# Patient Record
Sex: Female | Born: 1940
Health system: Southern US, Community
[De-identification: ages and names within clinical notes are randomized; demographics above are authoritative.]

## PROBLEM LIST (undated history)

## (undated) DIAGNOSIS — C801 Malignant (primary) neoplasm, unspecified: Secondary | ICD-10-CM

## (undated) DIAGNOSIS — B019 Varicella without complication: Secondary | ICD-10-CM

## (undated) DIAGNOSIS — K219 Gastro-esophageal reflux disease without esophagitis: Secondary | ICD-10-CM

## (undated) DIAGNOSIS — J300 Vasomotor rhinitis: Secondary | ICD-10-CM

## (undated) DIAGNOSIS — IMO0002 Reserved for concepts with insufficient information to code with codable children: Secondary | ICD-10-CM

## (undated) DIAGNOSIS — D649 Anemia, unspecified: Secondary | ICD-10-CM

## (undated) DIAGNOSIS — J40 Bronchitis, not specified as acute or chronic: Secondary | ICD-10-CM

## (undated) DIAGNOSIS — M47816 Spondylosis without myelopathy or radiculopathy, lumbar region: Secondary | ICD-10-CM

## (undated) DIAGNOSIS — E079 Disorder of thyroid, unspecified: Secondary | ICD-10-CM

## (undated) DIAGNOSIS — T7840XA Allergy, unspecified, initial encounter: Secondary | ICD-10-CM

## (undated) DIAGNOSIS — E538 Deficiency of other specified B group vitamins: Secondary | ICD-10-CM

## (undated) HISTORY — DX: Disorder of thyroid, unspecified: E07.9

## (undated) HISTORY — DX: Spondylosis without myelopathy or radiculopathy, lumbar region: M47.816

## (undated) HISTORY — DX: Vasomotor rhinitis: J30.0

## (undated) HISTORY — DX: Reserved for concepts with insufficient information to code with codable children: IMO0002

## (undated) HISTORY — DX: Deficiency of other specified B group vitamins: E53.8

## (undated) HISTORY — DX: Allergy, unspecified, initial encounter: T78.40XA

## (undated) HISTORY — DX: Gastro-esophageal reflux disease without esophagitis: K21.9

## (undated) HISTORY — PX: APPENDECTOMY: SHX54

## (undated) HISTORY — DX: Bronchitis, not specified as acute or chronic: J40

## (undated) HISTORY — PX: ABDOMINAL HYSTERECTOMY: SHX81

## (undated) HISTORY — DX: Varicella without complication: B01.9

## (undated) HISTORY — DX: Malignant (primary) neoplasm, unspecified: C80.1

---

## 2004-02-22 LAB — HM COLONOSCOPY

## 2004-11-29 ENCOUNTER — Ambulatory Visit: Payer: Self-pay | Admitting: Unknown Physician Specialty

## 2005-07-05 ENCOUNTER — Ambulatory Visit: Payer: Self-pay | Admitting: Unknown Physician Specialty

## 2006-02-06 ENCOUNTER — Ambulatory Visit: Payer: Self-pay | Admitting: Unknown Physician Specialty

## 2007-02-10 ENCOUNTER — Ambulatory Visit: Payer: Self-pay | Admitting: Unknown Physician Specialty

## 2007-08-05 ENCOUNTER — Ambulatory Visit: Payer: Self-pay | Admitting: Unknown Physician Specialty

## 2007-08-07 ENCOUNTER — Ambulatory Visit: Payer: Self-pay | Admitting: Unknown Physician Specialty

## 2007-08-25 ENCOUNTER — Ambulatory Visit: Payer: Self-pay | Admitting: Unknown Physician Specialty

## 2008-02-12 ENCOUNTER — Ambulatory Visit: Payer: Self-pay | Admitting: Unknown Physician Specialty

## 2008-09-30 DIAGNOSIS — C801 Malignant (primary) neoplasm, unspecified: Secondary | ICD-10-CM

## 2008-09-30 HISTORY — PX: MODIFIED RADICAL MASTECTOMY W/ AXILLARY LYMPH NODE DISSECTION W/ PECTORALIS MINOR PRESERVATION: SHX2044

## 2008-09-30 HISTORY — DX: Malignant (primary) neoplasm, unspecified: C80.1

## 2009-02-16 ENCOUNTER — Ambulatory Visit: Payer: Self-pay | Admitting: Unknown Physician Specialty

## 2009-02-21 ENCOUNTER — Ambulatory Visit: Payer: Self-pay | Admitting: Unknown Physician Specialty

## 2010-03-01 ENCOUNTER — Ambulatory Visit: Payer: Self-pay

## 2011-01-31 LAB — LIPID PANEL
Cholesterol: 198 mg/dL (ref 0–200)
HDL: 58 mg/dL (ref 35–70)

## 2011-01-31 LAB — CBC AND DIFFERENTIAL
HCT: 40 % (ref 36–46)
Hemoglobin: 13.8 g/dL (ref 12.0–16.0)
Neutrophils Absolute: 67 /uL
Platelets: 278 10*3/uL (ref 150–399)
WBC: 5.5 10^3/mL

## 2011-01-31 LAB — TSH: TSH: 1.64 u[IU]/mL (ref 0.41–5.90)

## 2011-10-23 ENCOUNTER — Ambulatory Visit (INDEPENDENT_AMBULATORY_CARE_PROVIDER_SITE_OTHER): Payer: Medicare Other | Admitting: Internal Medicine

## 2011-10-23 ENCOUNTER — Encounter: Payer: Self-pay | Admitting: Internal Medicine

## 2011-10-23 DIAGNOSIS — Z13820 Encounter for screening for osteoporosis: Secondary | ICD-10-CM | POA: Insufficient documentation

## 2011-10-23 DIAGNOSIS — R05 Cough: Secondary | ICD-10-CM

## 2011-10-23 DIAGNOSIS — Z1211 Encounter for screening for malignant neoplasm of colon: Secondary | ICD-10-CM

## 2011-10-23 DIAGNOSIS — J309 Allergic rhinitis, unspecified: Secondary | ICD-10-CM

## 2011-10-23 DIAGNOSIS — E039 Hypothyroidism, unspecified: Secondary | ICD-10-CM

## 2011-10-23 DIAGNOSIS — M545 Low back pain, unspecified: Secondary | ICD-10-CM | POA: Insufficient documentation

## 2011-10-23 DIAGNOSIS — M5126 Other intervertebral disc displacement, lumbar region: Secondary | ICD-10-CM

## 2011-10-23 DIAGNOSIS — E538 Deficiency of other specified B group vitamins: Secondary | ICD-10-CM | POA: Insufficient documentation

## 2011-10-23 DIAGNOSIS — R059 Cough, unspecified: Secondary | ICD-10-CM

## 2011-10-23 NOTE — Progress Notes (Signed)
Subjective:    Patient ID: Tammy Tate, female    DOB: 04/06/1941, 71 y.o.   MRN: [id number]  HPI Tammy Tate is a 71 yr old heathy female who is here to transfer her primary care from Dr. Francia Greaves of [facility]. She has been a patient of Dr. Lin Givens for over 25 years. She has a history of insomnia and hypothyroidism. Her chief complaint is for persistent cough which is aggravated by nocturnal position. The cough has been present for several weeks. He is accompanied by fevers night sweats and weight loss. She was evaluated by Leanne Chang for sinusitis and treated with omeprazole for presumed reflux. She has been taking the omeprazole  for a little less than one week.  The plan is to refer her for an endoscopy if her cough is not resolved after one month of PPI therapy .   the aggressive nature of this followup is due to a family history of esophageal cancer. She does have a she does have a history of chronic congestion and postnasal drip she has had allergy testing and was pan allergic.   she has had prior trials of Allegra Claritin and Zyrtec without significant relief but has not tried Singulair. Her congestion is worse after eating.   Past Medical History  Diagnosis Date  . Chicken pox   . Thyroid disease   . B12 deficiency   . Cancer 2010    DCIS , right   No current outpatient prescriptions on file prior to visit.     Review of Systems  Constitutional: Negative for fever, chills and unexpected weight change.  HENT: Negative for hearing loss, ear pain, nosebleeds, congestion, sore throat, facial swelling, rhinorrhea, sneezing, mouth sores, trouble swallowing, neck pain, neck stiffness, voice change, postnasal drip, sinus pressure, tinnitus and ear discharge.   Eyes: Negative for pain, discharge, redness and visual disturbance.  Respiratory: Positive for cough. Negative for chest tightness, shortness of breath, wheezing and stridor.   Cardiovascular: Negative for chest pain,  palpitations and leg swelling.  Musculoskeletal: Negative for myalgias and arthralgias.  Skin: Negative for color change and rash.  Neurological: Negative for dizziness, weakness, light-headedness and headaches.  Hematological: Negative for adenopathy.   BP 110/69  Pulse 76  Temp(Src) 97.6 F (36.4 C) (Oral)  Ht 5\' 4"  (1.626 m)  Wt 161 lb 8 oz (73.256 kg)  BMI 27.72 kg/m2  SpO2 99%      Objective:   Physical Exam  Constitutional: She is oriented to person, place, and time. She appears well-developed and well-nourished.       Looks younger than her stated age  HENT:  Mouth/Throat: Oropharynx is clear and moist.  Eyes: EOM are normal. Pupils are equal, round, and reactive to light. No scleral icterus.  Neck: Normal range of motion. Neck supple. No JVD present. No thyromegaly present.  Cardiovascular: Normal rate, regular rhythm, normal heart sounds and intact distal pulses.   Pulmonary/Chest: Effort normal and breath sounds normal.  Abdominal: Soft. Bowel sounds are normal. She exhibits no mass. There is no tenderness.  Musculoskeletal: Normal range of motion. She exhibits no edema.  Lymphadenopathy:    She has no cervical adenopathy.  Neurological: She is alert and oriented to person, place, and time.  Skin: Skin is warm and dry.  Psychiatric: She has a normal mood and affect.       Assessment & Plan:   Screening for osteoporosis Last one done in [address],  Results unknown   Lumbago due to  displacement of intervertebral disc Diagnosed several years ago with confirmation by MRI. She has no history of radiculopathy except when she was receiving manipulative treatment by a chiropractor. She has not had any episode of radiating back pain in over a year and has made behavioral modifications to avoid reinjury.   Cough Prior ENT evaluation suggested reflux as the cause of her cough. She will continue a trial of omeprazole for 4 weeks. Reviewed healthy eating habits including  avoiding eating prior to 2 hours before bedtime and elevating the head of the bed if if possible. If her symptoms continue to with daily omeprazole she is agreeable to endoscopy endoscopic evaluation for biopsy to rule out Barrett's esophagus given her family history of esophageal cancer.  Allergic rhinitis With multiple allergies given prior allergy testing and no previous trial of Singulair we discussed a trial of Singulair. He also discussed waiting until he had some samples given the extensive nature of this medication. We'll also give consideration to diagnosis of asthma given her history of allergies and chronic cough. Singulair may be helpful for both conditions.  Hypothyroidism Managed with brand-name only Synthroid on stable doses for many years. Will continue to repeat TSH on an annual basis sooner if she develops symptoms of uncontrolled thyroid disease.    Updated Medication List Outpatient Encounter Prescriptions as of 71/23/2013  Medication Sig Dispense Refill  . Calcium Carbonate-Vitamin D (CALCIUM 600 + D PO) Take by mouth 2 (two) times daily.      Marland Kitchen levothyroxine (SYNTHROID) 88 MCG tablet Take 88 mcg by mouth daily.      . Multiple Vitamin (MULTIVITAMIN) tablet Take 1 tablet by mouth daily.      . Omega-3 Fatty Acids (OMEGA 3 PO) Take by mouth daily.      Marland Kitchen omeprazole (PRILOSEC) 40 MG capsule Take 40 mg by mouth daily.      Marland Kitchen zolpidem (AMBIEN) 10 MG tablet Take 10 mg by mouth at bedtime as needed.

## 2011-10-23 NOTE — Assessment & Plan Note (Addendum)
Diagnosed several years ago with confirmation by MRI. She has no history of radiculopathy except when she was receiving manipulative treatment by a chiropractor. She has not had any episode of radiating back pain in over a year and has made behavioral modifications to avoid reinjury.

## 2011-10-23 NOTE — Assessment & Plan Note (Signed)
Last one done in [address],  Results unknown

## 2011-10-24 ENCOUNTER — Encounter: Payer: Self-pay | Admitting: Internal Medicine

## 2011-10-24 DIAGNOSIS — R05 Cough: Secondary | ICD-10-CM | POA: Insufficient documentation

## 2011-10-24 DIAGNOSIS — R059 Cough, unspecified: Secondary | ICD-10-CM | POA: Insufficient documentation

## 2011-10-24 DIAGNOSIS — J309 Allergic rhinitis, unspecified: Secondary | ICD-10-CM | POA: Insufficient documentation

## 2011-10-24 NOTE — Assessment & Plan Note (Signed)
Managed with brand-name only Synthroid on stable doses for many years. Will continue to repeat TSH on an annual basis sooner if she develops symptoms of uncontrolled thyroid disease.

## 2011-10-24 NOTE — Assessment & Plan Note (Signed)
Prior ENT evaluation suggested reflux as the cause of her cough. She will continue a trial of omeprazole for 4 weeks. Reviewed healthy eating habits including avoiding eating prior to 2 hours before bedtime and elevating the head of the bed if if possible. If her symptoms continue to with daily omeprazole she is agreeable to endoscopy endoscopic evaluation for biopsy to rule out Barrett's esophagus given her family history of esophageal cancer.

## 2011-10-24 NOTE — Patient Instructions (Signed)
When you return we will give Singulair a trial for your cough and allergic rhinitis.

## 2011-10-24 NOTE — Assessment & Plan Note (Signed)
With multiple allergies given prior allergy testing and no previous trial of Singulair we discussed a trial of Singulair. He also discussed waiting until he had some samples given the extensive nature of this medication. We'll also give consideration to diagnosis of asthma given her history of allergies and chronic cough. Singulair may be helpful for both conditions.

## 2011-11-27 ENCOUNTER — Encounter: Payer: Self-pay | Admitting: Internal Medicine

## 2011-11-28 ENCOUNTER — Encounter: Payer: Self-pay | Admitting: Internal Medicine

## 2011-12-05 ENCOUNTER — Telehealth: Payer: Self-pay | Admitting: *Deleted

## 2011-12-05 NOTE — Telephone Encounter (Signed)
Triage Record Num: [id number] Operator: Revonda Humphrey Patient Name: Tammy Tate Call Date & Time: 12/05/2011 12:19:43PM Patient Phone: [phone] PCP: Duncan Dull Patient Gender: Female PCP Fax : [phone] Patient DOB: [birth year] Practice Name: [facility] Reason for Call: Caller: [place]/Patient; PCP: Duncan Dull; CB#: [phone]; ; ; Call regarding Cough/Congestion; Cough, congestion started on Fri 11/30/2011. Started Muciinex D, Vicks to chest. Frequent cough, not able to work, not sleeping well. Dry hacking cough, body aches. Chest discomfort when coughing, now sinus congestion. Sleeping on 2 pillows now. Just feeling like chest tight. Feels warm but unsure of fever since no thermometer. Gave care information for coughs. Office/Giannis Corpuz says no appt today or tomorrow,, suggested sending to [facility]. Protocol(s) Used: Cough - Adult Recommended Outcome per Protocol: See Provider within 24 hours Reason for Outcome: Recurrent episodes of uncontrolled coughing interfering with ability to carry out usual activities or with normal sleep patterns Care Advice: Call EMS 911 if sudden onset or sudden worsening of breathing problems, struggling to breathe, high pitched noise when breathing in (stridor), unable to speak, grasping at throat, or panic/anxiety because of breathing problems. ~ Increase fluids to 8-12 eight oz (1.6 to 2.4 liters) glasses per day, half of them to be water. Soups, popsicles, fruit juices, non-caffeinated sodas (unless restricting sodium intake), jello, broths, decaf teas, etc. are all okay. Warm fluids can be soothing. ~ 12/05/2011 12:41:52PM Page 1 of 1 [email or web address]

## 2011-12-10 ENCOUNTER — Ambulatory Visit: Payer: Medicare Other | Admitting: Internal Medicine

## 2011-12-16 ENCOUNTER — Other Ambulatory Visit: Payer: Self-pay | Admitting: Internal Medicine

## 2011-12-16 MED ORDER — ZOLPIDEM TARTRATE 10 MG PO TABS: 10.0000 mg | ORAL_TABLET | Freq: Every evening | ORAL | Status: DC | PRN

## 2012-02-19 ENCOUNTER — Ambulatory Visit (INDEPENDENT_AMBULATORY_CARE_PROVIDER_SITE_OTHER)
Admission: RE | Admit: 2012-02-19 | Discharge: 2012-02-19 | Disposition: A | Discharge status: Home / Self Care | Payer: Medicare Other | Source: Ambulatory Visit | Attending: Internal Medicine | Admitting: Internal Medicine

## 2012-02-19 ENCOUNTER — Encounter: Payer: Self-pay | Admitting: Internal Medicine

## 2012-02-19 ENCOUNTER — Ambulatory Visit (INDEPENDENT_AMBULATORY_CARE_PROVIDER_SITE_OTHER): Payer: Medicare Other | Admitting: Internal Medicine

## 2012-02-19 VITALS — BP 126/70 | HR 77 | Temp 98.3°F | Resp 14 | Ht 64.0 in | Wt 164.0 lb

## 2012-02-19 DIAGNOSIS — J309 Allergic rhinitis, unspecified: Secondary | ICD-10-CM

## 2012-02-19 DIAGNOSIS — D0511 Intraductal carcinoma in situ of right breast: Secondary | ICD-10-CM

## 2012-02-19 DIAGNOSIS — Z13220 Encounter for screening for lipoid disorders: Secondary | ICD-10-CM

## 2012-02-19 DIAGNOSIS — Z0000 Encounter for general adult medical examination without abnormal findings: Secondary | ICD-10-CM

## 2012-02-19 DIAGNOSIS — K2100 Gastro-esophageal reflux disease with esophagitis, without bleeding: Secondary | ICD-10-CM

## 2012-02-19 DIAGNOSIS — Z853 Personal history of malignant neoplasm of breast: Secondary | ICD-10-CM

## 2012-02-19 DIAGNOSIS — Z1211 Encounter for screening for malignant neoplasm of colon: Secondary | ICD-10-CM

## 2012-02-19 DIAGNOSIS — R61 Generalized hyperhidrosis: Secondary | ICD-10-CM

## 2012-02-19 DIAGNOSIS — R5383 Other fatigue: Secondary | ICD-10-CM

## 2012-02-19 MED ORDER — ZOLPIDEM TARTRATE 10 MG PO TABS: 10.0000 mg | ORAL_TABLET | Freq: Every evening | ORAL | Status: DC | PRN

## 2012-02-19 NOTE — Progress Notes (Signed)
Patient ID: Tammy Tate, female   DOB: 1940/11/29, 71 y.o.   MRN: [id number] The patient is here for annual Medicare wellness examination and management of other chronic and acute problems. Allergic rhinitis currently a problem, not resolving with allegra. Has not trie d zyrtec an dprior treial of nasal srapsy not effetive.      The risk factors are reflected in the social history.  The roster of all physicians providing medical care to patient - is listed in the Snapshot section of the chart.  Activities of daily living:  The patient is 100% independent in all ADLs: dressing, toileting, feeding as well as independent mobility  Home safety : The patient has smoke detectors in the home. They wear seatbelts.  There are no firearms at home. There is no violence in the home.   There is no risks for hepatitis, STDs or HIV. There is no   history of blood transfusion. They have no travel history to infectious disease endemic areas of the world.  The patient has seen their dentist in the last six month. They have seen their eye doctor in the last year. They admit to slight hearing difficulty with regard to whispered voices and some television programs.  They have deferred audiologic testing in the last year.  They do not  have excessive sun exposure. Discussed the need for sun protection: hats, long sleeves and use of sunscreen if there is significant sun exposure.   Diet: the importance of a healthy diet is discussed. They do have a healthy diet.  The benefits of regular aerobic exercise were discussed. She walks 4 times per week ,  20 minutes.   Depression screen: there are no signs or vegative symptoms of depression- irritability, change in appetite, anhedonia, sadness/tearfullness.  Cognitive assessment: the patient manages all their financial and personal affairs and is actively engaged. They could relate day,date,year and events; recalled 2/3 objects at 3 minutes; performed clock-face test  normally.  The following portions of the patient's history were reviewed and updated as appropriate: allergies, current medications, past family history, past medical history,  past surgical history, past social history  and problem list.  Visual acuity was not assessed per patient preference since she has regular follow up with her ophthalmologist. Hearing and body mass index were assessed and reviewed.   During the course of the visit the patient was educated and counseled about appropriate screening and preventive services including : fall prevention , diabetes screening, nutrition counseling, colorectal cancer screening, and recommended immunizations.     BP 126/70  Pulse 77  Temp(Src) 98.3 F (36.8 C) (Oral)  Resp 14  Ht 5\' 4"  (1.626 m)  Wt 164 lb (74.39 kg)  BMI 28.15 kg/m2  SpO2 96%  BP 126/70  Pulse 77  Temp(Src) 98.3 F (36.8 C) (Oral)  Resp 14  Ht 5\' 4"  (1.626 m)  Wt 164 lb (74.39 kg)  BMI 28.15 kg/m2  SpO2 96%  General Appearance:    Alert, cooperative, no distress, appears stated age  Head:    Normocephalic, without obvious abnormality, atraumatic  Eyes:    PERRL, conjunctiva/corneas clear, EOM's intact, fundi    benign, both eyes  Ears:    Normal TM's and external ear canals, both ears  Nose:   Nares normal, septum midline, mucosa normal, no drainage    or sinus tenderness  Throat:   Lips, mucosa, and tongue normal; teeth and gums normal  Neck:   Supple, symmetrical, trachea midline, no adenopathy;  thyroid:  no enlargement/tenderness/nodules; no carotid   bruit or JVD  Back:     Symmetric, no curvature, ROM normal, no CVA tenderness  Lungs:     Clear to auscultation bilaterally, respirations unlabored  Chest Wall:    Bilateral mastectomy scars with reconstruction.   Heart:    Regular rate and rhythm, S1 and S2 normal, no murmur, rub   or gallop  Breast Exam:     No tenderness, masses,. resconstruction noted.  Abdomen:     Soft, non-tender, bowel sounds  active all four quadrants,    no masses, no organomegaly  Genitalia:    Normal external for age. cervix and uterus surgically absent.     Extremities:   Extremities normal, atraumatic, no cyanosis or edema  Pulses:   2+ and symmetric all extremities  Skin:   Skin color, texture, turgor normal, no rashes or lesions  Lymph nodes:   Cervical, supraclavicular, and axillary nodes normal  Neurologic:   CNII-XII intact, normal strength, sensation and reflexes    Throughout   Assessment and Plan  Routine general medical examination at a health care facility   She is s/p bilateral mastectomy with breast reconstruction.  Her mammogramsare done by Dr. Tyrell Antonio due to history of BRCA.  She is s/p total hysterectomy and has no cervix,.  Pelvic exam was done last year by Dr. Lin Givens, so none was done today.   Night sweats New onset.  No LAD on exan. Will screen with CXR.  exam was normal.   Allergic rhinitis Advised to try zyrtec qhs and simply saline sinus lavage.,  If no imprvement,  Trial of singulair vs pataday eye drops for ocular symptoms   Screening for colon cancer Last colonoscopy was normal 2005 except for diverticulosis,  Repeat due 2015, [place].    Updated Medication List Outpatient Encounter Prescriptions as of 02/19/2012  Medication Sig Dispense Refill  . Calcium Carbonate-Vitamin D (CALCIUM 600 + D PO) Take by mouth 2 (two) times daily.      Marland Kitchen levothyroxine (SYNTHROID) 88 MCG tablet Take 88 mcg by mouth daily.      . Multiple Vitamin (MULTIVITAMIN) tablet Take 1 tablet by mouth daily.      . Omega-3 Fatty Acids (OMEGA 3 PO) Take by mouth daily.      Marland Kitchen zolpidem (AMBIEN) 10 MG tablet Take 1 tablet (10 mg total) by mouth at bedtime as needed.  30 tablet  3  . DISCONTD: zolpidem (AMBIEN) 10 MG tablet Take 1 tablet (10 mg total) by mouth at bedtime as needed.  30 tablet  3  . DISCONTD: omeprazole (PRILOSEC) 40 MG capsule Take 40 mg by mouth daily.

## 2012-02-19 NOTE — Assessment & Plan Note (Addendum)
She is s/p bilateral mastectomy with breast reconstruction.  Her mammogramsare done by Dr. Tyrell Antonio due to history of BRCA.  She is s/p total hysterectomy and has no cervix,.  Pelvic exam was done last year by Dr. Lin Givens, so none was done today.

## 2012-02-19 NOTE — Assessment & Plan Note (Signed)
Advised to try zyrtec qhs and simply saline sinus lavage.,  If no imprvement,  Trial of singulair vs pataday eye drops for ocular symptoms

## 2012-02-19 NOTE — Assessment & Plan Note (Signed)
New onset.  will screen with CXR.  erxam was normal.

## 2012-02-19 NOTE — Patient Instructions (Signed)
Return for fasting labs at your leisure.   I have ordered the chest  X ray to be done at [facility]   Sign up for MyChart to get your labs and other results via e mail.

## 2012-02-22 ENCOUNTER — Encounter: Payer: Self-pay | Admitting: Internal Medicine

## 2012-02-22 DIAGNOSIS — D0511 Intraductal carcinoma in situ of right breast: Secondary | ICD-10-CM | POA: Insufficient documentation

## 2012-02-22 DIAGNOSIS — K2100 Gastro-esophageal reflux disease with esophagitis, without bleeding: Secondary | ICD-10-CM | POA: Insufficient documentation

## 2012-02-22 NOTE — Assessment & Plan Note (Signed)
Last colonoscopy was normal 2005 except for diverticulosis,  Repeat due 2015, [place].

## 2012-02-26 ENCOUNTER — Other Ambulatory Visit (INDEPENDENT_AMBULATORY_CARE_PROVIDER_SITE_OTHER): Payer: Medicare Other | Admitting: *Deleted

## 2012-02-26 DIAGNOSIS — R5381 Other malaise: Secondary | ICD-10-CM

## 2012-02-26 DIAGNOSIS — R5383 Other fatigue: Secondary | ICD-10-CM

## 2012-02-26 DIAGNOSIS — Z79899 Other long term (current) drug therapy: Secondary | ICD-10-CM

## 2012-02-26 DIAGNOSIS — Z13220 Encounter for screening for lipoid disorders: Secondary | ICD-10-CM

## 2012-02-26 LAB — CBC WITH DIFFERENTIAL/PLATELET
Basophils Absolute: 0 10*3/uL (ref 0.0–0.1)
Basophils Relative: 0.3 % (ref 0.0–3.0)
Eosinophils Absolute: 0.2 10*3/uL (ref 0.0–0.7)
Hemoglobin: 14.5 g/dL (ref 12.0–15.0)
Lymphocytes Relative: 19.7 % (ref 12.0–46.0)
MCHC: 33.9 g/dL (ref 30.0–36.0)
Monocytes Relative: 9 % (ref 3.0–12.0)
Neutrophils Relative %: 66.4 % (ref 43.0–77.0)
RBC: 4.24 Mil/uL (ref 3.87–5.11)
RDW: 12.9 % (ref 11.5–14.6)

## 2012-02-26 LAB — LIPID PANEL
Cholesterol: 224 mg/dL — ABNORMAL HIGH (ref 0–200)
Total CHOL/HDL Ratio: 3
Triglycerides: 118 mg/dL (ref 0.0–149.0)

## 2012-02-26 LAB — COMPLETE METABOLIC PANEL WITH GFR
ALT: 17 U/L (ref 0–35)
CO2: 26 mEq/L (ref 19–32)
Calcium: 9.1 mg/dL (ref 8.4–10.5)
Chloride: 105 mEq/L (ref 96–112)
Creat: 0.88 mg/dL (ref 0.50–1.10)
GFR, Est African American: 77 mL/min
GFR, Est Non African American: 67 mL/min
Glucose, Bld: 87 mg/dL (ref 70–99)
Total Bilirubin: 0.5 mg/dL (ref 0.3–1.2)

## 2012-02-26 LAB — TSH: TSH: 1.41 u[IU]/mL (ref 0.35–5.50)

## 2012-02-26 LAB — LDL CHOLESTEROL, DIRECT: Direct LDL: 147.5 mg/dL

## 2012-03-13 ENCOUNTER — Other Ambulatory Visit: Payer: Self-pay | Admitting: Internal Medicine

## 2012-03-13 MED ORDER — LEVOTHYROXINE SODIUM 88 MCG PO TABS: 88.0000 ug | ORAL_TABLET | Freq: Every day | ORAL | Status: DC

## 2012-07-21 ENCOUNTER — Telehealth: Payer: Self-pay | Admitting: Internal Medicine

## 2012-07-21 NOTE — Telephone Encounter (Signed)
Refill request for synthroid 88 mcg tab #30 Sig: take 1 tablet every day

## 2012-07-22 ENCOUNTER — Other Ambulatory Visit: Payer: Self-pay | Admitting: *Deleted

## 2012-07-22 MED ORDER — LEVOTHYROXINE SODIUM 88 MCG PO TABS: 88.0000 ug | ORAL_TABLET | Freq: Every day | ORAL | Status: DC

## 2012-07-22 NOTE — Telephone Encounter (Signed)
rx replied to via fax from pharmacy

## 2012-11-06 ENCOUNTER — Encounter: Payer: Self-pay | Admitting: Internal Medicine

## 2012-11-14 ENCOUNTER — Other Ambulatory Visit: Payer: Self-pay

## 2012-12-24 ENCOUNTER — Other Ambulatory Visit: Payer: Self-pay | Admitting: *Deleted

## 2012-12-25 MED ORDER — ZOLPIDEM TARTRATE 10 MG PO TABS: 10.0000 mg | ORAL_TABLET | Freq: Every evening | ORAL | Status: DC | PRN

## 2012-12-25 NOTE — Telephone Encounter (Signed)
Phoned in.

## 2012-12-25 NOTE — Telephone Encounter (Signed)
30 day with 2 refill only,  Needs  1 yr 30 minute follow up appt scheduled by early May

## 2012-12-25 NOTE — Telephone Encounter (Signed)
Patient has an appointment RX sent 12/25/12

## 2013-02-19 ENCOUNTER — Encounter: Payer: Medicare Other | Admitting: Internal Medicine

## 2013-02-25 ENCOUNTER — Ambulatory Visit (INDEPENDENT_AMBULATORY_CARE_PROVIDER_SITE_OTHER): Payer: Medicare Other | Admitting: Internal Medicine

## 2013-02-25 ENCOUNTER — Encounter: Payer: Self-pay | Admitting: Internal Medicine

## 2013-02-25 VITALS — BP 110/72 | HR 71 | Temp 97.7°F | Resp 16 | Ht 63.0 in | Wt 168.2 lb

## 2013-02-25 DIAGNOSIS — E559 Vitamin D deficiency, unspecified: Secondary | ICD-10-CM

## 2013-02-25 DIAGNOSIS — Z79899 Other long term (current) drug therapy: Secondary | ICD-10-CM

## 2013-02-25 DIAGNOSIS — Z1211 Encounter for screening for malignant neoplasm of colon: Secondary | ICD-10-CM

## 2013-02-25 DIAGNOSIS — E538 Deficiency of other specified B group vitamins: Secondary | ICD-10-CM

## 2013-02-25 DIAGNOSIS — E785 Hyperlipidemia, unspecified: Secondary | ICD-10-CM

## 2013-02-25 DIAGNOSIS — Z0000 Encounter for general adult medical examination without abnormal findings: Secondary | ICD-10-CM

## 2013-02-25 DIAGNOSIS — R590 Localized enlarged lymph nodes: Secondary | ICD-10-CM | POA: Insufficient documentation

## 2013-02-25 NOTE — Assessment & Plan Note (Signed)
Taking a b complex vitamin

## 2013-02-25 NOTE — Progress Notes (Signed)
Patient ID: Tammy Tate, female   DOB: 1940/10/02, 72 y.o.   MRN: [id number]  The patient is here for annual Medicare wellness examination and management of other chronic and acute problems.  Was trreated for severe allergies which caused facial edema .  Treated with pred taper and steroid nasal spray , and antihistamine by Erline Hau     The risk factors are reflected in the social history.  The roster of all physicians providing medical care to patient - is listed in the Snapshot section of the chart.  Activities of daily living:  The patient is 100% independent in all ADLs: dressing, toileting, feeding as well as independent mobility  Home safety : The patient has smoke detectors in the home. They wear seatbelts.  There are no firearms at home. There is no violence in the home.   There is no risks for hepatitis, STDs or HIV. There is no   history of blood transfusion. They have no travel history to infectious disease endemic areas of the world.  The patient has seen their dentist in the last six month. They have seen their eye doctor in the last year. They admit to slight hearing difficulty with regard to whispered voices and some television programs.  They have deferred audiologic testing in the last year.  They do not  have excessive sun exposure. Discussed the need for sun protection: hats, long sleeves and use of sunscreen if there is significant sun exposure.   Diet: the importance of a healthy diet is discussed. They do have a healthy diet.  The benefits of regular aerobic exercise were discussed. She walks 4 times per week ,  20 minutes.   Depression screen: there are no signs or vegative symptoms of depression- irritability, change in appetite, anhedonia, sadness/tearfullness.  Cognitive assessment: the patient manages all their financial and personal affairs and is actively engaged. They could relate day,date,year and events; recalled 2/3 objects at 3 minutes; performed  clock-face test normally.  The following portions of the patient's history were reviewed and updated as appropriate: allergies, current medications, past family history, past medical history,  past surgical history, past social history  and problem list.  Visual acuity was not assessed per patient preference since she has regular follow up with her ophthalmologist. Hearing and body mass index were assessed and reviewed.   During the course of the visit the patient was educated and counseled about appropriate screening and preventive services including : fall prevention , diabetes screening, nutrition counseling, colorectal cancer screening, and recommended immunizations.    Objective  BP 110/72  Pulse 71  Temp(Src) 97.7 F (36.5 C) (Oral)  Resp 16  Ht 5\' 3"  (1.6 m)  Wt 168 lb 4 oz (76.318 kg)  BMI 29.81 kg/m2  SpO2 98%  General Appearance:    Alert, cooperative, no distress, appears stated age  Head:    Normocephalic, without obvious abnormality, atraumatic  Eyes:    PERRL, conjunctiva/corneas clear, EOM's intact, fundi    benign, both eyes  Ears:    Normal TM's and external ear canals, both ears  Nose:   Nares normal, septum midline, mucosa normal, no drainage    or sinus tenderness  Throat:   Lips, mucosa, and tongue normal; teeth and gums normal  Neck:   Supple, symmetrical, trachea midline, no adenopathy;    thyroid:  no enlargement/tenderness/nodules; no carotid   bruit or JVD  Back:     Symmetric, no curvature, ROM normal, no CVA tenderness  Lungs:     Clear to auscultation bilaterally, respirations unlabored  Chest Wall:    No tenderness or deformity   Heart:    Regular rate and rhythm, S1 and S2 normal, no murmur, rub   or gallop  Breast Exam:    No tenderness, masses, or nipple abnormality  Abdomen:     Soft, non-tender, bowel sounds active all four quadrants,    no masses, no organomegaly     Extremities:   Extremities normal, atraumatic, no cyanosis or edema   Pulses:   2+ and symmetric all extremities  Skin:   Skin color, texture, turgor normal, no rashes or lesions  Lymph nodes:   Cervical, supraclavicular, and axillary nodes normal  Neurologic:   CNII-XII intact, normal strength, sensation and reflexes    throughout   Assessment and Plan  B12 deficiency Taking a b complex vitamin   Routine general medical examination at a health care facility Non gyn exam was done today  Several shotty tender LNs were noted in the axillary region side.,  we will recheck in 4 weeks   Axillary lymphadenopathy Will recheck in 4 weeks given recent URI  Screening for colon cancer Colonoscopy due next year by [facility].   Updated Medication List Outpatient Encounter Prescriptions as of 02/25/2013  Medication Sig Dispense Refill  . Calcium Carbonate-Vitamin D (CALCIUM 600 + D PO) Take by mouth 2 (two) times daily.      . fluticasone (FLONASE) 50 MCG/ACT nasal spray Place 2 sprays into the nose daily.      Marland Kitchen levothyroxine (SYNTHROID) 88 MCG tablet Take 1 tablet (88 mcg total) by mouth daily.  30 tablet  7  . Multiple Vitamin (MULTIVITAMIN) tablet Take 1 tablet by mouth daily.      Marland Kitchen olopatadine (PATANOL) 0.1 % ophthalmic solution 1 drop 2 (two) times daily.      . Omega-3 Fatty Acids (OMEGA 3 PO) Take by mouth daily.      Marland Kitchen zolpidem (AMBIEN) 10 MG tablet Take 1 tablet (10 mg total) by mouth at bedtime as needed.  30 tablet  2   No facility-administered encounter medications on file as of 02/25/2013.

## 2013-02-25 NOTE — Assessment & Plan Note (Signed)
Will recheck in 4 weeks given recent URI

## 2013-02-25 NOTE — Assessment & Plan Note (Addendum)
Non gyn exam was done today  Several shotty tender LNs were noted in the axillary region side.,  we will recheck in 4 weeks

## 2013-02-25 NOTE — Assessment & Plan Note (Signed)
Colonoscopy due next year by [facility].

## 2013-02-25 NOTE — Patient Instructions (Addendum)
I would like you to return in 1 month for fasting labs and recheck on your lymph nodes

## 2013-03-30 ENCOUNTER — Ambulatory Visit: Payer: Medicare Other | Admitting: Internal Medicine

## 2013-04-06 ENCOUNTER — Ambulatory Visit (INDEPENDENT_AMBULATORY_CARE_PROVIDER_SITE_OTHER): Payer: Medicare Other | Admitting: Internal Medicine

## 2013-04-06 ENCOUNTER — Encounter: Payer: Self-pay | Admitting: Internal Medicine

## 2013-04-06 VITALS — BP 130/72 | HR 72 | Temp 98.2°F | Resp 14 | Wt 171.0 lb

## 2013-04-06 DIAGNOSIS — D0511 Intraductal carcinoma in situ of right breast: Secondary | ICD-10-CM

## 2013-04-06 DIAGNOSIS — E785 Hyperlipidemia, unspecified: Secondary | ICD-10-CM

## 2013-04-06 DIAGNOSIS — R599 Enlarged lymph nodes, unspecified: Secondary | ICD-10-CM

## 2013-04-06 DIAGNOSIS — Z79899 Other long term (current) drug therapy: Secondary | ICD-10-CM

## 2013-04-06 DIAGNOSIS — D0590 Unspecified type of carcinoma in situ of unspecified breast: Secondary | ICD-10-CM

## 2013-04-06 DIAGNOSIS — E559 Vitamin D deficiency, unspecified: Secondary | ICD-10-CM

## 2013-04-06 DIAGNOSIS — E538 Deficiency of other specified B group vitamins: Secondary | ICD-10-CM

## 2013-04-06 DIAGNOSIS — R590 Localized enlarged lymph nodes: Secondary | ICD-10-CM

## 2013-04-06 DIAGNOSIS — Z9013 Acquired absence of bilateral breasts and nipples: Secondary | ICD-10-CM

## 2013-04-06 DIAGNOSIS — Z9010 Acquired absence of unspecified breast and nipple: Secondary | ICD-10-CM

## 2013-04-06 LAB — CBC WITH DIFFERENTIAL/PLATELET
Basophils Relative: 1.1 % (ref 0.0–3.0)
Eosinophils Relative: 4.1 % (ref 0.0–5.0)
Hemoglobin: 15 g/dL (ref 12.0–15.0)
MCHC: 34.3 g/dL (ref 30.0–36.0)
MCV: 101.4 fl — ABNORMAL HIGH (ref 78.0–100.0)
Monocytes Absolute: 0.7 10*3/uL (ref 0.1–1.0)
Neutro Abs: 4.4 10*3/uL (ref 1.4–7.7)
Neutrophils Relative %: 65.5 % (ref 43.0–77.0)
RBC: 4.33 Mil/uL (ref 3.87–5.11)
WBC: 6.8 10*3/uL (ref 4.5–10.5)

## 2013-04-06 LAB — TSH: TSH: 2.36 u[IU]/mL (ref 0.35–5.50)

## 2013-04-06 LAB — COMPREHENSIVE METABOLIC PANEL
Albumin: 4.3 g/dL (ref 3.5–5.2)
Alkaline Phosphatase: 76 U/L (ref 39–117)
BUN: 20 mg/dL (ref 6–23)
Calcium: 9.5 mg/dL (ref 8.4–10.5)
Chloride: 103 mEq/L (ref 96–112)
Creatinine, Ser: 0.9 mg/dL (ref 0.4–1.2)
Glucose, Bld: 96 mg/dL (ref 70–99)
Potassium: 4.7 mEq/L (ref 3.5–5.1)

## 2013-04-06 LAB — LIPID PANEL
Cholesterol: 281 mg/dL — ABNORMAL HIGH (ref 0–200)
Triglycerides: 168 mg/dL — ABNORMAL HIGH (ref 0.0–149.0)

## 2013-04-06 NOTE — Progress Notes (Signed)
Patient ID: Tammy Tate, female   DOB: December 18, 1940, 72 y.o.   MRN: [id number]  Patient Active Problem List   Diagnosis Date Noted  . Axillary lymphadenopathy 02/25/2013  . Esophagitis, reflux 02/22/2012  . DCIS (ductal carcinoma in situ) of breast, right   . Routine general medical examination at a health care facility 02/19/2012  . Night sweats 02/19/2012  . Cough 10/24/2011  . Allergic rhinitis 10/24/2011  . Screening for osteoporosis 10/23/2011  . Screening for colon cancer 10/23/2011  . Hypothyroidism 10/23/2011  . Lumbago due to displacement of intervertebral disc 10/23/2011  . B12 deficiency     Subjective:  CC:   Chief Complaint  Patient presents with  . Follow-up    HPI:   Tammy Tate  a 72 y.o. female who presents for follow up on left axillary and chest wall Lymphadenopathy appreciated last month in the setting of history of breast cancer and recent episode of severe allergies complicated by URI.       Past Medical History  Diagnosis Date  . Chicken pox   . Thyroid disease   . B12 deficiency   . Allergy   . GERD (gastroesophageal reflux disease)   . Vasomotor rhinitis   . Bronchitis     hx of recurrent and bronchospasm  . Degenerative disk disease     KAM 08-04-2007  . Degenerative arthritis of lumbar spine   . Migraine with aura   . Cancer 2010    DCIS , right    Past Surgical History  Procedure Laterality Date  . Appendectomy    . Modified radical mastectomy w/ axillary lymph node dissection w/ pectoralis minor preservation  2010    bilateral, for DCIS,  Doylene Canning , [facility]  . Abdominal hysterectomy      with prior HRT     The following portions of the patient's history were reviewed and updated as appropriate: Allergies, current medications, and problem list.    Review of Systems:   Patient denies headache, fevers, malaise, unintentional weight loss, skin rash, eye pain, sinus congestion and sinus pain, sore throat, dysphagia,  hemoptysis  , cough, dyspnea, wheezing, chest pain, palpitations, orthopnea, edema, abdominal pain, nausea, melena, diarrhea, constipation, flank pain, dysuria, hematuria, urinary  Frequency, nocturia, numbness, tingling, seizures,  Focal weakness, Loss of consciousness,  Tremor, insomnia, depression, anxiety, and suicidal ideation.         History   Social History  . Marital Status: Married    Spouse Name: N/A    Number of Children: N/A  . Years of Education: N/A   Occupational History  . Not on file.   Social History Main Topics  . Smoking status: Former Smoker    Quit date: 10/23/1971  . Smokeless tobacco: Never Used  . Alcohol Use: 4.2 oz/week    7 Glasses of wine per week     Comment: occasional  . Drug Use: No  . Sexually Active: Not on file   Other Topics Concern  . Not on file   Social History Narrative  . No narrative on file    Objective:  BP 130/72  Pulse 72  Temp(Src) 98.2 F (36.8 C) (Oral)  Resp 14  Wt 171 lb (77.565 kg)  BMI 30.3 kg/m2  SpO2 98%  General appearance: alert, cooperative and appears stated age Ears: normal TM's and external ear canals both ears Throat: lips, mucosa, and tongue normal; teeth and gums normal Neck: no adenopathy, no carotid bruit, supple, symmetrical, trachea midline and  thyroid not enlarged, symmetric, no tenderness/mass/nodules Back: symmetric, no curvature. ROM normal. No CVA tenderness. Lungs: clear to auscultation bilaterally Breasts: bilateral total mastectomies with econstruction , linear dimple on right  Heart: regular rate and rhythm, S1, S2 normal, no murmur, click, rub or gallop Abdomen: soft, non-tender; bowel sounds normal; no masses,  no organomegaly Pulses: 2+ and symmetric Skin: Skin color, texture, turgor normal. No rashes or lesions Lymph nodes: Cervical, supraclavicular, and axillary nodes normal.  Assessment and Plan:  Axillary lymphadenopathy Repeat exam of chest wall and axilla on the left di dnot have  lymphadenopathy.  She is s/p bilateral mastectomy with breast reconstruction    Updated Medication List Outpatient Encounter Prescriptions as of 04/06/2013  Medication Sig Dispense Refill  . Calcium Carbonate-Vitamin D (CALCIUM 600 + D PO) Take by mouth 2 (two) times daily.      . fluticasone (FLONASE) 50 MCG/ACT nasal spray Place 2 sprays into the nose daily.      Marland Kitchen levothyroxine (SYNTHROID) 88 MCG tablet Take 1 tablet (88 mcg total) by mouth daily.  30 tablet  7  . Multiple Vitamin (MULTIVITAMIN) tablet Take 1 tablet by mouth daily.      Marland Kitchen olopatadine (PATANOL) 0.1 % ophthalmic solution 1 drop 2 (two) times daily.      . Omega-3 Fatty Acids (OMEGA 3 PO) Take by mouth daily.      Marland Kitchen zolpidem (AMBIEN) 10 MG tablet Take 1 tablet (10 mg total) by mouth at bedtime as needed.  30 tablet  2   No facility-administered encounter medications on file as of 04/06/2013.

## 2013-04-07 ENCOUNTER — Encounter: Payer: Self-pay | Admitting: Internal Medicine

## 2013-04-07 DIAGNOSIS — Z9013 Acquired absence of bilateral breasts and nipples: Secondary | ICD-10-CM | POA: Insufficient documentation

## 2013-04-07 NOTE — Assessment & Plan Note (Signed)
Repeat exam of chest wall and axilla on the left di dnot have lymphadenopathy.  She is s/p bilateral mastectomy with breast reconstruction

## 2013-05-03 ENCOUNTER — Other Ambulatory Visit: Payer: Self-pay | Admitting: *Deleted

## 2013-05-03 MED ORDER — LEVOTHYROXINE SODIUM 88 MCG PO TABS: 88.0000 ug | ORAL_TABLET | Freq: Every day | ORAL | Status: DC

## 2013-05-05 ENCOUNTER — Other Ambulatory Visit: Payer: Self-pay

## 2013-06-09 ENCOUNTER — Other Ambulatory Visit: Payer: Self-pay | Admitting: *Deleted

## 2013-06-11 ENCOUNTER — Telehealth: Payer: Self-pay | Admitting: Internal Medicine

## 2013-06-11 MED ORDER — ZOLPIDEM TARTRATE 10 MG PO TABS: 10.0000 mg | ORAL_TABLET | Freq: Every evening | ORAL | Status: DC | PRN

## 2013-06-11 NOTE — Telephone Encounter (Signed)
Pt states her pharmacy has not gotten a response from [place] regarding pt's Ambien.  Pt only has 1 pill left and it is the weekend.  [facility].

## 2013-06-11 NOTE — Telephone Encounter (Signed)
Script called to pharmacy as requested. 

## 2013-07-02 ENCOUNTER — Encounter: Payer: Self-pay | Admitting: Internal Medicine

## 2013-07-02 ENCOUNTER — Ambulatory Visit: Payer: Medicare Other | Admitting: Internal Medicine

## 2013-07-05 ENCOUNTER — Ambulatory Visit (INDEPENDENT_AMBULATORY_CARE_PROVIDER_SITE_OTHER): Payer: Medicare Other | Admitting: Internal Medicine

## 2013-07-05 ENCOUNTER — Encounter: Payer: Self-pay | Admitting: Internal Medicine

## 2013-07-05 VITALS — BP 128/70 | HR 60 | Temp 97.6°F | Resp 12 | Ht 64.0 in | Wt 173.8 lb

## 2013-07-05 DIAGNOSIS — E538 Deficiency of other specified B group vitamins: Secondary | ICD-10-CM

## 2013-07-05 DIAGNOSIS — E785 Hyperlipidemia, unspecified: Secondary | ICD-10-CM

## 2013-07-05 DIAGNOSIS — E039 Hypothyroidism, unspecified: Secondary | ICD-10-CM

## 2013-07-05 DIAGNOSIS — R590 Localized enlarged lymph nodes: Secondary | ICD-10-CM

## 2013-07-05 DIAGNOSIS — R61 Generalized hyperhidrosis: Secondary | ICD-10-CM

## 2013-07-05 DIAGNOSIS — M25519 Pain in unspecified shoulder: Secondary | ICD-10-CM

## 2013-07-05 DIAGNOSIS — R599 Enlarged lymph nodes, unspecified: Secondary | ICD-10-CM

## 2013-07-05 DIAGNOSIS — Z79899 Other long term (current) drug therapy: Secondary | ICD-10-CM

## 2013-07-05 LAB — VITAMIN B12: Vitamin B-12: 531 pg/mL (ref 211–911)

## 2013-07-05 LAB — CBC WITH DIFFERENTIAL/PLATELET
Basophils Relative: 0.5 % (ref 0.0–3.0)
Eosinophils Relative: 5.3 % — ABNORMAL HIGH (ref 0.0–5.0)
HCT: 41 % (ref 36.0–46.0)
Lymphs Abs: 1.2 10*3/uL (ref 0.7–4.0)
Monocytes Relative: 10.4 % (ref 3.0–12.0)
Neutrophils Relative %: 63.9 % (ref 43.0–77.0)
Platelets: 260 10*3/uL (ref 150.0–400.0)
RBC: 4.12 Mil/uL (ref 3.87–5.11)
WBC: 5.9 10*3/uL (ref 4.5–10.5)

## 2013-07-05 LAB — LDL CHOLESTEROL, DIRECT: Direct LDL: 127.3 mg/dL

## 2013-07-05 LAB — COMPREHENSIVE METABOLIC PANEL
Albumin: 4 g/dL (ref 3.5–5.2)
CO2: 26 mEq/L (ref 19–32)
Calcium: 9.4 mg/dL (ref 8.4–10.5)
GFR: 70.88 mL/min (ref >60.00)
Glucose, Bld: 93 mg/dL (ref 70–99)
Potassium: 5.1 mEq/L (ref 3.5–5.1)
Sodium: 138 mEq/L (ref 135–145)
Total Protein: 6.5 g/dL (ref 6.0–8.3)

## 2013-07-05 NOTE — Patient Instructions (Addendum)
You can use aleve and tylenol for joint pain . (max tylenol is 2000 mg daily )   Labs today to check for rheumatoid

## 2013-07-05 NOTE — Progress Notes (Signed)
Patient ID: Tammy Tate, female   DOB: 02/07/1941, 72 y.o.   MRN: [id number]   Patient Active Problem List   Diagnosis Date Noted  . Pain in joint, shoulder region 07/06/2013  . S/P bilateral mastectomy 04/07/2013  . Axillary lymphadenopathy 02/25/2013  . Esophagitis, reflux 02/22/2012  . DCIS (ductal carcinoma in situ) of breast, right   . Routine general medical examination at a health care facility 02/19/2012  . Night sweats 02/19/2012  . Allergic rhinitis 10/24/2011  . Screening for osteoporosis 10/23/2011  . Screening for colon cancer 10/23/2011  . Hypothyroidism 10/23/2011  . Lumbago due to displacement of intervertebral disc 10/23/2011  . B12 deficiency     Subjective:  CC:   Chief Complaint  Patient presents with  . Acute Visit    Shoulders , wrist bilateral but right wrist is the worse and both shoulders.    HPI:   Tammy Tate a 72 y.o. female who presents for Follow up on chronic and acute issues. She has had increased insomnia and anxiety since her Grand daughter 37 yrs old was diagnosed with ALL last week.  Chemo started last Wednesday after LP, bone marrow and port  placed.  Coming home today   She has been having Recurrent bilateral plantar foot pain , which is worse in the morning when she sets her foot on the floor. She is notable shoulders and her right wrist also been more painful than usual. She has no history,. She does do a considerable amount of yard work which includes burning rosebushes implanting annuals.    Past Medical History  Diagnosis Date  . Chicken pox   . Thyroid disease   . B12 deficiency   . Allergy   . GERD (gastroesophageal reflux disease)   . Vasomotor rhinitis   . Bronchitis     hx of recurrent and bronchospasm  . Degenerative disk disease     K[facility] 08-04-2007  . Degenerative arthritis of lumbar spine   . Migraine with aura   . Cancer 2010    DCIS , right    Past Surgical History  Procedure Laterality Date  .  Appendectomy    . Modified radical mastectomy w/ axillary lymph node dissection w/ pectoralis minor preservation  2010    bilateral, for DCIS,  Tammy Tate , [facility]  . Abdominal hysterectomy      with prior HRT       The following portions of the patient's history were reviewed and updated as appropriate: Allergies, current medications, and problem list.    Review of Systems:   12 Pt  review of systems was negative except those addressed in the HPI,     History   Social History  . Marital Status: Married    Spouse Name: N/A    Number of Children: N/A  . Years of Education: N/A   Occupational History  . Not on file.   Social History Main Topics  . Smoking status: Former Smoker    Quit date: 10/23/1971  . Smokeless tobacco: Never Used  . Alcohol Use: 4.2 oz/week    7 Glasses of wine per week     Comment: occasional  . Drug Use: No  . Sexual Activity: Not on file   Other Topics Concern  . Not on file   Social History Narrative  . No narrative on file    Objective:  Filed Vitals:   07/05/13 1111  BP: 128/70  Pulse: 60  Temp: 97.6 F (36.4 C)  Resp: 12     General appearance: alert, cooperative and appears stated age Ears: normal TM's and external ear canals both ears Throat: lips, mucosa, and tongue normal; teeth and gums normal Neck: no adenopathy, no carotid bruit, supple, symmetrical, trachea midline and thyroid not enlarged, symmetric, no tenderness/mass/nodules Back: symmetric, no curvature. ROM normal. No CVA tenderness. Lungs: clear to auscultation bilaterally Heart: regular rate and rhythm, S1, S2 normal, no murmur, click, rub or gallop Abdomen: soft, non-tender; bowel sounds normal; no masses,  no organomegaly Pulses: 2+ and symmetric Skin: Skin color, texture, turgor normal. No rashes or lesions Lymph nodes: Cervical, supraclavicular, and axillary nodes normal.  Assessment and Plan:  Pain in joint, shoulder region Her range of motion is  mildly limited by pain which occurs in the deltoid region and the scapular region. Given her multiple joint issues suggested screening for rheumatoid arthritis. Continue use of nonsteroidal anti-inflammatories as needed and prophylaxis for gastric irritation with PPI.  Night sweats She was screened for lymphoma with CBC and chest x-ray. Both were normal.  Axillary lymphadenopathy Resolved by repeat exam of chest wall and axilla on the left  Hypothyroidism Thyroid function is WNL on current dose.  No current changes needed.    Updated Medication List Outpatient Encounter Prescriptions as of 07/05/2013  Medication Sig Dispense Refill  . Calcium Carbonate-Vitamin D (CALCIUM 600 + D PO) Take by mouth 2 (two) times daily.      . fluticasone (FLONASE) 50 MCG/ACT nasal spray Place 2 sprays into the nose daily.      Marland Kitchen levothyroxine (SYNTHROID) 88 MCG tablet Take 1 tablet (88 mcg total) by mouth daily.  30 tablet  11  . Multiple Vitamin (MULTIVITAMIN) tablet Take 1 tablet by mouth daily.      . Omega-3 Fatty Acids (OMEGA 3 PO) Take by mouth daily.      Marland Kitchen zolpidem (AMBIEN) 10 MG tablet Take 1 tablet (10 mg total) by mouth at bedtime as needed.  30 tablet  2  . olopatadine (PATANOL) 0.1 % ophthalmic solution 1 drop 2 (two) times daily.       No facility-administered encounter medications on file as of 07/05/2013.     Orders Placed This Encounter  Procedures  . TSH  . Comprehensive metabolic panel  . CBC with Differential  . LDL cholesterol, direct  . Sedimentation rate  . ANA w/Reflex if Positive  . Vitamin B12    No Follow-up on file.

## 2013-07-06 DIAGNOSIS — M7550 Bursitis of unspecified shoulder: Secondary | ICD-10-CM | POA: Insufficient documentation

## 2013-07-06 LAB — TSH: TSH: 1.3 u[IU]/mL (ref 0.35–5.50)

## 2013-07-06 NOTE — Assessment & Plan Note (Signed)
Thyroid function is WNL on current dose.  No current changes needed.  

## 2013-07-06 NOTE — Assessment & Plan Note (Signed)
She was screened for lymphoma with CBC and chest x-ray. Both were normal.

## 2013-07-06 NOTE — Assessment & Plan Note (Signed)
Her range of motion is mildly limited by pain which occurs in the deltoid region and the scapular region. Given her multiple joint issues suggested screening for rheumatoid arthritis. Continue use of nonsteroidal anti-inflammatories as needed and prophylaxis for gastric irritation with PPI.

## 2013-07-06 NOTE — Assessment & Plan Note (Signed)
Resolved by repeat exam of chest wall and axilla on the left

## 2013-07-07 LAB — ANA W/REFLEX IF POSITIVE
Anti Nuclear Antibody(ANA): POSITIVE — AB
Centromere Ab Screen: <0.2 AI (ref 0.0–0.9)
ENA RNP Ab: 0.5 AI (ref 0.0–0.9)
ENA SM Ab Ser-aCnc: <0.2 AI (ref 0.0–0.9)
ENA SSA (RO) Ab: >8 AI — ABNORMAL HIGH (ref 0.0–0.9)
ENA SSB (LA) Ab: <0.2 AI (ref 0.0–0.9)
dsDNA Ab: 7 IU/mL (ref 0–9)

## 2013-07-08 ENCOUNTER — Encounter: Payer: Self-pay | Admitting: Internal Medicine

## 2013-07-08 NOTE — Addendum Note (Signed)
Addended by: Sherlene Shams on: 07/08/2013 06:01 AM   Modules accepted: Orders

## 2013-09-21 ENCOUNTER — Ambulatory Visit (INDEPENDENT_AMBULATORY_CARE_PROVIDER_SITE_OTHER): Payer: Medicare Other | Admitting: Adult Health

## 2013-09-21 ENCOUNTER — Encounter: Payer: Self-pay | Admitting: Adult Health

## 2013-09-21 VITALS — BP 132/78 | HR 112 | Temp 97.8°F | Resp 16 | Wt 172.0 lb

## 2013-09-21 DIAGNOSIS — J209 Acute bronchitis, unspecified: Secondary | ICD-10-CM

## 2013-09-21 MED ORDER — AZITHROMYCIN 250 MG PO TABS: ORAL_TABLET | ORAL | Status: DC

## 2013-09-21 NOTE — Progress Notes (Signed)
Pre visit review using our clinic review tool, if applicable. No additional management support is needed unless otherwise documented below in the visit note. 

## 2013-09-21 NOTE — Assessment & Plan Note (Signed)
Start azithromycin. Also instructed patient to begin using her Flonase. May take over-the-counter cough medication with guaifenesin. RTC if symptoms are not improved by Friday or sooner if necessary.

## 2013-09-21 NOTE — Progress Notes (Signed)
   Subjective:    Patient ID: Tammy Tate, female    DOB: [birth year], 72 y.o.   MRN: [id number]  HPI Patient is a pleasant 72 year old female who presents to clinic with chest congestion, sinus drainage, body discomfort, cough that began on Sunday. She denies fever, shortness of breath. She reports that her granddaughter has been diagnosed with leukemia and will be undergoing treatment in 2 weeks. She reports that she needs to be available to help her daughter during this time. She has been taking over-the-counter medications for her symptoms. She reports taking Cold-Eeze.   / Current Outpatient Prescriptions on File Prior to Visit  Medication Sig Dispense Refill  . Calcium Carbonate-Vitamin D (CALCIUM 600 + D PO) Take by mouth 2 (two) times daily.      . fluticasone (FLONASE) 50 MCG/ACT nasal spray Place 2 sprays into the nose daily.      Marland Kitchen levothyroxine (SYNTHROID) 88 MCG tablet Take 1 tablet (88 mcg total) by mouth daily.  30 tablet  11  . Multiple Vitamin (MULTIVITAMIN) tablet Take 1 tablet by mouth daily.      Marland Kitchen olopatadine (PATANOL) 0.1 % ophthalmic solution 1 drop 2 (two) times daily.      . Omega-3 Fatty Acids (OMEGA 3 PO) Take by mouth daily.      Marland Kitchen zolpidem (AMBIEN) 10 MG tablet Take 1 tablet (10 mg total) by mouth at bedtime as needed.  30 tablet  2   No current facility-administered medications on file prior to visit.    Review of Systems  Constitutional: Positive for fatigue. Negative for fever and chills.  HENT: Positive for congestion, postnasal drip and sinus pressure. Negative for sore throat.   Respiratory: Positive for cough and chest tightness. Negative for shortness of breath and wheezing.   Cardiovascular: Negative for chest pain.  Gastrointestinal: Positive for nausea.       Nausea 2/2 post nasal drip  Neurological: Negative.        Objective:   Physical Exam  Constitutional: She is oriented to person, place, and time. She appears well-developed and  well-nourished. No distress.  HENT:  Right Ear: External ear normal.  Left Ear: External ear normal.  Erythematous pharynx. No exudate  Cardiovascular: Regular rhythm and normal heart sounds.   Tachycardia  Pulmonary/Chest: Effort normal and breath sounds normal. No respiratory distress. She has no wheezes. She has no rales.  Lymphadenopathy:    She has no cervical adenopathy.  Neurological: She is alert and oriented to person, place, and time.  Psychiatric: She has a normal mood and affect. Her behavior is normal. Judgment and thought content normal.          Assessment & Plan:

## 2013-09-21 NOTE — Patient Instructions (Signed)
  Start the Azithromycin 2 tablets today then 1 tablet daily for the next 4 days.  Take an over the counter cough medication that contains guaifenesin to help liquify secretion and make it easier to get rid of them.  Drink plenty of water.  You can take an over the counter medication for your symptoms.  Restart the flonase.  Call if no improvement by Friday or sooner if necessary.

## 2013-09-24 ENCOUNTER — Ambulatory Visit: Payer: Medicare Other | Admitting: Adult Health

## 2013-09-27 ENCOUNTER — Ambulatory Visit (INDEPENDENT_AMBULATORY_CARE_PROVIDER_SITE_OTHER): Payer: Medicare Other | Admitting: Internal Medicine

## 2013-09-27 ENCOUNTER — Encounter: Payer: Self-pay | Admitting: Internal Medicine

## 2013-09-27 VITALS — BP 110/70 | HR 58 | Temp 97.8°F | Ht 64.0 in | Wt 170.5 lb

## 2013-09-27 DIAGNOSIS — J329 Chronic sinusitis, unspecified: Secondary | ICD-10-CM

## 2013-09-27 DIAGNOSIS — R42 Dizziness and giddiness: Secondary | ICD-10-CM

## 2013-09-27 MED ORDER — CEFDINIR 300 MG PO CAPS: 300.0000 mg | ORAL_CAPSULE | Freq: Two times a day (BID) | ORAL | Status: DC

## 2013-09-27 NOTE — Progress Notes (Signed)
Pre-visit discussion using our clinic review tool. No additional management support is needed unless otherwise documented below in the visit note.  

## 2013-09-27 NOTE — Patient Instructions (Signed)
Afrin nasal spray - 2 sprays each nostril 2x/day for three days and then stop.  Flonase nasal spray - 2 sprays each nostril one time per day.  Do this in the evening.  Mucinex in the am and Robitussin in the evening.

## 2013-09-29 ENCOUNTER — Encounter: Payer: Self-pay | Admitting: Internal Medicine

## 2013-09-29 DIAGNOSIS — J329 Chronic sinusitis, unspecified: Secondary | ICD-10-CM | POA: Insufficient documentation

## 2013-09-29 DIAGNOSIS — R42 Dizziness and giddiness: Secondary | ICD-10-CM | POA: Insufficient documentation

## 2013-09-29 NOTE — Assessment & Plan Note (Signed)
Symptoms and exam as outlined.  Recently evaluated and felt to have acute bronchitis.  Already treated with zpak.  With persistent symptoms and progressive symptoms - now c/w sinusitis.  Treat with omnicef as directed.  Afrin nasal spray as directed for 3 days only.  Flonase nasal spray as directed.  Mucinex in the am and Robitussin in the evening.  Rest.  Fluids.  Explained to her if her symptoms worsened or did not resolve, she was to be revaluated.

## 2013-09-29 NOTE — Assessment & Plan Note (Signed)
Symptoms as outlined.  Not severe.  States she had this occur last year with a sinus infection.  I feel the light headedness is related to the current infection.  Treat as outlined.  Follow closely.  If persistent or worsening problems, she is to be reevaluated.

## 2013-09-29 NOTE — Progress Notes (Signed)
Subjective:    Patient ID: Tammy Tate, female    DOB: [birth year], 72 y.o.   MRN: [id number]  HPI 72 year old female with past history of GERD and hypothyroidism who comes in today as a work in with concerns regarding persistent congestion and sinus pressure.  She saw Raquel Rey on 09/21/13 and was placed on Zpak.  Took this medication.  States she still has some chest congestion, but is loosening.  Has now developed increased sinus pressure and nasal congestion.  Yellow mucus production.  Sore throat.  Some dizziness.  Feels like she is in a barrell.  She is concerned about the persistent infection, because she is going to need to take care of her granddaughter.  States her granddaughter has been diagnosed with ALL and is undergoing treatment.  She is coming home from the hospital next week and she needs to be well (to help take care of her).  She is eating and drinking.  No vomiting.     Past Medical History  Diagnosis Date  . Chicken pox   . Thyroid disease   . B12 deficiency   . Allergy   . GERD (gastroesophageal reflux disease)   . Vasomotor rhinitis   . Bronchitis     hx of recurrent and bronchospasm  . Degenerative disk disease     KAM 08-04-2007  . Degenerative arthritis of lumbar spine   . Migraine with aura   . Cancer 2010    DCIS , right    Current Outpatient Prescriptions on File Prior to Visit  Medication Sig Dispense Refill  . Calcium Carbonate-Vitamin D (CALCIUM 600 + D PO) Take by mouth 2 (two) times daily.      . fluticasone (FLONASE) 50 MCG/ACT nasal spray Place 2 sprays into the nose daily.      Marland Kitchen levothyroxine (SYNTHROID) 88 MCG tablet Take 1 tablet (88 mcg total) by mouth daily.  30 tablet  11  . Multiple Vitamin (MULTIVITAMIN) tablet Take 1 tablet by mouth daily.      Marland Kitchen olopatadine (PATANOL) 0.1 % ophthalmic solution 1 drop 2 (two) times daily.      . Omega-3 Fatty Acids (OMEGA 3 PO) Take by mouth daily.      Marland Kitchen zolpidem (AMBIEN) 10 MG tablet Take 1 tablet  (10 mg total) by mouth at bedtime as needed.  30 tablet  2   No current facility-administered medications on file prior to visit.    Review of Systems Patient denies any significant headache.  Some light headedness as outlined.  No significant dizziness.  Increased sinus pressure and nasal congestion productive of yellow mucus.   No chest pain, tightness or palpitations.  No increased shortness of breath.  Does report the increased cough and congestion as outlined.   No nausea or vomiting.  Tolerating po's.   No bowel change, such as diarrhea.        Objective:   Physical Exam Filed Vitals:   09/27/13 1455  BP: 110/70  Pulse: 58  Temp: 97.8 F (34.77 C)   72 year old female in no acute distress.   HEENT:  Nares- erythematous turbinates.  Some tenderness to palpation over the maxillary sinuses.   Oropharynx - without lesions.  TMs visualized - without erythema.   NECK:  Supple.  Nontender.   HEART:  Appears to be regular. LUNGS:  No crackles or wheezing audible.  Respirations even and unlabored.         Assessment & Plan:

## 2013-12-15 ENCOUNTER — Encounter: Payer: Self-pay | Admitting: Internal Medicine

## 2013-12-15 DIAGNOSIS — E119 Type 2 diabetes mellitus without complications: Secondary | ICD-10-CM

## 2013-12-16 NOTE — Telephone Encounter (Signed)
Per patient e m ail   [employer] required this

## 2013-12-23 ENCOUNTER — Encounter: Payer: Self-pay | Admitting: Emergency Medicine

## 2014-02-03 ENCOUNTER — Other Ambulatory Visit: Payer: Self-pay | Admitting: *Deleted

## 2014-02-03 NOTE — Telephone Encounter (Signed)
Upcoming appt 03/02/14

## 2014-02-07 ENCOUNTER — Other Ambulatory Visit: Payer: Self-pay | Admitting: *Deleted

## 2014-02-07 MED ORDER — ZOLPIDEM TARTRATE 10 MG PO TABS: 10.0000 mg | ORAL_TABLET | Freq: Every evening | ORAL | Status: DC | PRN

## 2014-02-11 ENCOUNTER — Telehealth: Payer: Self-pay | Admitting: *Deleted

## 2014-02-11 DIAGNOSIS — E039 Hypothyroidism, unspecified: Secondary | ICD-10-CM

## 2014-02-11 DIAGNOSIS — R5383 Other fatigue: Secondary | ICD-10-CM

## 2014-02-11 DIAGNOSIS — E785 Hyperlipidemia, unspecified: Secondary | ICD-10-CM

## 2014-02-11 DIAGNOSIS — R5381 Other malaise: Secondary | ICD-10-CM

## 2014-02-11 NOTE — Telephone Encounter (Signed)
Pt is coming in on Tuesday what labs and dX?

## 2014-02-22 ENCOUNTER — Other Ambulatory Visit (INDEPENDENT_AMBULATORY_CARE_PROVIDER_SITE_OTHER): Payer: Commercial Managed Care - HMO

## 2014-02-22 DIAGNOSIS — E039 Hypothyroidism, unspecified: Secondary | ICD-10-CM

## 2014-02-22 DIAGNOSIS — Z1211 Encounter for screening for malignant neoplasm of colon: Secondary | ICD-10-CM

## 2014-02-22 DIAGNOSIS — E785 Hyperlipidemia, unspecified: Secondary | ICD-10-CM

## 2014-02-22 DIAGNOSIS — R5381 Other malaise: Secondary | ICD-10-CM

## 2014-02-22 DIAGNOSIS — R5383 Other fatigue: Principal | ICD-10-CM

## 2014-02-22 LAB — COMPREHENSIVE METABOLIC PANEL
ALBUMIN: 3.7 g/dL (ref 3.5–5.2)
ALT: 20 U/L (ref 0–35)
AST: 24 U/L (ref 0–37)
Alkaline Phosphatase: 62 U/L (ref 39–117)
BUN: 17 mg/dL (ref 6–23)
CALCIUM: 9.1 mg/dL (ref 8.4–10.5)
CHLORIDE: 105 meq/L (ref 96–112)
CO2: 28 meq/L (ref 19–32)
Creatinine, Ser: 1 mg/dL (ref 0.4–1.2)
GFR: 57.2 mL/min — AB (ref >60.00)
GLUCOSE: 95 mg/dL (ref 70–99)
POTASSIUM: 4.5 meq/L (ref 3.5–5.1)
SODIUM: 141 meq/L (ref 135–145)
TOTAL PROTEIN: 6.6 g/dL (ref 6.0–8.3)
Total Bilirubin: 0.8 mg/dL (ref 0.2–1.2)

## 2014-02-22 LAB — CBC WITH DIFFERENTIAL/PLATELET
BASOS PCT: 0.5 % (ref 0.0–3.0)
Basophils Absolute: 0 10*3/uL (ref 0.0–0.1)
EOS ABS: 0.3 10*3/uL (ref 0.0–0.7)
EOS PCT: 4.5 % (ref 0.0–5.0)
HCT: 41.9 % (ref 36.0–46.0)
Hemoglobin: 14.3 g/dL (ref 12.0–15.0)
LYMPHS PCT: 17.1 % (ref 12.0–46.0)
Lymphs Abs: 1.2 10*3/uL (ref 0.7–4.0)
MCHC: 34.2 g/dL (ref 30.0–36.0)
MCV: 100.8 fl — ABNORMAL HIGH (ref 78.0–100.0)
MONO ABS: 0.7 10*3/uL (ref 0.1–1.0)
Monocytes Relative: 10.6 % (ref 3.0–12.0)
Neutro Abs: 4.6 10*3/uL (ref 1.4–7.7)
Neutrophils Relative %: 67.3 % (ref 43.0–77.0)
PLATELETS: 304 10*3/uL (ref 150.0–400.0)
RBC: 4.16 Mil/uL (ref 3.87–5.11)
RDW: 12.8 % (ref 11.5–15.5)
WBC: 6.8 10*3/uL (ref 4.0–10.5)

## 2014-02-22 LAB — LIPID PANEL
Cholesterol: 231 mg/dL — ABNORMAL HIGH (ref 0–200)
HDL: 58.7 mg/dL (ref >39.00)
LDL CALC: 137 mg/dL — AB (ref 0–99)
TRIGLYCERIDES: 178 mg/dL — AB (ref 0.0–149.0)
Total CHOL/HDL Ratio: 4
VLDL: 35.6 mg/dL (ref 0.0–40.0)

## 2014-02-22 LAB — TSH: TSH: 2.23 u[IU]/mL (ref 0.35–4.50)

## 2014-02-24 ENCOUNTER — Encounter: Payer: Self-pay | Admitting: Internal Medicine

## 2014-03-02 ENCOUNTER — Encounter: Payer: Self-pay | Admitting: Internal Medicine

## 2014-03-02 ENCOUNTER — Ambulatory Visit (INDEPENDENT_AMBULATORY_CARE_PROVIDER_SITE_OTHER): Payer: Commercial Managed Care - HMO | Admitting: Internal Medicine

## 2014-03-02 VITALS — BP 118/72 | HR 68 | Temp 97.6°F | Resp 16 | Ht 64.0 in | Wt 168.5 lb

## 2014-03-02 DIAGNOSIS — R599 Enlarged lymph nodes, unspecified: Secondary | ICD-10-CM

## 2014-03-02 DIAGNOSIS — R590 Localized enlarged lymph nodes: Secondary | ICD-10-CM

## 2014-03-02 DIAGNOSIS — D7589 Other specified diseases of blood and blood-forming organs: Secondary | ICD-10-CM

## 2014-03-02 DIAGNOSIS — K2100 Gastro-esophageal reflux disease with esophagitis, without bleeding: Secondary | ICD-10-CM

## 2014-03-02 DIAGNOSIS — M75100 Unspecified rotator cuff tear or rupture of unspecified shoulder, not specified as traumatic: Secondary | ICD-10-CM

## 2014-03-02 DIAGNOSIS — Z1211 Encounter for screening for malignant neoplasm of colon: Secondary | ICD-10-CM

## 2014-03-02 DIAGNOSIS — M7550 Bursitis of unspecified shoulder: Secondary | ICD-10-CM

## 2014-03-02 DIAGNOSIS — Z978 Presence of other specified devices: Secondary | ICD-10-CM

## 2014-03-02 DIAGNOSIS — Z13820 Encounter for screening for osteoporosis: Secondary | ICD-10-CM

## 2014-03-02 DIAGNOSIS — Z9889 Other specified postprocedural states: Secondary | ICD-10-CM

## 2014-03-02 DIAGNOSIS — Z0000 Encounter for general adult medical examination without abnormal findings: Secondary | ICD-10-CM

## 2014-03-02 DIAGNOSIS — E039 Hypothyroidism, unspecified: Secondary | ICD-10-CM

## 2014-03-02 DIAGNOSIS — IMO0002 Reserved for concepts with insufficient information to code with codable children: Secondary | ICD-10-CM

## 2014-03-02 DIAGNOSIS — Z23 Encounter for immunization: Secondary | ICD-10-CM

## 2014-03-02 DIAGNOSIS — E663 Overweight: Secondary | ICD-10-CM

## 2014-03-02 MED ORDER — ZOLPIDEM TARTRATE 10 MG PO TABS: 10.0000 mg | ORAL_TABLET | Freq: Every evening | ORAL | Status: DC | PRN

## 2014-03-02 MED ORDER — AMOXICILLIN 500 MG PO CAPS: ORAL_CAPSULE | ORAL | Status: DC

## 2014-03-02 NOTE — Progress Notes (Signed)
Patient ID: Tammy Tate, female   DOB: June 26, 1941, 73 y.o.   MRN: [id number]   The patient is here for annual Medicare wellness examination and management of other chronic and acute problems.   The risk factors are reflected in the social history.  The roster of all physicians providing medical care to patient - is listed in the Snapshot section of the chart.  Activities of daily living:  The patient is 100% independent in all ADLs: dressing, toileting, feeding as well as independent mobility  Home safety : The patient has smoke detectors in the home. They wear seatbelts.  There are no firearms at home. There is no violence in the home.   There is no risks for hepatitis, STDs or HIV. There is no   history of blood transfusion. They have no travel history to infectious disease endemic areas of the world.  The patient has seen their dentist in the last six month. They have seen their eye doctor in the last year. They admit to slight hearing difficulty with regard to whispered voices and some television programs.  They have deferred audiologic testing in the last year.  They do not  have excessive sun exposure. Discussed the need for sun protection: hats, long sleeves and use of sunscreen if there is significant sun exposure.   Diet: the importance of a healthy diet is discussed. They do have a healthy diet.  The benefits of regular aerobic exercise were discussed. She walks 4 times per week ,  20 minutes.   Depression screen: there are no signs or vegative symptoms of depression- irritability, change in appetite, anhedonia, sadness/tearfullness.  Cognitive assessment: the patient manages all their financial and personal affairs and is actively engaged. They could relate day,date,year and events; recalled 2/3 objects at 3 minutes; performed clock-face test normally.  The following portions of the patient's history were reviewed and updated as appropriate: allergies, current medications, past  family history, past medical history,  past surgical history, past social history  and problem list.  Visual acuity was not assessed per patient preference since she has regular follow up with her ophthalmologist. Hearing and body mass index were assessed and reviewed.   During the course of the visit the patient was educated and counseled about appropriate screening and preventive services including : fall prevention , diabetes screening, nutrition counseling, colorectal cancer screening, and recommended immunizations.    Objective:  BP 118/72  Pulse 68  Temp(Src) 97.6 F (36.4 C) (Oral)  Resp 16  Ht 5\' 4"  (1.626 m)  Wt 168 lb 8 oz (76.431 kg)  BMI 28.91 kg/m2  SpO2 98%  General appearance: alert, cooperative and appears stated age Head: Normocephalic, without obvious abnormality, atraumatic Eyes: conjunctivae/corneas clear. PERRL, EOM's intact. Fundi benign. Ears: normal TM's and external ear canals both ears Nose: Nares normal. Septum midline. Mucosa normal. No drainage or sinus tenderness. Throat: lips, mucosa, and tongue normal; teeth and gums normal Neck: no adenopathy, no carotid bruit, no JVD, supple, symmetrical, trachea midline and thyroid not enlarged, symmetric, no tenderness/mass/nodules Lungs: clear to auscultation bilaterally Breasts: bilateral mastectomies with reconstruction , no masses or tenderness. No axillary adenopathy Heart: regular rate and rhythm, S1, S2 normal, no murmur, click, rub or gallop Abdomen: soft, non-tender; bowel sounds normal; no masses,  no organomegaly Extremities: extremities normal, atraumatic, no cyanosis or edema Pulses: 2+ and symmetric Skin: Skin color, texture, turgor normal. No rashes or lesions Neurologic: Alert and oriented X 3, normal strength and tone. Normal symmetric  reflexes. Normal coordination and gait.   Assessment and Plan:  Esophagitis, reflux diagnosed by endo Nov 2008, controlled with PPI.   asymptomatic  Hypothyroidism Thyroid function is WNL on current dose.  No current changes needed.  Lab Results  Component Value Date   TSH 2.23 02/22/2014       Axillary lymphadenopathy Resolved   Screening for colon cancer She is due for colonoscopy. Referral made   Macrocytosis without anemia Secondary to B12 deficiency,  Reminded to resume supplementation  Screening for osteoporosis Last screening was in 2012 by [facility] rheumatologist [place]  S/P breast reconstruction, bilateral Exam done today .  Patient voiced concerns about transient bacteremia from teeth cleaning and other invasive procedures increasing her risk of cellulitis of the breast and axillar based on the experience of a friend who suffered this phenomenom.  Brief discussion about the risks of benefits of prophylactic abx.   Routine general medical examination at a health care facility Annual comprehensive exam was done including breast exam .  All screenings have been addressed .   Overweight (BMI 25.0-29.9) I have addressed  BMI and recommended a low glycemic index diet utilizing smaller more frequent meals to increase metabolism.  I have also recommended that patient start exercising with a goal of 30 minutes of aerobic exercise a minimum of 5 days per week. Screening for lipid disorders, thyroid and diabetes to be done today.     Updated Medication List Outpatient Encounter Prescriptions as of 03/02/2014  Medication Sig  . Calcium Carbonate-Vitamin D (CALCIUM 600 + D PO) Take by mouth 2 (two) times daily.  . fluticasone (FLONASE) 50 MCG/ACT nasal spray Place 2 sprays into the nose daily.  Marland Kitchen levothyroxine (SYNTHROID) 88 MCG tablet Take 1 tablet (88 mcg total) by mouth daily.  . Multiple Vitamin (MULTIVITAMIN) tablet Take 1 tablet by mouth daily.  Marland Kitchen olopatadine (PATANOL) 0.1 % ophthalmic solution 1 drop 2 (two) times daily.  . Omega-3 Fatty Acids (OMEGA 3 PO) Take by mouth daily.  Marland Kitchen zolpidem (AMBIEN) 10  MG tablet Take 1 tablet (10 mg total) by mouth at bedtime as needed.  . [DISCONTINUED] zolpidem (AMBIEN) 10 MG tablet Take 1 tablet (10 mg total) by mouth at bedtime as needed.  Marland Kitchen amoxicillin (AMOXIL) 500 MG capsule 4 tablets 2 hours prior to procedure  . [DISCONTINUED] cefdinir (OMNICEF) 300 MG capsule Take 1 capsule (300 mg total) by mouth 2 (two) times daily.

## 2014-03-02 NOTE — Progress Notes (Signed)
Pre-visit discussion using our clinic review tool. No additional management support is needed unless otherwise documented below in the visit note.  

## 2014-03-02 NOTE — Patient Instructions (Signed)
You had your annual Medicare wellness exam today  You are due for your colonoscopy  You received the pneumonia vaccine today.  We will repeat your labs in 6 months at your next visit

## 2014-03-05 DIAGNOSIS — E663 Overweight: Secondary | ICD-10-CM | POA: Insufficient documentation

## 2014-03-05 DIAGNOSIS — Z9889 Other specified postprocedural states: Secondary | ICD-10-CM | POA: Insufficient documentation

## 2014-03-05 DIAGNOSIS — D7589 Other specified diseases of blood and blood-forming organs: Secondary | ICD-10-CM | POA: Insufficient documentation

## 2014-03-05 NOTE — Assessment & Plan Note (Signed)
Annual comprehensive exam was done including breast exam .  All screenings have been addressed .

## 2014-03-05 NOTE — Assessment & Plan Note (Signed)
I have addressed  BMI and recommended a low glycemic index diet utilizing smaller more frequent meals to increase metabolism.  I have also recommended that patient start exercising with a goal of 30 minutes of aerobic exercise a minimum of 5 days per week. Screening for lipid disorders, thyroid and diabetes to be done today.   

## 2014-03-05 NOTE — Assessment & Plan Note (Signed)
Secondary to B12 deficiency,  Reminded to resume supplementation

## 2014-03-05 NOTE — Assessment & Plan Note (Signed)
Thyroid function is WNL on current dose.  No current changes needed.  Lab Results  Component Value Date   TSH 2.23 02/22/2014

## 2014-03-05 NOTE — Assessment & Plan Note (Signed)
Resolved

## 2014-03-05 NOTE — Assessment & Plan Note (Addendum)
Last screening was in 2012 by [facility] rheumatologist [place]

## 2014-03-05 NOTE — Assessment & Plan Note (Signed)
She is due for colonoscopy. Referral made

## 2014-03-05 NOTE — Assessment & Plan Note (Signed)
Exam done today .  Patient voiced concerns about transient bacteremia from teeth cleaning and other invasive procedures increasing her risk of cellulitis of the breast and axillar based on the experience of a friend who suffered this phenomenom.  Brief discussion about the risks of benefits of prophylactic abx.

## 2014-03-05 NOTE — Assessment & Plan Note (Addendum)
diagnosed by endo Nov 2008, controlled with PPI.  asymptomatic

## 2014-05-27 ENCOUNTER — Ambulatory Visit: Payer: Self-pay | Admitting: Gastroenterology

## 2014-05-28 ENCOUNTER — Other Ambulatory Visit: Payer: Self-pay | Admitting: Internal Medicine

## 2014-06-07 ENCOUNTER — Encounter: Payer: Self-pay | Admitting: Internal Medicine

## 2014-06-07 LAB — HM COLONOSCOPY

## 2014-06-09 ENCOUNTER — Encounter: Payer: Self-pay | Admitting: *Deleted

## 2014-07-11 ENCOUNTER — Encounter: Payer: Self-pay | Admitting: Internal Medicine

## 2014-10-04 ENCOUNTER — Other Ambulatory Visit: Payer: Self-pay | Admitting: Internal Medicine

## 2014-11-22 ENCOUNTER — Telehealth: Payer: Self-pay | Admitting: Internal Medicine

## 2014-11-22 NOTE — Telephone Encounter (Signed)
fyi

## 2014-11-22 NOTE — Telephone Encounter (Signed)
Patient Name: SCOTTIE STANISH DOB: 01-22-1941 Initial Comment Caller states [place], 74 yrs old; Friday night had excrutiating pain in left temple near hairline; still feels it some, is tender to touch; Nurse Assessment Nurse: Marcelline Deist, RN, Kermit Balo Date/Time (Eastern Time): 11/22/2014 10:33:28 AM Confirm and document reason for call. If symptomatic, describe symptoms. ---Caller states on Friday night she had excrutiating pain in left temple near hairline for about an hour, took an Aleve. She still feels it some, is tender to touch. No head injury. Has the patient traveled out of the country within the last 30 days? ---Not Applicable Does the patient require triage? ---Yes Related visit to physician within the last 2 weeks? ---No Does the PT have any chronic conditions? (i.e. diabetes, asthma, etc.) ---Yes List chronic conditions. ---thyroid Guidelines Guideline Title Affirmed Question Affirmed Notes Headache [1] New headache AND [2] age > 70 Final Disposition User See Physician within [place], RN, Kermit Balo Comments Caller seemed to get a little upset that the technology is getting in the way of the care she used to get directly through her physician. Has not accessed her email recently to forward info. to her PCP. Since schedule was full at [facility], scheduled an appt. for pt. at [facility]. Patient normally sees Dr. Deborra Medina.

## 2014-11-23 ENCOUNTER — Encounter: Payer: Self-pay | Admitting: Family Medicine

## 2014-11-23 ENCOUNTER — Ambulatory Visit (INDEPENDENT_AMBULATORY_CARE_PROVIDER_SITE_OTHER): Payer: Commercial Managed Care - HMO | Admitting: Family Medicine

## 2014-11-23 VITALS — BP 120/70 | HR 61 | Temp 97.6°F | Wt 165.5 lb

## 2014-11-23 DIAGNOSIS — R519 Headache, unspecified: Secondary | ICD-10-CM

## 2014-11-23 DIAGNOSIS — R51 Headache: Secondary | ICD-10-CM

## 2014-11-23 LAB — HIGH SENSITIVITY CRP: CRP, High Sensitivity: 2.16 mg/L (ref 0.000–5.000)

## 2014-11-23 LAB — SEDIMENTATION RATE: Sed Rate: 15 mm/hr (ref 0–22)

## 2014-11-23 NOTE — Assessment & Plan Note (Addendum)
New- mostly resolved. Concerned for temporal arteritis but not at the top of my differential at this point.  Since it has been so many days since initial headache and she has no other red flag symptoms, will order stat SED rate/CRP prior to starting high dose prednisone.  If SED rate elevated, will start immediately.  Also refer immediately to ENT for evaluation/possible TA biopsy. More likely trigeminal neuralgia vs migraine variant and will need to be followed by ENT and her PCP.  Will route to PCP.

## 2014-11-23 NOTE — Progress Notes (Signed)
Subjective:   Patient ID: Tammy Tate, female    DOB: Mar 21, 1941, 74 y.o.   MRN: [id number]  Tammy Tate is a pleasant 74 y.o. year old female pt of Dr. Derrel Nip, new to me, who presents to clinic today with Headache  on 11/23/2014  HPI:  5 days ago, acute onset of left temporal head pain.  "very sharp" in nature and associated with nausea.  No vomiting. No blurred vision. No jaw pain.  No CP or SOB. Has never had anything like this before.  Took Tylenol and it eventually went away.  This morning, head is still sore to touch and she can still "feel where her headache was."  No other focal  Neurological deficits.  Current Outpatient Prescriptions on File Prior to Visit  Medication Sig Dispense Refill  . amoxicillin (AMOXIL) 500 MG capsule 4 tablets 2 hours prior to procedure 4 capsule 0  . Calcium Carbonate-Vitamin D (CALCIUM 600 + D PO) Take by mouth 2 (two) times daily.    . fluticasone (FLONASE) 50 MCG/ACT nasal spray Place 2 sprays into the nose daily.    Marland Kitchen levothyroxine (SYNTHROID, LEVOTHROID) 88 MCG tablet TAKE 1 TABLET EVERY DAY 30 tablet 3  . Multiple Vitamin (MULTIVITAMIN) tablet Take 1 tablet by mouth daily.    Marland Kitchen olopatadine (PATANOL) 0.1 % ophthalmic solution 1 drop 2 (two) times daily.    . Omega-3 Fatty Acids (OMEGA 3 PO) Take by mouth daily.    Marland Kitchen zolpidem (AMBIEN) 10 MG tablet Take 1 tablet (10 mg total) by mouth at bedtime as needed. 30 tablet 2   No current facility-administered medications on file prior to visit.    No Known Allergies  Past Medical History  Diagnosis Date  . Chicken pox   . Thyroid disease   . B12 deficiency   . Allergy   . GERD (gastroesophageal reflux disease)   . Vasomotor rhinitis   . Bronchitis     hx of recurrent and bronchospasm  . Degenerative disk disease     [facility] 08-04-2007  . Degenerative arthritis of lumbar spine   . Migraine with aura   . Cancer 2010    DCIS , right    Past Surgical History  Procedure Laterality Date   . Appendectomy    . Modified radical mastectomy w/ axillary lymph node dissection w/ pectoralis minor preservation  2010    bilateral, for DCIS,  [name] , [facility]  . Abdominal hysterectomy      with prior HRT    Family History  Problem Relation Age of Onset  . Cancer Mother 59    esophgeal CA mets to lungs, spine   . Stroke Father 83    tobacco abuse, hyperlipidemia  . Hyperlipidemia Father   . Heart disease Father     History   Social History  . Marital Status: Married    Spouse Name: N/A  . Number of Children: N/A  . Years of Education: N/A   Occupational History  . Not on file.   Social History Main Topics  . Smoking status: Former Smoker    Quit date: 10/23/1971  . Smokeless tobacco: Never Used  . Alcohol Use: 4.2 oz/week    7 Glasses of wine per week     Comment: occasional  . Drug Use: No  . Sexual Activity: Not on file   Other Topics Concern  . Not on file   Social History Narrative   The PMH, [place], Social History, Family  History, Medications, and allergies have been reviewed in [facility], and have been updated if relevant.  Review of Systems  Constitutional: Negative.   Eyes: Negative.   Neurological: Positive for headaches. Negative for dizziness, tremors, seizures, syncope, facial asymmetry, speech difficulty, weakness, light-headedness and numbness.  All other systems reviewed and are negative.      Objective:    BP 120/70 mmHg  Pulse 61  Temp(Src) 97.6 F (36.4 C) (Oral)  Wt 165 lb 8 oz (75.07 kg)  SpO2 97%   Physical Exam  Constitutional: She is oriented to person, place, and time. She appears well-developed and well-nourished. No distress.  HENT:  Head: Normocephalic and atraumatic.    Eyes: Conjunctivae and EOM are normal.  Cardiovascular: Normal rate and regular rhythm.   Pulmonary/Chest: Effort normal.  Musculoskeletal: Normal range of motion.  Neurological: She is alert and oriented to person, place, and time. No cranial nerve  deficit.          Assessment & Plan:   Left temporal headache No Follow-up on file.

## 2014-11-23 NOTE — Patient Instructions (Signed)
Nice to meet you. I think this is less likely Temporal Arteritis and more likely something called Trigeminal neuralgia or even a migraine variant, but I would like for you to see Dr. Tami Ribas as soon as possible.  I will call you today with your lab results.

## 2014-11-23 NOTE — Progress Notes (Signed)
Pre visit review using our clinic review tool, if applicable. No additional management support is needed unless otherwise documented below in the visit note. 

## 2014-12-27 ENCOUNTER — Encounter: Payer: Self-pay | Admitting: Internal Medicine

## 2014-12-27 ENCOUNTER — Telehealth: Payer: Self-pay | Admitting: Internal Medicine

## 2014-12-27 DIAGNOSIS — Z135 Encounter for screening for eye and ear disorders: Secondary | ICD-10-CM

## 2014-12-27 DIAGNOSIS — C4490 Unspecified malignant neoplasm of skin, unspecified: Secondary | ICD-10-CM

## 2014-12-27 NOTE — Telephone Encounter (Signed)
Patient said she needs a referral for a yearly eye exam with ([place]) and a dermatologist [facility] Theodosia Paling).

## 2014-12-27 NOTE — Telephone Encounter (Signed)
Please advise 

## 2014-12-28 NOTE — Telephone Encounter (Signed)
Referral is in process as requested 

## 2014-12-29 ENCOUNTER — Encounter: Payer: Self-pay | Admitting: Internal Medicine

## 2015-02-09 ENCOUNTER — Encounter: Payer: Self-pay | Admitting: Internal Medicine

## 2015-02-09 ENCOUNTER — Other Ambulatory Visit: Payer: Self-pay

## 2015-02-09 ENCOUNTER — Emergency Department: Payer: Commercial Managed Care - HMO

## 2015-02-09 ENCOUNTER — Telehealth: Payer: Self-pay | Admitting: Internal Medicine

## 2015-02-09 ENCOUNTER — Emergency Department
Admission: EM | Admit: 2015-02-09 | Discharge: 2015-02-09 | Disposition: A | Discharge status: Home / Self Care | Payer: Commercial Managed Care - HMO | Attending: Student | Admitting: Student

## 2015-02-09 DIAGNOSIS — R079 Chest pain, unspecified: Secondary | ICD-10-CM | POA: Diagnosis not present

## 2015-02-09 LAB — CBC
HEMATOCRIT: 42.5 % (ref 35.0–47.0)
HEMOGLOBIN: 14.4 g/dL (ref 12.0–16.0)
MCH: 34.3 pg — ABNORMAL HIGH (ref 26.0–34.0)
MCHC: 33.9 g/dL (ref 32.0–36.0)
MCV: 101.1 fL — ABNORMAL HIGH (ref 80.0–100.0)
Platelets: 291 10*3/uL (ref 150–440)
RBC: 4.2 MIL/uL (ref 3.80–5.20)
RDW: 12.3 % (ref 11.5–14.5)
WBC: 6.3 10*3/uL (ref 3.6–11.0)

## 2015-02-09 LAB — TROPONIN I

## 2015-02-09 LAB — BASIC METABOLIC PANEL
ANION GAP: 6 (ref 5–15)
BUN: 21 mg/dL — ABNORMAL HIGH (ref 6–20)
CALCIUM: 9.1 mg/dL (ref 8.9–10.3)
CO2: 28 mmol/L (ref 22–32)
Chloride: 108 mmol/L (ref 101–111)
Creatinine, Ser: 1.03 mg/dL — ABNORMAL HIGH (ref 0.44–1.00)
GFR calc Af Amer: >60 mL/min (ref >60)
GFR calc non Af Amer: 53 mL/min — ABNORMAL LOW (ref >60)
Glucose, Bld: 88 mg/dL (ref 65–99)
Potassium: 4.4 mmol/L (ref 3.5–5.1)
SODIUM: 142 mmol/L (ref 135–145)

## 2015-02-09 NOTE — Telephone Encounter (Signed)
Patient going to ER.

## 2015-02-09 NOTE — Telephone Encounter (Signed)
Patient Name: Tammy Tate  DOB: [birth year]    Initial Comment Caller states, has a very sharp pain into the top of Rt breast area, She had bilateral mastectomy so it is actually in the chest.    Nurse Assessment  Nurse: Raphael Gibney, RN, Vanita Ingles Date/Time (Eastern Time): 02/09/2015 11:20:15 AM  Confirm and document reason for call. If symptomatic, describe symptoms. ---Caller states she is having sharp pain in her right breast area. Has had bilateral breast mastectomy with breast implants. Pain is in the middle of her breast. She has had pain 15-20 min.  Has the patient traveled out of the country within the last 30 days? ---No  Does the patient require triage? ---Yes  Related visit to physician within the last 2 weeks? ---No  Does the PT have any chronic conditions? (i.e. diabetes, asthma, etc.) ---Yes  List chronic conditions. ---bilateral mastectomy; hypothyroid     Guidelines    Guideline Title Affirmed Question Affirmed Notes  Chest Pain [1] Chest pain lasts > 5 minutes AND [2] age > 86    Final Disposition User   Call EMS 15 Now Raphael Gibney, RN, Vanita Ingles    Comments  Pt states she does not want to call 911 but will have her son take her to the ER.

## 2015-02-09 NOTE — ED Notes (Signed)
Pt c/o sudden onset right sided chest pain, states "I feels like a hot poker" states the pain started around 11am ,...denies any N/V/Diaphoresis or SOB

## 2015-02-10 ENCOUNTER — Other Ambulatory Visit: Payer: Self-pay | Admitting: Internal Medicine

## 2015-02-10 DIAGNOSIS — E559 Vitamin D deficiency, unspecified: Secondary | ICD-10-CM

## 2015-02-10 DIAGNOSIS — E785 Hyperlipidemia, unspecified: Secondary | ICD-10-CM

## 2015-02-10 DIAGNOSIS — E034 Atrophy of thyroid (acquired): Secondary | ICD-10-CM

## 2015-02-10 DIAGNOSIS — R5383 Other fatigue: Secondary | ICD-10-CM

## 2015-02-10 NOTE — Telephone Encounter (Signed)
Sent mychart with need for fasting labs.

## 2015-02-10 NOTE — Telephone Encounter (Signed)
Refill one 30 days only. Needs fasting labs prior to next refill

## 2015-02-10 NOTE — Telephone Encounter (Signed)
Last TSH 02/22/14 ok to fill?

## 2015-02-14 ENCOUNTER — Encounter: Payer: Self-pay | Admitting: Internal Medicine

## 2015-02-20 ENCOUNTER — Encounter: Payer: Self-pay | Admitting: Internal Medicine

## 2015-02-20 ENCOUNTER — Ambulatory Visit (INDEPENDENT_AMBULATORY_CARE_PROVIDER_SITE_OTHER): Payer: Commercial Managed Care - HMO | Admitting: Internal Medicine

## 2015-02-20 VITALS — BP 124/78 | HR 68 | Temp 97.4°F | Resp 16 | Ht 64.0 in | Wt 166.5 lb

## 2015-02-20 DIAGNOSIS — R1011 Right upper quadrant pain: Secondary | ICD-10-CM | POA: Insufficient documentation

## 2015-02-20 MED ORDER — ACYCLOVIR 400 MG PO TABS: 400.0000 mg | ORAL_TABLET | Freq: Every day | ORAL | Status: DC

## 2015-02-20 NOTE — Patient Instructions (Signed)
I am going to rule out cholelithiasis and cholecystitis with an ultrasound,  But I will treat you for shingles

## 2015-02-20 NOTE — Progress Notes (Signed)
Subjective:  Patient ID: Tammy Tate, female    DOB: [birth year]  Age: 74 y.o. MRN: [id number]  CC: The primary encounter diagnosis was Continuous RUQ abdominal pain. A diagnosis of RUQ pain was also pertinent to this visit.  HPI [name] presents for RUQ pain for 10 days deep and painful,  Now getting twinges and a burning pain    Was treated in ER for chest pain , right sided with labs and EKGS on May 12 ,  All normal..  Her pain today is lower,  And in the right CVA .  The pain was quite severe a week ago.  History of shingles  involving  left scalp diagnosed by Anda Latina  never had the blistering rash.  Thinks she may have the same.  However, 2 weeeks ago she had left sided chest pain and was referred to ER with normal workup. Outpatient Prescriptions Prior to Visit  Medication Sig Dispense Refill  . Calcium Carbonate-Vitamin D (CALCIUM 600 + D PO) Take by mouth 2 (two) times daily.    . fluticasone (FLONASE) 50 MCG/ACT nasal spray Place 2 sprays into the nose daily.    Marland Kitchen levothyroxine (SYNTHROID, LEVOTHROID) 88 MCG tablet TAKE 1 TABLET EVERY DAY 30 tablet 0  . Multiple Vitamin (MULTIVITAMIN) tablet Take 1 tablet by mouth daily.    Marland Kitchen olopatadine (PATANOL) 0.1 % ophthalmic solution 1 drop 2 (two) times daily.    . Omega-3 Fatty Acids (OMEGA 3 PO) Take by mouth daily.    . vitamin B-12 (CYANOCOBALAMIN) 1000 MCG tablet Take 1,000 mcg by mouth daily.    Marland Kitchen zolpidem (AMBIEN) 10 MG tablet Take 1 tablet (10 mg total) by mouth at bedtime as needed. 30 tablet 2  . amoxicillin (AMOXIL) 500 MG capsule 4 tablets 2 hours prior to procedure (Patient not taking: Reported on 02/20/2015) 4 capsule 0   No facility-administered medications prior to visit.    Review of Systems;  Patient denies headache, fevers, malaise, unintentional weight loss, skin rash, eye pain, sinus congestion and sinus pain, sore throat, dysphagia,  hemoptysis , cough, dyspnea, wheezing, chest pain, palpitations,  orthopnea, edema, abdominal pain, nausea, melena, diarrhea, constipation, flank pain, dysuria, hematuria, urinary  Frequency, nocturia, numbness, tingling, seizures,  Focal weakness, Loss of consciousness,  Tremor, insomnia, depression, anxiety, and suicidal ideation.      Objective:  BP 124/78 mmHg  Pulse 68  Temp(Src) 97.4 F (36.3 C) (Oral)  Resp 16  Ht 5' 4" (1.626 m)  Wt 166 lb 8 oz (75.524 kg)  BMI 28.57 kg/m2  SpO2 96%  BP Readings from Last 3 Encounters:  02/20/15 124/78  02/09/15 129/75  11/23/14 120/70    Wt Readings from Last 3 Encounters:  02/20/15 166 lb 8 oz (75.524 kg)  02/09/15 160 lb (72.576 kg)  11/23/14 165 lb 8 oz (75.07 kg)    General appearance: alert, cooperative and appears stated age Ears: normal TM's and external ear canals both ears Throat: lips, mucosa, and tongue normal; teeth and gums normal Neck: no adenopathy, no carotid bruit, supple, symmetrical, trachea midline and thyroid not enlarged, symmetric, no tenderness/mass/nodules Back: symmetric, no curvature. ROM normal. No CVA tenderness. Lungs: clear to auscultation bilaterally Heart: regular rate and rhythm, S1, S2 normal, no murmur, click, rub or gallop Abdomen: soft, tender to deep palpation in RUQ, bowel sounds normal; no masses,  no organomegaly Pulses: 2+ and symmetric Skin: Skin color, texture, turgor normal. No rashes or lesions Lymph nodes: Cervical,  supraclavicular, and axillary nodes normal.  No results found for: HGBA1C  Lab Results  Component Value Date   CREATININE 0.88 02/20/2015   CREATININE 1.03* 02/09/2015   CREATININE 1.0 02/22/2014    Lab Results  Component Value Date   WBC 6.3 02/09/2015   HGB 14.4 02/09/2015   HCT 42.5 02/09/2015   PLT 291 02/09/2015   GLUCOSE 95 02/20/2015   CHOL 231* 02/22/2014   TRIG 178.0* 02/22/2014   HDL 58.70 02/22/2014   LDLDIRECT 127.3 07/05/2013   LDLCALC 137* 02/22/2014   ALT 17 02/20/2015   AST 18 02/20/2015   NA 138  02/20/2015   K 4.4 02/20/2015   CL 105 02/20/2015   CREATININE 0.88 02/20/2015   BUN 20 02/20/2015   CO2 26 02/20/2015   TSH 2.23 02/22/2014    No results found.  Assessment & Plan:   Problem List Items Addressed This Visit    Continuous RUQ abdominal pain - Primary    fobt was negative , which was done due to patient's report of black stools. Need to rule ut cholecystectomy with ultrasound.   Lab Results  Component Value Date   ALT 17 02/20/2015   AST 18 02/20/2015   ALKPHOS 68 02/20/2015   BILITOT 0.5 02/20/2015   Lab Results  Component Value Date   WBC 6.3 02/09/2015   HGB 14.4 02/09/2015   HCT 42.5 02/09/2015   MCV 101.1* 02/09/2015   PLT 291 02/09/2015         Relevant Orders   Comp Met (CMET) (Completed)   Sedimentation rate (Completed)   C-reactive protein (Completed)   US Abdomen Limited RUQ   Lipase (Completed)    Other Visit Diagnoses    RUQ pain           I am having Ms. Quashie start on acyclovir. I am also having her maintain her multivitamin, Calcium Carbonate-Vitamin D (CALCIUM 600 + D PO), Omega-3 Fatty Acids (OMEGA 3 PO), fluticasone, olopatadine, amoxicillin, zolpidem, vitamin B-12, levothyroxine, and cetirizine.  Meds ordered this encounter  Medications  . cetirizine (ZYRTEC) 10 MG tablet    Sig: Take 10 mg by mouth daily.  Marland Kitchen acyclovir (ZOVIRAX) 400 MG tablet    Sig: Take 1 tablet (400 mg total) by mouth 5 (five) times daily.    Dispense:  35 tablet    Refill:  0    There are no discontinued medications.  Follow-up: No Follow-up on file.   Crecencio Mc, MD

## 2015-02-20 NOTE — Assessment & Plan Note (Addendum)
fobt was negative , which was done due to patient's report of black stools. Need to rule ut cholecystectomy with ultrasound.   Lab Results  Component Value Date   ALT 17 02/20/2015   AST 18 02/20/2015   ALKPHOS 68 02/20/2015   BILITOT 0.5 02/20/2015   Lab Results  Component Value Date   WBC 6.3 02/09/2015   HGB 14.4 02/09/2015   HCT 42.5 02/09/2015   MCV 101.1* 02/09/2015   PLT 291 02/09/2015

## 2015-02-21 LAB — COMPREHENSIVE METABOLIC PANEL
ALT: 17 U/L (ref 0–35)
AST: 18 U/L (ref 0–37)
Albumin: 4.2 g/dL (ref 3.5–5.2)
Alkaline Phosphatase: 68 U/L (ref 39–117)
BUN: 20 mg/dL (ref 6–23)
CALCIUM: 9.4 mg/dL (ref 8.4–10.5)
CO2: 26 mEq/L (ref 19–32)
CREATININE: 0.88 mg/dL (ref 0.40–1.20)
Chloride: 105 mEq/L (ref 96–112)
GFR: 66.87 mL/min (ref >60.00)
GLUCOSE: 95 mg/dL (ref 70–99)
POTASSIUM: 4.4 meq/L (ref 3.5–5.1)
Sodium: 138 mEq/L (ref 135–145)
TOTAL PROTEIN: 6.6 g/dL (ref 6.0–8.3)
Total Bilirubin: 0.5 mg/dL (ref 0.2–1.2)

## 2015-02-21 LAB — LIPASE: Lipase: 71 U/L — ABNORMAL HIGH (ref 11.0–59.0)

## 2015-02-21 LAB — C-REACTIVE PROTEIN: CRP: 0.2 mg/dL — ABNORMAL LOW (ref 0.5–20.0)

## 2015-02-21 LAB — SEDIMENTATION RATE: Sed Rate: 2 mm/hr (ref 0–22)

## 2015-02-22 ENCOUNTER — Encounter: Payer: Self-pay | Admitting: Internal Medicine

## 2015-03-01 ENCOUNTER — Other Ambulatory Visit (INDEPENDENT_AMBULATORY_CARE_PROVIDER_SITE_OTHER): Payer: Commercial Managed Care - HMO

## 2015-03-01 ENCOUNTER — Ambulatory Visit
Admission: RE | Admit: 2015-03-01 | Discharge: 2015-03-01 | Disposition: A | Discharge status: Home / Self Care | Payer: Commercial Managed Care - HMO | Source: Ambulatory Visit | Attending: Internal Medicine | Admitting: Internal Medicine

## 2015-03-01 DIAGNOSIS — E038 Other specified hypothyroidism: Secondary | ICD-10-CM | POA: Diagnosis not present

## 2015-03-01 DIAGNOSIS — R1011 Right upper quadrant pain: Secondary | ICD-10-CM | POA: Insufficient documentation

## 2015-03-01 DIAGNOSIS — E034 Atrophy of thyroid (acquired): Secondary | ICD-10-CM

## 2015-03-01 DIAGNOSIS — E559 Vitamin D deficiency, unspecified: Secondary | ICD-10-CM

## 2015-03-01 DIAGNOSIS — R5383 Other fatigue: Secondary | ICD-10-CM

## 2015-03-01 DIAGNOSIS — E785 Hyperlipidemia, unspecified: Secondary | ICD-10-CM

## 2015-03-01 LAB — COMPREHENSIVE METABOLIC PANEL
ALBUMIN: 4.1 g/dL (ref 3.5–5.2)
ALT: 19 U/L (ref 0–35)
AST: 20 U/L (ref 0–37)
Alkaline Phosphatase: 64 U/L (ref 39–117)
BILIRUBIN TOTAL: 0.5 mg/dL (ref 0.2–1.2)
BUN: 21 mg/dL (ref 6–23)
CO2: 27 meq/L (ref 19–32)
Calcium: 9.2 mg/dL (ref 8.4–10.5)
Chloride: 105 mEq/L (ref 96–112)
Creatinine, Ser: 0.97 mg/dL (ref 0.40–1.20)
GFR: 59.76 mL/min — ABNORMAL LOW (ref >60.00)
Glucose, Bld: 93 mg/dL (ref 70–99)
POTASSIUM: 4.2 meq/L (ref 3.5–5.1)
Sodium: 138 mEq/L (ref 135–145)
Total Protein: 6.8 g/dL (ref 6.0–8.3)

## 2015-03-01 LAB — LIPID PANEL
CHOLESTEROL: 205 mg/dL — AB (ref 0–200)
HDL: 77.6 mg/dL (ref >39.00)
LDL CALC: 114 mg/dL — AB (ref 0–99)
NonHDL: 127.4
Total CHOL/HDL Ratio: 3
Triglycerides: 65 mg/dL (ref 0.0–149.0)
VLDL: 13 mg/dL (ref 0.0–40.0)

## 2015-03-01 LAB — CBC WITH DIFFERENTIAL/PLATELET
BASOS PCT: 0.5 % (ref 0.0–3.0)
Basophils Absolute: 0 10*3/uL (ref 0.0–0.1)
EOS ABS: 0.3 10*3/uL (ref 0.0–0.7)
EOS PCT: 5.1 % — AB (ref 0.0–5.0)
HCT: 41.6 % (ref 36.0–46.0)
Hemoglobin: 14.5 g/dL (ref 12.0–15.0)
Lymphocytes Relative: 21.3 % (ref 12.0–46.0)
Lymphs Abs: 1.3 10*3/uL (ref 0.7–4.0)
MCHC: 34.7 g/dL (ref 30.0–36.0)
MCV: 100.2 fl — ABNORMAL HIGH (ref 78.0–100.0)
MONO ABS: 0.7 10*3/uL (ref 0.1–1.0)
MONOS PCT: 11.2 % (ref 3.0–12.0)
NEUTROS PCT: 61.9 % (ref 43.0–77.0)
Neutro Abs: 3.7 10*3/uL (ref 1.4–7.7)
Platelets: 286 10*3/uL (ref 150.0–400.0)
RBC: 4.16 Mil/uL (ref 3.87–5.11)
RDW: 12.4 % (ref 11.5–15.5)
WBC: 6.1 10*3/uL (ref 4.0–10.5)

## 2015-03-01 LAB — VITAMIN D 25 HYDROXY (VIT D DEFICIENCY, FRACTURES): VITD: 26.49 ng/mL — ABNORMAL LOW (ref 30.00–100.00)

## 2015-03-01 LAB — TSH: TSH: 1.82 u[IU]/mL (ref 0.35–4.50)

## 2015-03-02 ENCOUNTER — Encounter: Payer: Self-pay | Admitting: Internal Medicine

## 2015-03-09 ENCOUNTER — Ambulatory Visit (INDEPENDENT_AMBULATORY_CARE_PROVIDER_SITE_OTHER): Payer: Commercial Managed Care - HMO | Admitting: Internal Medicine

## 2015-03-09 VITALS — BP 118/70 | HR 72 | Temp 97.8°F | Resp 14 | Ht 64.0 in | Wt 164.8 lb

## 2015-03-09 DIAGNOSIS — R1011 Right upper quadrant pain: Secondary | ICD-10-CM | POA: Diagnosis not present

## 2015-03-09 DIAGNOSIS — E034 Atrophy of thyroid (acquired): Secondary | ICD-10-CM | POA: Diagnosis not present

## 2015-03-09 DIAGNOSIS — Z0000 Encounter for general adult medical examination without abnormal findings: Secondary | ICD-10-CM

## 2015-03-09 DIAGNOSIS — E038 Other specified hypothyroidism: Secondary | ICD-10-CM | POA: Diagnosis not present

## 2015-03-09 MED ORDER — ZOLPIDEM TARTRATE 10 MG PO TABS: 10.0000 mg | ORAL_TABLET | Freq: Every evening | ORAL | Status: DC | PRN

## 2015-03-09 MED ORDER — LEVOTHYROXINE SODIUM 88 MCG PO TABS: 88.0000 ug | ORAL_TABLET | Freq: Every day | ORAL | Status: DC

## 2015-03-09 NOTE — Progress Notes (Signed)
Pre visit review using our clinic review tool, if applicable. No additional management support is needed unless otherwise documented below in the visit note. 

## 2015-03-09 NOTE — Patient Instructions (Signed)
I am ordering plain x rays of your thoracic spine to be done at [facility] to investigate the abdominal pian you are still having   Health Maintenance Adopting a healthy lifestyle and getting preventive care can go a long way to promote health and wellness. Talk with your health care provider about what schedule of regular examinations is right for you. This is a good chance for you to check in with your provider about disease prevention and staying healthy. In between checkups, there are plenty of things you can do on your own. Experts have done a lot of research about which lifestyle changes and preventive measures are most likely to keep you healthy. Ask your health care provider for more information. WEIGHT AND DIET  Eat a healthy diet  Be sure to include plenty of vegetables, fruits, low-fat dairy products, and lean protein.  Do not eat a lot of foods high in solid fats, added sugars, or salt.  Get regular exercise. This is one of the most important things you can do for your health.  Most adults should exercise for at least 150 minutes each week. The exercise should increase your heart rate and make you sweat (moderate-intensity exercise).  Most adults should also do strengthening exercises at least twice a week. This is in addition to the moderate-intensity exercise.  Maintain a healthy weight  Body mass index (BMI) is a measurement that can be used to identify possible weight problems. It estimates body fat based on height and weight. Your health care provider can help determine your BMI and help you achieve or maintain a healthy weight.  For females 38 years of age and older:   A BMI below 18.5 is considered underweight.  A BMI of 18.5 to 24.9 is normal.  A BMI of 25 to 29.9 is considered overweight.  A BMI of 30 and above is considered obese.  Watch levels of cholesterol and blood lipids  You should start having your blood tested for lipids and cholesterol at 74 years of age,  then have this test every 5 years.  You may need to have your cholesterol levels checked more often if:  Your lipid or cholesterol levels are high.  You are older than 74 years of age.  You are at high risk for heart disease.  CANCER SCREENING   Lung Cancer  Lung cancer screening is recommended for adults 64-64 years old who are at high risk for lung cancer because of a history of smoking.  A yearly low-dose CT scan of the lungs is recommended for people who:  Currently smoke.  Have quit within the past 15 years.  Have at least a 30-pack-year history of smoking. A pack year is smoking an average of one pack of cigarettes a day for 1 year.  Yearly screening should continue until it has been 15 years since you quit.  Yearly screening should stop if you develop a health problem that would prevent you from having lung cancer treatment.  Breast Cancer  Practice breast self-awareness. This means understanding how your breasts normally appear and feel.  It also means doing regular breast self-exams. Let your health care provider know about any changes, no matter how small.  If you are in your 20s or 30s, you should have a clinical breast exam (CBE) by a health care provider every 1-3 years as part of a regular health exam.  If you are 47 or older, have a CBE every year. Also consider having a breast X-ray (  mammogram) every year.  If you have a family history of breast cancer, talk to your health care provider about genetic screening.  If you are at high risk for breast cancer, talk to your health care provider about having an MRI and a mammogram every year.  Breast cancer gene (BRCA) assessment is recommended for women who have family members with BRCA-related cancers. BRCA-related cancers include:  Breast.  Ovarian.  Tubal.  Peritoneal cancers.  Results of the assessment will determine the need for genetic counseling and BRCA1 and BRCA2 testing. Cervical  Cancer Routine pelvic examinations to screen for cervical cancer are no longer recommended for nonpregnant women who are considered low risk for cancer of the pelvic organs (ovaries, uterus, and vagina) and who do not have symptoms. A pelvic examination may be necessary if you have symptoms including those associated with pelvic infections. Ask your health care provider if a screening pelvic exam is right for you.   The Pap test is the screening test for cervical cancer for women who are considered at risk.  If you had a hysterectomy for a problem that was not cancer or a condition that could lead to cancer, then you no longer need Pap tests.  If you are older than 65 years, and you have had normal Pap tests for the past 10 years, you no longer need to have Pap tests.  If you have had past treatment for cervical cancer or a condition that could lead to cancer, you need Pap tests and screening for cancer for at least 20 years after your treatment.  If you no longer get a Pap test, assess your risk factors if they change (such as having a new sexual partner). This can affect whether you should start being screened again.  Some women have medical problems that increase their chance of getting cervical cancer. If this is the case for you, your health care provider may recommend more frequent screening and Pap tests.  The human papillomavirus (HPV) test is another test that may be used for cervical cancer screening. The HPV test looks for the virus that can cause cell changes in the cervix. The cells collected during the Pap test can be tested for HPV.  The HPV test can be used to screen women 30 years of age and older. Getting tested for HPV can extend the interval between normal Pap tests from three to five years.  An HPV test also should be used to screen women of any age who have unclear Pap test results.  After 74 years of age, women should have HPV testing as often as Pap tests.  Colorectal  Cancer  This type of cancer can be detected and often prevented.  Routine colorectal cancer screening usually begins at 74 years of age and continues through 75 years of age.  Your health care provider may recommend screening at an earlier age if you have risk factors for colon cancer.  Your health care provider may also recommend using home test kits to check for hidden blood in the stool.  A small camera at the end of a tube can be used to examine your colon directly (sigmoidoscopy or colonoscopy). This is done to check for the earliest forms of colorectal cancer.  Routine screening usually begins at age 50.  Direct examination of the colon should be repeated every 5-10 years through 75 years of age. However, you may need to be screened more often if early forms of precancerous polyps or small growths   are found. Skin Cancer  Check your skin from head to toe regularly.  Tell your health care provider about any new moles or changes in moles, especially if there is a change in a mole's shape or color.  Also tell your health care provider if you have a mole that is larger than the size of a pencil eraser.  Always use sunscreen. Apply sunscreen liberally and repeatedly throughout the day.  Protect yourself by wearing long sleeves, pants, a wide-brimmed hat, and sunglasses whenever you are outside. HEART DISEASE, DIABETES, AND HIGH BLOOD PRESSURE   Have your blood pressure checked at least every 1-2 years. High blood pressure causes heart disease and increases the risk of stroke.  If you are between 55 years and 79 years old, ask your health care provider if you should take aspirin to prevent strokes.  Have regular diabetes screenings. This involves taking a blood sample to check your fasting blood sugar level.  If you are at a normal weight and have a low risk for diabetes, have this test once every three years after 74 years of age.  If you are overweight and have a high risk for  diabetes, consider being tested at a younger age or more often. PREVENTING INFECTION  Hepatitis B  If you have a higher risk for hepatitis B, you should be screened for this virus. You are considered at high risk for hepatitis B if:  You were born in a country where hepatitis B is common. Ask your health care provider which countries are considered high risk.  Your parents were born in a high-risk country, and you have not been immunized against hepatitis B (hepatitis B vaccine).  You have HIV or AIDS.  You use needles to inject street drugs.  You live with someone who has hepatitis B.  You have had sex with someone who has hepatitis B.  You get hemodialysis treatment.  You take certain medicines for conditions, including cancer, organ transplantation, and autoimmune conditions. Hepatitis C  Blood testing is recommended for:  Everyone born from 1945 through 1965.  Anyone with known risk factors for hepatitis C. Sexually transmitted infections (STIs)  You should be screened for sexually transmitted infections (STIs) including gonorrhea and chlamydia if:  You are sexually active and are younger than 74 years of age.  You are older than 74 years of age and your health care provider tells you that you are at risk for this type of infection.  Your sexual activity has changed since you were last screened and you are at an increased risk for chlamydia or gonorrhea. Ask your health care provider if you are at risk.  If you do not have HIV, but are at risk, it may be recommended that you take a prescription medicine daily to prevent HIV infection. This is called pre-exposure prophylaxis (PrEP). You are considered at risk if:  You are sexually active and do not regularly use condoms or know the HIV status of your partner(s).  You take drugs by injection.  You are sexually active with a partner who has HIV. Talk with your health care provider about whether you are at high risk of  being infected with HIV. If you choose to begin PrEP, you should first be tested for HIV. You should then be tested every 3 months for as long as you are taking PrEP.  PREGNANCY   If you are premenopausal and you may become pregnant, ask your health care provider about preconception counseling.    If you may become pregnant, take 400 to 800 micrograms (mcg) of folic acid every day.  If you want to prevent pregnancy, talk to your health care provider about birth control (contraception). OSTEOPOROSIS AND MENOPAUSE   Osteoporosis is a disease in which the bones lose minerals and strength with aging. This can result in serious bone fractures. Your risk for osteoporosis can be identified using a bone density scan.  If you are 65 years of age or older, or if you are at risk for osteoporosis and fractures, ask your health care provider if you should be screened.  Ask your health care provider whether you should take a calcium or vitamin D supplement to lower your risk for osteoporosis.  Menopause may have certain physical symptoms and risks.  Hormone replacement therapy may reduce some of these symptoms and risks. Talk to your health care provider about whether hormone replacement therapy is right for you.  HOME CARE INSTRUCTIONS   Schedule regular health, dental, and eye exams.  Stay current with your immunizations.   Do not use any tobacco products including cigarettes, chewing tobacco, or electronic cigarettes.  If you are pregnant, do not drink alcohol.  If you are breastfeeding, limit how much and how often you drink alcohol.  Limit alcohol intake to no more than 1 drink per day for nonpregnant women. One drink equals 12 ounces of beer, 5 ounces of wine, or 1 ounces of hard liquor.  Do not use street drugs.  Do not share needles.  Ask your health care provider for help if you need support or information about quitting drugs.  Tell your health care provider if you often feel  depressed.  Tell your health care provider if you have ever been abused or do not feel safe at home. Document Released: 04/01/2011 Document Revised: 01/31/2014 Document Reviewed: 08/18/2013 Exit[employer] Patient Information 2015 [employer]. This information is not intended to replace advice given to you by your health care provider. Make sure you discuss any questions you have with your health care provider.  

## 2015-03-09 NOTE — Progress Notes (Signed)
Patient ID: Tammy Tate, female    DOB: Feb 03, 1941  Age: 74 y.o. MRN: [id number]  The patient is here for annual Medicare wellness examination and management of other chronic and acute problems.   The risk factors are reflected in the social history.  The roster of all physicians providing medical care to patient - is listed in the Snapshot section of the chart.  Activities of daily living:  The patient is 100% independent in all ADLs: dressing, toileting, feeding as well as independent mobility  Home safety : The patient has smoke detectors in the home. They wear seatbelts.  There are no firearms at home. There is no violence in the home.   There is no risks for hepatitis, STDs or HIV. There is no   history of blood transfusion. They have no travel history to infectious disease endemic areas of the world.  The patient has seen their dentist in the last six month. They have seen their eye doctor in the last year. They admit to slight hearing difficulty with regard to whispered voices and some television programs.  They have deferred audiologic testing in the last year.  They do not  have excessive sun exposure. Discussed the need for sun protection: hats, long sleeves and use of sunscreen if there is significant sun exposure.   Diet: the importance of a healthy diet is discussed. They do have a healthy diet.  The benefits of regular aerobic exercise were discussed. She walks 4 times per week ,  20 minutes.   Depression screen: there are no signs or vegative symptoms of depression- irritability, change in appetite, anhedonia, sadness/tearfullness.  Cognitive assessment: the patient manages all their financial and personal affairs and is actively engaged. They could relate day,date,year and events; recalled 2/3 objects at 3 minutes; performed clock-face test normally.  The following portions of the patient's history were reviewed and updated as appropriate: allergies, current medications,  past family history, past medical history,  past surgical history, past social history  and problem list.  Visual acuity was not assessed per patient preference since she has regular follow up with her ophthalmologist. Hearing and body mass index were assessed and reviewed.   During the course of the visit the patient was educated and counseled about appropriate screening and preventive services including : fall prevention , diabetes screening, nutrition counseling, colorectal cancer screening, and recommended immunizations.    CC: The primary encounter diagnosis was Abdominal pain, right upper quadrant. Diagnoses of Continuous RUQ abdominal pain, Hypothyroidism due to acquired atrophy of thyroid, and Encounter for Medicare annual wellness exam were also pertinent to this visit.   She continues to have episodes of stabbing pain in the RUQ that are not temporally related to eating or movement. She did not develop any skin rash in this area and completed the acyclovir course for presumed shingles.   History Tammy has a past medical history of Chicken pox; Thyroid disease; B12 deficiency; Allergy; GERD (gastroesophageal reflux disease); Vasomotor rhinitis; Bronchitis; Degenerative disk disease; Degenerative arthritis of lumbar spine; Migraine with aura; and Cancer (2010).   She has past surgical history that includes Appendectomy; Modified radical mastectomy w/ axillary lymph node dissection w/ pectoral minor preservation (2010); and Abdominal hysterectomy.   Her family history includes Cancer (age of onset: 19) in her mother; Heart disease in her father; Hyperlipidemia in her father; Stroke (age of onset: 22) in her father.She reports that she quit smoking about 43 years ago. She has never used smokeless tobacco.  She reports that she drinks about 4.2 oz of alcohol per week. She reports that she does not use illicit drugs.  Outpatient Prescriptions Prior to Visit  Medication Sig Dispense Refill   . Calcium Carbonate-Vitamin D (CALCIUM 600 + D PO) Take by mouth 2 (two) times daily.    . cetirizine (ZYRTEC) 10 MG tablet Take 10 mg by mouth daily.    . fluticasone (FLONASE) 50 MCG/ACT nasal spray Place 2 sprays into the nose daily.    . Multiple Vitamin (MULTIVITAMIN) tablet Take 1 tablet by mouth daily.    Marland Kitchen olopatadine (PATANOL) 0.1 % ophthalmic solution 1 drop 2 (two) times daily.    . Omega-3 Fatty Acids (OMEGA 3 PO) Take by mouth daily.    . vitamin B-12 (CYANOCOBALAMIN) 1000 MCG tablet Take 1,000 mcg by mouth daily.    Marland Kitchen amoxicillin (AMOXIL) 500 MG capsule 4 tablets 2 hours prior to procedure 4 capsule 0  . levothyroxine (SYNTHROID, LEVOTHROID) 88 MCG tablet TAKE 1 TABLET EVERY DAY 30 tablet 0  . zolpidem (AMBIEN) 10 MG tablet Take 1 tablet (10 mg total) by mouth at bedtime as needed. 30 tablet 2  . acyclovir (ZOVIRAX) 400 MG tablet Take 1 tablet (400 mg total) by mouth 5 (five) times daily. (Patient not taking: Reported on 03/09/2015) 35 tablet 0   No facility-administered medications prior to visit.    Review of Systems   Patient denies headache, fevers, malaise, unintentional weight loss, skin rash, eye pain, sinus congestion and sinus pain, sore throat, dysphagia,  hemoptysis , cough, dyspnea, wheezing, chest pain, palpitations, orthopnea, edema, abdominal pain, nausea, melena, diarrhea, constipation, flank pain, dysuria, hematuria, urinary  Frequency, nocturia, numbness, tingling, seizures,  Focal weakness, Loss of consciousness,  Tremor, insomnia, depression, anxiety, and suicidal ideation.      Objective:  BP 118/70 mmHg  Pulse 72  Temp(Src) 97.8 F (36.6 C)  Resp 14  Ht 5\' 4"  (1.626 m)  Wt 164 lb 12.8 oz (74.753 kg)  BMI 28.27 kg/m2  SpO2 96%  Physical Exam  General appearance: alert, cooperative and appears stated age Head: Normocephalic, without obvious abnormality, atraumatic Eyes: conjunctivae/corneas clear. PERRL, EOM's intact. Fundi benign. Ears: normal  TM's and external ear canals both ears Nose: Nares normal. Septum midline. Mucosa normal. No drainage or sinus tenderness. Throat: lips, mucosa, and tongue normal; teeth and gums normal Neck: no adenopathy, no carotid bruit, no JVD, supple, symmetrical, trachea midline and thyroid not enlarged, symmetric, no tenderness/mass/nodules Lungs: clear to auscultation bilaterally Breasts: s/p bilateral mastectomy with reconstruction,  no masses or tenderness Heart: regular rate and rhythm, S1, S2 normal, no murmur, click, rub or gallop Abdomen: soft, non-tender; bowel sounds normal; no masses,  no organomegaly Extremities: extremities normal, atraumatic, no cyanosis or edema Pulses: 2+ and symmetric Skin: Skin color, texture, turgor normal. No rashes or lesions Neurologic: Alert and oriented X 3, normal strength and tone. Normal symmetric reflexes. Normal coordination and gait.   Assessment & Plan:   Problem List Items Addressed This Visit    Hypothyroidism    Thyroid function is WNL on current dose.  No current changes needed.   Lab Results  Component Value Date   TSH 1.82 03/01/2015         Relevant Medications   levothyroxine (SYNTHROID, LEVOTHROID) 88 MCG tablet   Encounter for Medicare annual wellness exam    Annual Medicare wellness  exam was done as well as a comprehensive physical exam and management of acute and chronic conditions .  During the course of the visit the patient was educated and counseled about appropriate screening and preventive services including : fall prevention , diabetes screening, nutrition counseling, colorectal cancer screening, and recommended immunizations.  Printed recommendations for health maintenance screenings was given.       Continuous RUQ abdominal pain    Her ultrasound was normal, and she never developed the classic shingles blistering rash, or any skin changes,  After at least 4 weeks,  Given her history of breast CA,  I am concerned about a mass  in the thoracic spine causing refereed pain to the right upper quadrant.  Plain films have been ordered, and an  MRI of thoracic spine is anticipated.        Other Visit Diagnoses    Abdominal pain, right upper quadrant    -  Primary    Relevant Orders    DG Thoracic Spine W/Swimmers       I have discontinued Ms. Tate's amoxicillin and acyclovir. I have also changed her levothyroxine. Additionally, I am having her maintain her multivitamin, Calcium Carbonate-Vitamin D (CALCIUM 600 + D PO), Omega-3 Fatty Acids (OMEGA 3 PO), fluticasone, olopatadine, vitamin B-12, cetirizine, Vitamin D, and zolpidem.  Meds ordered this encounter  Medications  . Cholecalciferol (VITAMIN D) 2000 UNITS tablet    Sig: Take 2,000 Units by mouth daily.  Marland Kitchen zolpidem (AMBIEN) 10 MG tablet    Sig: Take 1 tablet (10 mg total) by mouth at bedtime as needed.    Dispense:  30 tablet    Refill:  5  . levothyroxine (SYNTHROID, LEVOTHROID) 88 MCG tablet    Sig: Take 1 tablet (88 mcg total) by mouth daily.    Dispense:  30 tablet    Refill:  5    Medications Discontinued During This Encounter  Medication Reason  . acyclovir (ZOVIRAX) 400 MG tablet   . amoxicillin (AMOXIL) 500 MG capsule   . zolpidem (AMBIEN) 10 MG tablet Reorder  . levothyroxine (SYNTHROID, LEVOTHROID) 88 MCG tablet Reorder    Follow-up: No Follow-up on file.   Crecencio Mc, MD

## 2015-03-11 ENCOUNTER — Encounter: Payer: Self-pay | Admitting: Internal Medicine

## 2015-03-11 NOTE — Assessment & Plan Note (Signed)

## 2015-03-11 NOTE — Assessment & Plan Note (Signed)
Thyroid function is WNL on current dose.  No current changes needed.   Lab Results  Component Value Date   TSH 1.82 03/01/2015

## 2015-03-11 NOTE — Assessment & Plan Note (Signed)
Her ultrasound was normal, and she never developed the classic shingles blistering rash, or any skin changes,  After at least 4 weeks,  Given her history of breast CA,  I am concerned about a mass in the thoracic spine causing refereed pain to the right upper quadrant.  Plain films have been ordered, and an  MRI of thoracic spine is anticipated.

## 2015-03-15 ENCOUNTER — Ambulatory Visit
Admission: RE | Admit: 2015-03-15 | Discharge: 2015-03-15 | Disposition: A | Discharge status: Home / Self Care | Payer: Commercial Managed Care - HMO | Source: Ambulatory Visit | Attending: Internal Medicine | Admitting: Internal Medicine

## 2015-03-15 DIAGNOSIS — Z853 Personal history of malignant neoplasm of breast: Secondary | ICD-10-CM | POA: Diagnosis present

## 2015-03-15 DIAGNOSIS — R1011 Right upper quadrant pain: Secondary | ICD-10-CM | POA: Diagnosis present

## 2015-03-15 DIAGNOSIS — M549 Dorsalgia, unspecified: Secondary | ICD-10-CM | POA: Diagnosis present

## 2015-03-15 DIAGNOSIS — M4184 Other forms of scoliosis, thoracic region: Secondary | ICD-10-CM | POA: Insufficient documentation

## 2015-03-17 ENCOUNTER — Encounter: Payer: Self-pay | Admitting: Internal Medicine

## 2015-03-20 ENCOUNTER — Telehealth: Payer: Self-pay

## 2015-03-20 NOTE — Telephone Encounter (Signed)
Called pt about scheduling a mammogram, pt has no breasts

## 2015-09-18 ENCOUNTER — Other Ambulatory Visit: Payer: Self-pay | Admitting: Internal Medicine

## 2015-11-20 ENCOUNTER — Ambulatory Visit (INDEPENDENT_AMBULATORY_CARE_PROVIDER_SITE_OTHER): Payer: Commercial Managed Care - HMO | Admitting: Internal Medicine

## 2015-11-20 ENCOUNTER — Encounter: Payer: Self-pay | Admitting: Internal Medicine

## 2015-11-20 VITALS — BP 120/60 | HR 85 | Temp 97.7°F | Resp 12 | Ht 64.0 in | Wt 169.2 lb

## 2015-11-20 DIAGNOSIS — M130 Polyarthritis, unspecified: Secondary | ICD-10-CM

## 2015-11-20 DIAGNOSIS — M1990 Unspecified osteoarthritis, unspecified site: Secondary | ICD-10-CM | POA: Diagnosis not present

## 2015-11-20 DIAGNOSIS — L600 Ingrowing nail: Secondary | ICD-10-CM

## 2015-11-20 DIAGNOSIS — B029 Zoster without complications: Secondary | ICD-10-CM | POA: Diagnosis not present

## 2015-11-20 MED ORDER — LIDOCAINE 4 % EX LOTN: TOPICAL_LOTION | CUTANEOUS | Status: DC

## 2015-11-20 NOTE — Progress Notes (Signed)
Subjective:  Patient ID: Tammy Tate, female    DOB: [birth year]  Age: 75 y.o. MRN: [id number]  CC: The primary encounter diagnosis was Arthritis. Diagnoses of Shingles rash, Ingrown toenail, and Polyarthritis of hand were also pertinent to this visit.  HPI Tammy Tate presents for painful erythematous rash on posterior has been  calf, present 3 weeks,   Started as a cluster of umbilicated blisters in a ring,  Ring has gotten larger,  And area has become painful.tingling ant itching  2) referral requested for severe arthritis of right hand,  Heberden's nodes on index and middle finger has become too painful to bend   3) needs podiatry for ingrown toenails .  Has been having pain in multiple toes   Outpatient Prescriptions Prior to Visit  Medication Sig Dispense Refill  . Calcium Carbonate-Vitamin D (CALCIUM 600 + D PO) Take by mouth 2 (two) times daily.    . cetirizine (ZYRTEC) 10 MG tablet Take 10 mg by mouth daily.    . Cholecalciferol (VITAMIN D) 2000 UNITS tablet Take 2,000 Units by mouth daily.    . fluticasone (FLONASE) 50 MCG/ACT nasal spray Place 2 sprays into the nose daily.    Marland Kitchen levothyroxine (SYNTHROID, LEVOTHROID) 88 MCG tablet TAKE 1 TABLET EVERY DAY 30 tablet 3  . Multiple Vitamin (MULTIVITAMIN) tablet Take 1 tablet by mouth daily.    Marland Kitchen olopatadine (PATANOL) 0.1 % ophthalmic solution 1 drop 2 (two) times daily.    . Omega-3 Fatty Acids (OMEGA 3 PO) Take by mouth daily.    . vitamin B-12 (CYANOCOBALAMIN) 1000 MCG tablet Take 1,000 mcg by mouth daily.    Marland Kitchen zolpidem (AMBIEN) 10 MG tablet Take 1 tablet (10 mg total) by mouth at bedtime as needed. 30 tablet 5   No facility-administered medications prior to visit.    Review of Systems;  Patient denies headache, fevers, malaise, unintentional weight loss, skin rash, eye pain, sinus congestion and sinus pain, sore throat, dysphagia,  hemoptysis , cough, dyspnea, wheezing, chest pain, palpitations, orthopnea, edema,  abdominal pain, nausea, melena, diarrhea, constipation, flank pain, dysuria, hematuria, urinary  Frequency, nocturia, numbness, tingling, seizures,  Focal weakness, Loss of consciousness,  Tremor, insomnia, depression, anxiety, and suicidal ideation.      Objective:  BP 120/60 mmHg  Pulse 85  Temp(Src) 97.7 F (36.5 C) (Oral)  Resp 12  Ht 5\' 4"  (1.626 m)  Wt 169 lb 4 oz (76.771 kg)  BMI 29.04 kg/m2  SpO2 96%  BP Readings from Last 3 Encounters:  11/20/15 120/60  03/09/15 118/70  02/20/15 124/78    Wt Readings from Last 3 Encounters:  11/20/15 169 lb 4 oz (76.771 kg)  03/09/15 164 lb 12.8 oz (74.753 kg)  02/20/15 166 lb 8 oz (75.524 kg)    General appearance: alert, cooperative and appears stated age Ears: normal TM's and external ear canals both ears Throat: lips, mucosa, and tongue normal; teeth and gums normal Neck: no adenopathy, no carotid bruit, supple, symmetrical, trachea midline and thyroid not enlarged, symmetric, no tenderness/mass/nodules Back: symmetric, no curvature. ROM normal. No CVA tenderness. Lungs: clear to auscultation bilaterally Heart: regular rate and rhythm, S1, S2 normal, no murmur, click, rub or gallop Abdomen: soft, non-tender; bowel sounds normal; no masses,  no organomegaly Pulses: 2+ and symmetric Skin: Skin color, texture, turgor normal. No rashes or lesions Lymph nodes: Cervical, supraclavicular, and axillary nodes normal.  No results found for: HGBA1C  Lab Results  Component Value Date  CREATININE 0.93 11/20/2015   CREATININE 0.97 03/01/2015   CREATININE 0.88 02/20/2015    Lab Results  Component Value Date   WBC 7.5 11/20/2015   HGB 14.4 11/20/2015   HCT 41.3 11/20/2015   PLT 312.0 11/20/2015   GLUCOSE 91 11/20/2015   CHOL 205* 03/01/2015   TRIG 65.0 03/01/2015   HDL 77.60 03/01/2015   LDLDIRECT 127.3 07/05/2013   LDLCALC 114* 03/01/2015   ALT 17 11/20/2015   AST 19 11/20/2015   NA 139 11/20/2015   K 4.3 11/20/2015   CL  104 11/20/2015   CREATININE 0.93 11/20/2015   BUN 20 11/20/2015   CO2 28 11/20/2015   TSH 1.82 03/01/2015    Dg Thoracic Spine W/swimmers  03/15/2015  CLINICAL DATA:  Back pain.  Breast cancer. EXAM: THORACIC SPINE - 2 VIEW + SWIMMERS COMPARISON:  Chest two view 02/09/2015 FINDINGS: Lower thoracic kyphoscoliosis. Negative for fracture. No lytic or sclerotic bone lesion. Disc spaces are maintained. IMPRESSION: Kyphoscoliosis.  No focal bony abnormality. Electronically Signed   By: Franchot Gallo M.D.   On: 03/15/2015 11:17    Assessment & Plan:   Problem List Items Addressed This Visit    Shingles rash    Trial of lidocaine cream      Ingrown toenail    Referral to Dr Milinda Pointer underway per patient request.       Relevant Orders   Ambulatory referral to Podiatry   Polyarthritis of hand    Plain films of hand ordered and serologies to rule out inflammatory causes.  Referral to Dr  Amedeo Plenty.      Relevant Orders   Ambulatory referral to Hand Surgery    Other Visit Diagnoses    Arthritis    -  Primary    Relevant Orders    Sedimentation rate (Completed)    Comprehensive metabolic panel (Completed)    Ferritin (Completed)    CBC with Differential/Platelet (Completed)    Rheumatoid factor    Uric acid (Completed)    C-reactive protein (Completed)    DG Hand Complete Right    Ambulatory referral to Hand Surgery       I am having Ms. Poellnitz start on Lidocaine. I am also having her maintain her multivitamin, Calcium Carbonate-Vitamin D (CALCIUM 600 + D PO), Omega-3 Fatty Acids (OMEGA 3 PO), fluticasone, olopatadine, vitamin B-12, cetirizine, Vitamin D, zolpidem, levothyroxine, and vitamin E.  Meds ordered this encounter  Medications  . vitamin E (VITAMIN E) 400 UNIT capsule    Sig: Take 400 Units by mouth daily.  . Lidocaine 4 % LOTN    Sig: Apply twice daily as needed for shingles pain    Dispense:  88 mL    Refill:  0    There are no discontinued medications.  Follow-up:  No Follow-up on file.   Crecencio Mc, MD

## 2015-11-20 NOTE — Patient Instructions (Signed)
Your calf rash  appears to be a resolving shingles lesion  I have prescribed topical lidocaine  cream to use as needed  I have ordered x rays of your right hand to evaluate the joints  You can use aleve twice daily and tylenol up to 2000 mg daily safely   Referral to Dr Milinda Pointer (podiatry)  and Dr Amedeo Plenty are in process

## 2015-11-20 NOTE — Progress Notes (Signed)
Pre-visit discussion using our clinic review tool. No additional management support is needed unless otherwise documented below in the visit note.  

## 2015-11-21 DIAGNOSIS — B029 Zoster without complications: Secondary | ICD-10-CM | POA: Insufficient documentation

## 2015-11-21 DIAGNOSIS — L600 Ingrowing nail: Secondary | ICD-10-CM | POA: Insufficient documentation

## 2015-11-21 DIAGNOSIS — M130 Polyarthritis, unspecified: Secondary | ICD-10-CM | POA: Insufficient documentation

## 2015-11-21 LAB — CBC WITH DIFFERENTIAL/PLATELET
BASOS ABS: 0 10*3/uL (ref 0.0–0.1)
BASOS PCT: 0.3 % (ref 0.0–3.0)
EOS ABS: 0.5 10*3/uL (ref 0.0–0.7)
Eosinophils Relative: 6.2 % — ABNORMAL HIGH (ref 0.0–5.0)
HEMATOCRIT: 41.3 % (ref 36.0–46.0)
Hemoglobin: 14.4 g/dL (ref 12.0–15.0)
Lymphocytes Relative: 15.9 % (ref 12.0–46.0)
Lymphs Abs: 1.2 10*3/uL (ref 0.7–4.0)
MCHC: 34.8 g/dL (ref 30.0–36.0)
MCV: 99.8 fl (ref 78.0–100.0)
MONOS PCT: 8.1 % (ref 3.0–12.0)
Monocytes Absolute: 0.6 10*3/uL (ref 0.1–1.0)
NEUTROS ABS: 5.2 10*3/uL (ref 1.4–7.7)
Neutrophils Relative %: 69.5 % (ref 43.0–77.0)
PLATELETS: 312 10*3/uL (ref 150.0–400.0)
RBC: 4.14 Mil/uL (ref 3.87–5.11)
RDW: 12.7 % (ref 11.5–15.5)
WBC: 7.5 10*3/uL (ref 4.0–10.5)

## 2015-11-21 LAB — COMPREHENSIVE METABOLIC PANEL
ALT: 17 U/L (ref 0–35)
AST: 19 U/L (ref 0–37)
Albumin: 4.4 g/dL (ref 3.5–5.2)
Alkaline Phosphatase: 65 U/L (ref 39–117)
BUN: 20 mg/dL (ref 6–23)
CALCIUM: 9.6 mg/dL (ref 8.4–10.5)
CO2: 28 meq/L (ref 19–32)
Chloride: 104 mEq/L (ref 96–112)
Creatinine, Ser: 0.93 mg/dL (ref 0.40–1.20)
GFR: 62.61 mL/min (ref >60.00)
GLUCOSE: 91 mg/dL (ref 70–99)
POTASSIUM: 4.3 meq/L (ref 3.5–5.1)
Sodium: 139 mEq/L (ref 135–145)
Total Bilirubin: 0.4 mg/dL (ref 0.2–1.2)
Total Protein: 6.7 g/dL (ref 6.0–8.3)

## 2015-11-21 LAB — C-REACTIVE PROTEIN: CRP: 0.2 mg/dL — ABNORMAL LOW (ref 0.5–20.0)

## 2015-11-21 LAB — SEDIMENTATION RATE: SED RATE: 15 mm/h (ref 0–22)

## 2015-11-21 LAB — URIC ACID: Uric Acid, Serum: 5.3 mg/dL (ref 2.4–7.0)

## 2015-11-21 LAB — FERRITIN: Ferritin: 24 ng/mL (ref 10.0–291.0)

## 2015-11-21 NOTE — Assessment & Plan Note (Signed)
Referral to Dr Milinda Pointer underway per patient request.

## 2015-11-21 NOTE — Assessment & Plan Note (Signed)
Trial of lidocaine cream

## 2015-11-21 NOTE — Assessment & Plan Note (Signed)
Plain films of hand ordered and serologies to rule out inflammatory causes.  Referral to Dr  Amedeo Plenty.

## 2015-11-22 ENCOUNTER — Encounter: Payer: Self-pay | Admitting: Internal Medicine

## 2015-11-22 LAB — RHEUMATOID FACTOR

## 2015-11-23 ENCOUNTER — Encounter: Payer: Self-pay | Admitting: Internal Medicine

## 2015-12-14 ENCOUNTER — Encounter: Payer: Self-pay | Admitting: Internal Medicine

## 2015-12-17 ENCOUNTER — Other Ambulatory Visit: Payer: Self-pay | Admitting: Internal Medicine

## 2015-12-17 DIAGNOSIS — R5383 Other fatigue: Secondary | ICD-10-CM

## 2015-12-17 DIAGNOSIS — Z780 Asymptomatic menopausal state: Secondary | ICD-10-CM

## 2015-12-17 DIAGNOSIS — Z7289 Other problems related to lifestyle: Secondary | ICD-10-CM

## 2015-12-17 DIAGNOSIS — E785 Hyperlipidemia, unspecified: Secondary | ICD-10-CM

## 2015-12-20 ENCOUNTER — Other Ambulatory Visit: Payer: Self-pay | Admitting: Internal Medicine

## 2015-12-20 DIAGNOSIS — R21 Rash and other nonspecific skin eruption: Secondary | ICD-10-CM

## 2015-12-20 MED ORDER — TRIAMCINOLONE ACETONIDE 0.1 % EX CREA: 1.0000 "application " | TOPICAL_CREAM | Freq: Two times a day (BID) | CUTANEOUS | Status: DC

## 2015-12-22 ENCOUNTER — Telehealth: Payer: Self-pay | Admitting: Internal Medicine

## 2015-12-22 ENCOUNTER — Ambulatory Visit: Payer: Self-pay | Admitting: Sports Medicine

## 2015-12-22 DIAGNOSIS — R21 Rash and other nonspecific skin eruption: Secondary | ICD-10-CM

## 2015-12-22 NOTE — Telephone Encounter (Signed)
Her regular dermatologist cannot see her until June ([place])

## 2015-12-22 NOTE — Telephone Encounter (Signed)
Sent referral to [facility] in [place]. They have better availability. Pt is aware.

## 2015-12-26 ENCOUNTER — Encounter: Payer: Self-pay | Admitting: Sports Medicine

## 2015-12-26 ENCOUNTER — Ambulatory Visit (INDEPENDENT_AMBULATORY_CARE_PROVIDER_SITE_OTHER): Payer: Commercial Managed Care - HMO | Admitting: Sports Medicine

## 2015-12-26 DIAGNOSIS — M79674 Pain in right toe(s): Secondary | ICD-10-CM

## 2015-12-26 DIAGNOSIS — D7589 Other specified diseases of blood and blood-forming organs: Secondary | ICD-10-CM | POA: Insufficient documentation

## 2015-12-26 DIAGNOSIS — E039 Hypothyroidism, unspecified: Secondary | ICD-10-CM | POA: Insufficient documentation

## 2015-12-26 DIAGNOSIS — M79675 Pain in left toe(s): Secondary | ICD-10-CM

## 2015-12-26 DIAGNOSIS — L600 Ingrowing nail: Secondary | ICD-10-CM | POA: Diagnosis not present

## 2015-12-26 DIAGNOSIS — J40 Bronchitis, not specified as acute or chronic: Secondary | ICD-10-CM | POA: Insufficient documentation

## 2015-12-26 DIAGNOSIS — E785 Hyperlipidemia, unspecified: Secondary | ICD-10-CM | POA: Insufficient documentation

## 2015-12-26 DIAGNOSIS — M1990 Unspecified osteoarthritis, unspecified site: Secondary | ICD-10-CM | POA: Insufficient documentation

## 2015-12-26 DIAGNOSIS — K219 Gastro-esophageal reflux disease without esophagitis: Secondary | ICD-10-CM | POA: Insufficient documentation

## 2015-12-26 DIAGNOSIS — N6019 Diffuse cystic mastopathy of unspecified breast: Secondary | ICD-10-CM | POA: Insufficient documentation

## 2015-12-26 DIAGNOSIS — G43109 Migraine with aura, not intractable, without status migrainosus: Secondary | ICD-10-CM | POA: Insufficient documentation

## 2015-12-26 NOTE — Progress Notes (Signed)
Patient ID: Tammy Tate, female   DOB: [birth year], 75 y.o.   MRN: [id number] Subjective: Tammy Tate is a 75 y.o. female patient seen today in office with complaint of painful toenails at right and left 1st and 2nd toes; reports that her nails grow out and curl into the skin which can be bothersome at times; present for >20 yrs; trims them and uses cotton with relief; patient desires to discuss treatment options. Patient denies history of Diabetes, Neuropathy, or Vascular disease. Patient has no other pedal complaints at this time.   Patient Active Problem List   Diagnosis Date Noted  . Bronchitis 12/26/2015  . Bloodgood disease 12/26/2015  . Acid reflux 12/26/2015  . HLD (hyperlipidemia) 12/26/2015  . Adult hypothyroidism 12/26/2015  . Increased MCV 12/26/2015  . Acute onset aura migraine 12/26/2015  . Arthritis, degenerative 12/26/2015  . Shingles rash 11/21/2015  . Ingrown toenail 11/21/2015  . Polyarthritis of hand 11/21/2015  . Continuous RUQ abdominal pain 02/20/2015  . Macrocytosis without anemia 03/05/2014  . S/P breast reconstruction, bilateral 03/05/2014  . Overweight (BMI 25.0-29.9) 03/05/2014  . Subacromial bursitis 07/06/2013  . S/P bilateral mastectomy 04/07/2013  . Axillary lymphadenopathy 02/25/2013  . Esophagitis, reflux 02/22/2012  . DCIS (ductal carcinoma in situ) of breast, right   . Encounter for Medicare annual wellness exam 02/19/2012  . Allergic rhinitis 10/24/2011  . Screening for osteoporosis 10/23/2011  . Screening for colon cancer 10/23/2011  . Hypothyroidism 10/23/2011  . Lumbago due to displacement of intervertebral disc 10/23/2011  . B12 deficiency     Current Outpatient Prescriptions on File Prior to Visit  Medication Sig Dispense Refill  . Calcium Carbonate-Vitamin D (CALCIUM 600 + D PO) Take by mouth 2 (two) times daily.    . cetirizine (ZYRTEC) 10 MG tablet Take 10 mg by mouth daily.    . Cholecalciferol (VITAMIN D) 2000 UNITS tablet  Take 2,000 Units by mouth daily.    . fluticasone (FLONASE) 50 MCG/ACT nasal spray Place 2 sprays into the nose daily.    Marland Kitchen levothyroxine (SYNTHROID, LEVOTHROID) 88 MCG tablet TAKE 1 TABLET EVERY DAY 30 tablet 3  . Lidocaine 4 % LOTN Apply twice daily as needed for shingles pain 88 mL 0  . Multiple Vitamin (MULTIVITAMIN) tablet Take 1 tablet by mouth daily.    Marland Kitchen olopatadine (PATANOL) 0.1 % ophthalmic solution 1 drop 2 (two) times daily.    . Omega-3 Fatty Acids (OMEGA 3 PO) Take by mouth daily.    Marland Kitchen triamcinolone cream (KENALOG) 0.1 % Apply 1 application topically 2 (two) times daily. 30 g 0  . vitamin B-12 (CYANOCOBALAMIN) 1000 MCG tablet Take 1,000 mcg by mouth daily.    . vitamin E (VITAMIN E) 400 UNIT capsule Take 400 Units by mouth daily.    Marland Kitchen zolpidem (AMBIEN) 10 MG tablet Take 1 tablet (10 mg total) by mouth at bedtime as needed. 30 tablet 5   No current facility-administered medications on file prior to visit.    No Known Allergies  Objective: Physical Exam  General: Well developed, nourished, no acute distress, awake, alert and oriented x 3  Vascular: Dorsalis pedis artery 2/4 bilateral, Posterior tibial artery 1/4 bilateral, skin temperature warm to warm proximal to distal bilateral lower extremities, mild varicosities, scant pedal hair present bilateral.  Neurological: Gross sensation present via light touch bilateral.   Dermatological: Skin is warm, dry, and supple bilateral, Nails at bilateral hallux and 2nd toes with mild ingrowing with no signs of infection  with pincher shape to nail bed, there is mild dystrophy present at left hallux nail, all other nails are not as severely mishapened, no webspace macerations present bilateral, no open lesions present bilateral, no callus/corns/hyperkeratotic tissue present bilateral. No signs of infection bilateral.  Musculoskeletal: Minimal pain to palpation of nails . Muscular strength within normal limits without painon range of  motion. No pain with calf compression bilateral.  Assessment and Plan:  Problem List Items Addressed This Visit    None    Visit Diagnoses    Ingrowing nail    -  Primary    Toe pain, bilateral           -Examined patient.  -Discussed treatment options for painful ingrowing nails -Patient opted for debridement/removing of ingrowing nail margins -Mechanically debrided and reduced offending nails with sterile nail nipper and dremel nail file without incident. -Educated patient on how to care for and trim her nails by following the natural curvature of nail -Advised patient that if continues to re-cur may benefit from PNA in the future; patient expressed understanding ans stated that she would like to hold off on any procedures because of her busy/active lifestyle.  -Patient to return as needed for follow up evaluation or sooner if symptoms worsen.  Landis Martins, DPM

## 2016-01-15 ENCOUNTER — Other Ambulatory Visit: Payer: Self-pay | Admitting: Internal Medicine

## 2016-01-17 ENCOUNTER — Ambulatory Visit
Admission: RE | Admit: 2016-01-17 | Discharge: 2016-01-17 | Disposition: A | Discharge status: Home / Self Care | Payer: Commercial Managed Care - HMO | Source: Ambulatory Visit | Attending: Internal Medicine | Admitting: Internal Medicine

## 2016-01-17 DIAGNOSIS — Z780 Asymptomatic menopausal state: Secondary | ICD-10-CM | POA: Diagnosis not present

## 2016-01-18 ENCOUNTER — Encounter: Payer: Self-pay | Admitting: Internal Medicine

## 2016-03-11 ENCOUNTER — Encounter: Payer: Self-pay | Admitting: Internal Medicine

## 2016-03-11 ENCOUNTER — Ambulatory Visit (INDEPENDENT_AMBULATORY_CARE_PROVIDER_SITE_OTHER): Payer: Commercial Managed Care - HMO | Admitting: Internal Medicine

## 2016-03-11 VITALS — BP 108/70 | HR 66 | Temp 97.8°F | Resp 12 | Ht 63.25 in | Wt 165.5 lb

## 2016-03-11 DIAGNOSIS — M545 Low back pain, unspecified: Secondary | ICD-10-CM

## 2016-03-11 DIAGNOSIS — Z7289 Other problems related to lifestyle: Secondary | ICD-10-CM | POA: Diagnosis not present

## 2016-03-11 DIAGNOSIS — Z0001 Encounter for general adult medical examination with abnormal findings: Secondary | ICD-10-CM

## 2016-03-11 DIAGNOSIS — E663 Overweight: Secondary | ICD-10-CM

## 2016-03-11 DIAGNOSIS — E785 Hyperlipidemia, unspecified: Secondary | ICD-10-CM

## 2016-03-11 DIAGNOSIS — R5383 Other fatigue: Secondary | ICD-10-CM

## 2016-03-11 DIAGNOSIS — Z0000 Encounter for general adult medical examination without abnormal findings: Secondary | ICD-10-CM | POA: Insufficient documentation

## 2016-03-11 DIAGNOSIS — M159 Polyosteoarthritis, unspecified: Secondary | ICD-10-CM

## 2016-03-11 DIAGNOSIS — M150 Primary generalized (osteo)arthritis: Secondary | ICD-10-CM

## 2016-03-11 DIAGNOSIS — G8929 Other chronic pain: Secondary | ICD-10-CM

## 2016-03-11 LAB — CBC WITH DIFFERENTIAL/PLATELET
BASOS ABS: 0 10*3/uL (ref 0.0–0.1)
Basophils Relative: 0.3 % (ref 0.0–3.0)
EOS ABS: 0.3 10*3/uL (ref 0.0–0.7)
Eosinophils Relative: 3.9 % (ref 0.0–5.0)
HEMATOCRIT: 41.3 % (ref 36.0–46.0)
Hemoglobin: 14.1 g/dL (ref 12.0–15.0)
LYMPHS PCT: 15.9 % (ref 12.0–46.0)
Lymphs Abs: 1.1 10*3/uL (ref 0.7–4.0)
MCHC: 34.1 g/dL (ref 30.0–36.0)
MCV: 100 fl (ref 78.0–100.0)
MONOS PCT: 8.9 % (ref 3.0–12.0)
Monocytes Absolute: 0.6 10*3/uL (ref 0.1–1.0)
NEUTROS ABS: 4.8 10*3/uL (ref 1.4–7.7)
Neutrophils Relative %: 71 % (ref 43.0–77.0)
PLATELETS: 296 10*3/uL (ref 150.0–400.0)
RBC: 4.13 Mil/uL (ref 3.87–5.11)
RDW: 12.9 % (ref 11.5–15.5)
WBC: 6.8 10*3/uL (ref 4.0–10.5)

## 2016-03-11 LAB — COMPREHENSIVE METABOLIC PANEL
ALBUMIN: 4.1 g/dL (ref 3.5–5.2)
ALT: 17 U/L (ref 0–35)
AST: 19 U/L (ref 0–37)
Alkaline Phosphatase: 62 U/L (ref 39–117)
BUN: 20 mg/dL (ref 6–23)
CHLORIDE: 105 meq/L (ref 96–112)
CO2: 29 mEq/L (ref 19–32)
CREATININE: 0.88 mg/dL (ref 0.40–1.20)
Calcium: 9.3 mg/dL (ref 8.4–10.5)
GFR: 66.68 mL/min (ref >60.00)
GLUCOSE: 95 mg/dL (ref 70–99)
Potassium: 4.7 mEq/L (ref 3.5–5.1)
SODIUM: 139 meq/L (ref 135–145)
Total Bilirubin: 0.6 mg/dL (ref 0.2–1.2)
Total Protein: 6.8 g/dL (ref 6.0–8.3)

## 2016-03-11 LAB — TSH: TSH: 1.12 u[IU]/mL (ref 0.35–4.50)

## 2016-03-11 LAB — LIPID PANEL
CHOL/HDL RATIO: 3
Cholesterol: 205 mg/dL — ABNORMAL HIGH (ref 0–200)
HDL: 74.7 mg/dL (ref >39.00)
LDL Cholesterol: 111 mg/dL — ABNORMAL HIGH (ref 0–99)
NONHDL: 130.03
TRIGLYCERIDES: 93 mg/dL (ref 0.0–149.0)
VLDL: 18.6 mg/dL (ref 0.0–40.0)

## 2016-03-11 NOTE — Patient Instructions (Signed)
  To make a low carb chip :  Take the Joseph's Lavash or Pita bread,  Or the [place] Low carb whole wheat tortilla   Place on metal cookie sheet  Brush with olive oil  Sprinkle garlic powder (NOT garlic salt), grated parmesan cheese, mediterranean seasoning , or all of them?  Bake at 275 for 30 minutes   We have substitutions for your potatoes!!  Try the mashed cauliflower and riced cauliflower dishes instead of rice and mashed potatoes  Mashed turnips are also very low carb!   For desserts :  Try the Dannon Lt n Fit greek yogurt dessert flavors and top with reddi Whip .  8 carbs,  80 calories  Try Oikos Triple Zero Greek Yogurt in the salted caramel, and the coffee flavors  With Whipped Cream for dessert  breyer's low carb ice cream, available in bars (on a stick, better ) or scoopable ice cream  HERE ARE THE LOW CARB  BREAD CHOICES        

## 2016-03-11 NOTE — Assessment & Plan Note (Signed)
Complicated by kyphoscoliosis.

## 2016-03-11 NOTE — Progress Notes (Addendum)
Patient ID: Tammy Tate, female    DOB: 1940-12-07  Age: 75 y.o. MRN: [id number]  The patient is here for annual physical examination and management of other chronic and acute problems.   The risk factors are reflected in the social history.  The roster of all physicians providing medical care to patient - is listed in the Snapshot section of the chart.  Activities of daily living:  The patient is 100% independent in all ADLs: dressing, toileting, feeding as well as independent mobility  Home safety : The patient has smoke detectors in the home. They wear seatbelts.  There are no firearms at home. There is no violence in the home.   There is no risks for hepatitis, STDs or HIV. There is no   history of blood transfusion. They have no travel history to infectious disease endemic areas of the world.  The patient has seen their dentist in the last six month. They have seen their eye doctor in the last year. They  Do not have hearing difficulty with regard to whispered voices and some television programs.    They have deferred audiologic testing in the last year.  They do not  have excessive sun exposure. Discussed the need for sun protection: hats, long sleeves and use of sunscreen if there is significant sun exposure.   Diet: the importance of a healthy diet is discussed. They do have a healthy diet.  The benefits of regular aerobic exercise were discussed. She walks 4 times per week ,  20 minutes.   Depression screen: there are no signs or vegative symptoms of depression- irritability, change in appetite, anhedonia, sadness/tearfullness.  Cognitive assessment: the patient manages all their financial and personal affairs and is actively engaged. They could relate day,date,year and events; recalled 2/3 objects at 3 minutes; performed clock-face test normally.  The following portions of the patient's history were reviewed and updated as appropriate: allergies, current medications, past  family history, past medical history,  past surgical history, past social history  and problem list.  Visual acuity was not assessed per patient preference since she has regular follow up with her ophthalmologist. Hearing and body mass index were assessed and reviewed.   During the course of the visit the patient was educated and counseled about appropriate screening and preventive services including : fall prevention , diabetes screening, nutrition counseling, colorectal cancer screening, and recommended immunizations.    CC: The primary encounter diagnosis was Chronic low back pain without sciatica, unspecified back pain laterality. Diagnoses of Other fatigue, Other problems related to lifestyle, Hyperlipidemia LDL goal <130, Visit for preventive health examination, Primary osteoarthritis involving multiple joints, and Overweight (BMI 25.0-29.9) were also pertinent to this visit.  Chronic mid thoracic back pain .  kyphosicoliosis and degenerative changes .  Her trainer at the [employer] Tammy Tate  Uses bands, weights, balls and bosu ball   Twice weekly concentrating and improving balance and toning. .   Lost weight down to 150 lbs , but retaining fluid now  Carpal tunnel and OA of both hands,  Waiting to see Tammy Tate since february  Waiting to see Tammy Tate for psoriasis .  Using triamcinolone cream prescribed by me once daily . Getting tired of waiting     usig Aleve once daily for back pain \  Using ambien once in a month   .  Height loss discussed  History Tammy Tate has a past medical history of Chicken pox; Thyroid disease; B12 deficiency; Allergy; GERD (gastroesophageal reflux disease);  Vasomotor rhinitis; Bronchitis; Degenerative disk disease; Degenerative arthritis of lumbar spine; Migraine with aura; and Cancer ([place]) (2010).   She has past surgical history that includes Appendectomy; Modified radical mastectomy w/ axillary lymph node dissection w/ pectoral minor preservation (2010); and  Abdominal hysterectomy.   Her family history includes Cancer (age of onset: 75) in her mother; Heart disease in her father; Hyperlipidemia in her father; Stroke (age of onset: 48) in her father.She reports that she quit smoking about 44 years ago. She has never used smokeless tobacco. She reports that she drinks about 4.2 oz of alcohol per week. She reports that she does not use illicit drugs.  Outpatient Prescriptions Prior to Visit  Medication Sig Dispense Refill  . Calcium Carbonate-Vitamin D (CALCIUM 600 + D PO) Take by mouth 2 (two) times daily.    . cetirizine (ZYRTEC) 10 MG tablet Take 10 mg by mouth daily.    . Cholecalciferol (VITAMIN D) 2000 UNITS tablet Take 2,000 Units by mouth daily.    . fluticasone (FLONASE) 50 MCG/ACT nasal spray Place 2 sprays into the nose daily.    Marland Kitchen levothyroxine (SYNTHROID, LEVOTHROID) 88 MCG tablet TAKE 1 TABLET EVERY DAY 30 tablet 1  . Multiple Vitamin (MULTIVITAMIN) tablet Take 1 tablet by mouth daily.    . Omega-3 Fatty Acids (OMEGA 3 PO) Take by mouth daily.    Marland Kitchen triamcinolone cream (KENALOG) 0.1 % Apply 1 application topically 2 (two) times daily. 30 g 0  . vitamin B-12 (CYANOCOBALAMIN) 1000 MCG tablet Take 1,000 mcg by mouth daily.    . vitamin E (VITAMIN E) 400 UNIT capsule Take 400 Units by mouth daily.    Marland Kitchen zolpidem (AMBIEN) 10 MG tablet Take 1 tablet (10 mg total) by mouth at bedtime as needed. 30 tablet 5  . olopatadine (PATANOL) 0.1 % ophthalmic solution 1 drop 2 (two) times daily.    . Lidocaine 4 % LOTN Apply twice daily as needed for shingles pain (Patient not taking: Reported on 03/11/2016) 88 mL 0   No facility-administered medications prior to visit.    Review of Systems   Patient denies headache, fevers, malaise, unintentional weight loss, skin rash, eye pain, sinus congestion and sinus pain, sore throat, dysphagia,  hemoptysis , cough, dyspnea, wheezing, chest pain, palpitations, orthopnea, edema, abdominal pain, nausea, melena,  diarrhea, constipation, flank pain, dysuria, hematuria, urinary  Frequency, nocturia, numbness, tingling, seizures,  Focal weakness, Loss of consciousness,  Tremor, insomnia, depression, anxiety, and suicidal ideation.      Objective:  BP 108/70 mmHg  Pulse 66  Temp(Src) 97.8 F (36.6 C) (Oral)  Resp 12  Ht 5' 3.25" (1.607 m)  Wt 165 lb 8 oz (75.07 kg)  BMI 29.07 kg/m2  SpO2 99%  Physical Exam   General appearance: alert, cooperative and appears stated age Head: Normocephalic, without obvious abnormality, atraumatic Eyes: conjunctivae/corneas clear. PERRL, EOM's intact. Fundi benign. Ears: normal TM's and external ear canals both ears Nose: Nares normal. Septum midline. Mucosa normal. No drainage or sinus tenderness. Throat: lips, mucosa, and tongue normal; teeth and gums normal Neck: no adenopathy, no carotid bruit, no JVD, supple, symmetrical, trachea midline and thyroid not enlarged, symmetric, no tenderness/mass/nodules Lungs: clear to auscultation bilaterally Breasts: bilateral mastectomy with reconstruction Heart: regular rate and rhythm, S1, S2 normal, no murmur, click, rub or gallop Abdomen: soft, non-tender; bowel sounds normal; no masses,  no organomegaly Extremities: extremities normal, atraumatic, no cyanosis or edema Pulses: 2+ and symmetric Skin: Skin color, texture, turgor normal. No  rashes or lesions Neurologic: Alert and oriented X 3, normal strength and tone. Normal symmetric reflexes. Normal coordination and gait.     Assessment & Plan:   Problem List Items Addressed This Visit    Lumbago - Primary    Complicated by kyphoscoliosis.        Overweight (BMI 25.0-29.9)    I have addressed  BMI and recommended wt loss of 10% of body weigh over the next 6 months using a low glycemic index diet and regular exercise a minimum of 5 days per week.        Arthritis, degenerative    Affecting lumbar spine , small joints of both hands,  Continue Aleve up to bid.  Prn ,        Visit for preventive health examination    Annual comprehensive preventive exam was done as well as an evaluation and management of chronic conditions .  During the course of the visit the patient was educated and counseled about appropriate screening and preventive services including :  diabetes screening, lipid analysis with projected  10 year  risk for CAD , nutrition counseling, breast, cervical and colorectal cancer screening, and recommended immunizations.  Printed recommendations for health maintenance screenings was given        Other Visit Diagnoses    Other fatigue        Other problems related to lifestyle        Hyperlipidemia LDL goal <130           I am having Ms. Wegman maintain her multivitamin, Calcium Carbonate-Vitamin D (CALCIUM 600 + D PO), Omega-3 Fatty Acids (OMEGA 3 PO), fluticasone, vitamin B-12, cetirizine, Vitamin D, zolpidem, vitamin E, Lidocaine, triamcinolone cream, and levothyroxine.  No orders of the defined types were placed in this encounter.    There are no discontinued medications.  Follow-up: No Follow-up on file.   Crecencio Mc, MD

## 2016-03-11 NOTE — Assessment & Plan Note (Signed)
Annual comprehensive preventive exam was done as well as an evaluation and management of chronic conditions .  During the course of the visit the patient was educated and counseled about appropriate screening and preventive services including :  diabetes screening, lipid analysis with projected  10 year  risk for CAD , nutrition counseling, breast, cervical and colorectal cancer screening, and recommended immunizations.  Printed recommendations for health maintenance screenings was given 

## 2016-03-11 NOTE — Assessment & Plan Note (Signed)
Affecting lumbar spine , small joints of both hands,  Continue Aleve up to bid. Prn ,

## 2016-03-11 NOTE — Assessment & Plan Note (Signed)
I have addressed  BMI and recommended wt loss of 10% of body weigh over the next 6 months using a low glycemic index diet and regular exercise a minimum of 5 days per week.   

## 2016-03-11 NOTE — Progress Notes (Signed)
Pre-visit discussion using our clinic review tool. No additional management support is needed unless otherwise documented below in the visit note.  

## 2016-03-12 LAB — HEPATITIS C ANTIBODY: HCV Ab: NEGATIVE

## 2016-03-12 LAB — HIV ANTIBODY (ROUTINE TESTING W REFLEX): HIV: NONREACTIVE

## 2016-03-13 ENCOUNTER — Encounter: Payer: Self-pay | Admitting: Internal Medicine

## 2016-03-14 ENCOUNTER — Encounter: Payer: Self-pay | Admitting: Internal Medicine

## 2016-03-15 ENCOUNTER — Other Ambulatory Visit: Payer: Self-pay

## 2016-03-15 MED ORDER — AZELASTINE HCL 0.05 % OP SOLN: 1.0000 [drp] | Freq: Two times a day (BID) | OPHTHALMIC | Status: DC

## 2016-03-15 MED ORDER — OLOPATADINE HCL 0.1 % OP SOLN: 1.0000 [drp] | Freq: Two times a day (BID) | OPHTHALMIC | Status: DC

## 2016-03-15 NOTE — Telephone Encounter (Signed)
Insurance wants to change the prescription of patanol .1% 5ml to Azelastine.05% 69ml twice aday. Please advise.

## 2016-03-15 NOTE — Telephone Encounter (Signed)
I refilled the eye drops but I do not see in note where you mention medication for arthritis.

## 2016-03-18 MED ORDER — CELECOXIB 200 MG PO CAPS: 200.0000 mg | ORAL_CAPSULE | Freq: Every day | ORAL | Status: DC

## 2016-03-18 NOTE — Addendum Note (Signed)
Addended by: Crecencio Mc on: 03/18/2016 01:08 PM   Modules accepted: Orders, SmartSet

## 2016-03-26 ENCOUNTER — Other Ambulatory Visit: Payer: Self-pay | Admitting: Internal Medicine

## 2016-04-04 ENCOUNTER — Encounter: Payer: Self-pay | Admitting: Family Medicine

## 2016-04-04 ENCOUNTER — Ambulatory Visit (INDEPENDENT_AMBULATORY_CARE_PROVIDER_SITE_OTHER): Payer: Commercial Managed Care - HMO | Admitting: Family Medicine

## 2016-04-04 VITALS — BP 132/80 | HR 74 | Wt 166.0 lb

## 2016-04-04 DIAGNOSIS — M4120 Other idiopathic scoliosis, site unspecified: Secondary | ICD-10-CM | POA: Diagnosis not present

## 2016-04-04 DIAGNOSIS — M5136 Other intervertebral disc degeneration, lumbar region: Secondary | ICD-10-CM | POA: Diagnosis not present

## 2016-04-04 DIAGNOSIS — M51369 Other intervertebral disc degeneration, lumbar region without mention of lumbar back pain or lower extremity pain: Secondary | ICD-10-CM | POA: Insufficient documentation

## 2016-04-04 MED ORDER — PREDNISONE 20 MG PO TABS: 40.0000 mg | ORAL_TABLET | Freq: Every day | ORAL | Status: DC

## 2016-04-04 NOTE — Patient Instructions (Signed)
Good to see you  Ice 20 minutes 2 times daily. Usually after activity and before bed. Exercises 3 times a week.  pennsaid pinkie amount topically 2 times daily as needed.  Keep wearing the good shoes.  Vitamin D 2000 IU daily Tumeric 500mg  twice daily.  Tart cherry extract any dose at night  It's important that you continue to stay active. Shoe inserts with good arch support may be helpful.  [employer] orthotics at [employer] could help.  Water aerobics and cycling with low resistance are the best two types of exercise for arthritis. If back pain worsens after the trip try prednisone daily for 5 days.  Come back and see me again after your great trip and we will see how we are doing.

## 2016-04-04 NOTE — Assessment & Plan Note (Signed)
Patient does have some degenerative changes of the lumbar spine likely. Some mild limitation. Patient's underlying scoliosis is also contribute in. Patient has done remarkable well though with the amount of musculature she has for her age. Encourage her to continue to do some of the weight lifting than she has been doing. Patient was recently given Celebrex in the discussed that we can try this. Patient will also try some over-the-counter medications a can be very beneficial for arthritis. 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 We discussed proper shoes. Patient given some prednisone just in case she has an exacerbation when she is on her trip. We discussed icing regimen. Patient will come back and see me again in 4 weeks for further evaluation. If worsening symptoms or no improvement I would like to get new x-rays as well as likely send to formal physical therapy.

## 2016-04-04 NOTE — Progress Notes (Signed)
[facility] [address] [place], [place] [address] Phone: [phone] Subjective:     CC: low back pain  [id number] Tammy Tate is a 75 y.o. female coming in with complaint of low back pain. Patient is a very active 75 year old who is been working with a Physiological scientist for quite some time. Certain have an exacerbating of her lower back. Seems that extending her backs severe pain. Denies any radiation down the legs. Patient denies any numbness. Patient states though so severe it did stop her from doing certain workouts. Patient has a trip to [place] planned at the end of the month and would like some relief from some of the discomfort. Patient denies any fevers chills or any abnormal weight loss. States that he can hurt her though when she lays down flat at night. She's lays on her side no pain at all. Does not remember any true injury.   patient in 2016 did have thoracic x-rays. These were independently visualized by me and showed degenerative scoliosis.no x-rays of patient's lower back.  Past Medical History  Diagnosis Date  . Chicken pox   . Thyroid disease   . B12 deficiency   . Allergy   . GERD (gastroesophageal reflux disease)   . Vasomotor rhinitis   . Bronchitis     hx of recurrent and bronchospasm  . Degenerative disk disease     KAM 08-04-2007  . Degenerative arthritis of lumbar spine   . Migraine with aura   . Cancer [facility]) 2010    DCIS , right   Past Surgical History  Procedure Laterality Date  . Appendectomy    . Modified radical mastectomy w/ axillary lymph node dissection w/ pectoralis minor preservation  2010    bilateral, for DCIS,  [name] , [facility]  . Abdominal hysterectomy      with prior HRT   Social History   Social History  . Marital Status: Married    Spouse Name: N/A  . Number of Children: N/A  . Years of Education: N/A   Social History Main Topics  . Smoking status: Former Smoker    Quit date: 10/23/1971  .  Smokeless tobacco: Never Used  . Alcohol Use: 4.2 oz/week    7 Glasses of wine per week     Comment: occasional  . Drug Use: No  . Sexual Activity: Yes   Other Topics Concern  . None   Social History Narrative   No Known Allergies Family History  Problem Relation Age of Onset  . Cancer Mother 17    esophgeal CA mets to lungs, spine   . Stroke Father 72    tobacco abuse, hyperlipidemia  . Hyperlipidemia Father   . Heart disease Father     Past medical history, social, surgical and family history all reviewed in electronic medical record.  No pertanent information unless stated regarding to the chief complaint.   Review of Systems: No headache, visual changes, nausea, vomiting, diarrhea, constipation, dizziness, abdominal pain, skin rash, fevers, chills, night sweats, weight loss, swollen lymph nodes, body aches, joint swelling, muscle aches, chest pain, shortness of breath, mood changes.   Objective Blood pressure 132/80, pulse 74, weight 166 lb (75.297 kg).  General: No apparent distress alert and oriented x3 mood and affect normal, dressed appropriately.  HEENT: Pupils equal, extraocular movements intact  Respiratory: Patient's speak in full sentences and does not appear short of breath  Cardiovascular: No lower extremity edema, non tender, no  erythema  Skin: Warm dry intact with no signs of infection or rash on extremities or on axial skeleton.  Abdomen: Soft nontender  Neuro: Cranial nerves II through XII are intact, neurovascularly intact in all extremities with 2+ DTRs and 2+ pulses.  Lymph: No lymphadenopathy of posterior or anterior cervical chain or axillae bilaterally.  Gait normal with good balance and coordination.  MSK:  Non tender with full range of motion and good stability and symmetric strength and tone of shoulders, elbows, wrist, hip, knee and ankles bilaterally. Arthritic changes of multiple joints Back Exam:  Inspection: kyphoscoliosis of the thoracolumbar  juncture Motion: Flexion 35 deg, Extension 15 deg with worsening pain, Side Bending to 35 deg bilaterally,  Rotation to 35 deg bilaterally  SLR laying: Negative  XSLR laying: Negative  Palpable tenderness: tender to palpation in the paraspinal musculature of the lumbar spine right greater than left. No spinous process tenderness. FABER: negative. Sensory change: Gross sensation intact to all lumbar and sacral dermatomes.  Reflexes: 2+ at both patellar tendons, 2+ at achilles tendons, Babinski's downgoing.  Strength at foot  Plantar-flexion: 5/5 Dorsi-flexion: 5/5 Eversion: 5/5 Inversion: 5/5  Leg strength  Quad: 5/5 Hamstring: 5/5 Hip flexor: 5/5 Hip abductors: 5/5  Gait unremarkable.  Procedure note [id number]; 15 minutes spent for Therapeutic exercises as stated in above notes.  This included exercises focusing on stretching, strengthening, with significant focus on eccentric aspects.  Low back exercises that included:  Pelvic tilt/bracing instruction to focus on control of the pelvic girdle and lower abdominal muscles  Glute strengthening exercises, focusing on proper firing of the glutes without engaging the low back muscles Proper stretching techniques for maximum relief for the hamstrings, hip flexors, low back and some rotation where tolerated   Proper technique shown and discussed handout in great detail with ATC.  All questions were discussed and answered.     Impression and Recommendations:     This case required medical decision making of moderate complexity.      Note: This dictation was prepared with Dragon dictation along with smaller phrase technology. Any transcriptional errors that result from this process are unintentional.

## 2016-05-06 NOTE — Progress Notes (Signed)
[name] [address] [place], [place] [address] Phone: [phone] Subjective:     CC: low back pain f/u  [id number]  Tammy Tate is a 75 y.o. female coming in with complaint of low back pain. Patient is a very active 75 year old who is been working with a Physiological scientist for quite some time. Patient was having increasing low back pain. Patient was to do home exercises, prednisone, gabapentin. States that when she was on the gabapentin and prednisone she seemed to be doing significantly better. When out of the country for some time. Has come back. Pain is coming back a little bit. C7 association with her bed. Patient still denies any radiation down the legs. Denies any weakness but states that the tightness in the back can keep her from doing some things.   patient in 2016 did have thoracic x-rays. These were independently visualized by me and showed degenerative scoliosis.no x-rays of patient's lower back.  Past Medical History:  Diagnosis Date  . Allergy   . B12 deficiency   . Bronchitis    hx of recurrent and bronchospasm  . Cancer [facility]) 2010   DCIS , right  . Chicken pox   . Degenerative arthritis of lumbar spine   . Degenerative disk disease    KAM 08-04-2007  . GERD (gastroesophageal reflux disease)   . Migraine with aura   . Thyroid disease   . Vasomotor rhinitis    Past Surgical History:  Procedure Laterality Date  . ABDOMINAL HYSTERECTOMY     with prior HRT  . APPENDECTOMY    . MODIFIED RADICAL MASTECTOMY W/ AXILLARY LYMPH NODE DISSECTION W/ PECTORALIS MINOR PRESERVATION  2010   bilateral, for DCIS,  [name] , [facility]   Social History   Social History  . Marital status: Married    Spouse name: N/A  . Number of children: N/A  . Years of education: N/A   Social History Main Topics  . Smoking status: Former Smoker    Quit date: 10/23/1971  . Smokeless tobacco: Never Used  . Alcohol use 4.2 oz/week    7 Glasses of wine  per week     Comment: occasional  . Drug use: No  . Sexual activity: Yes   Other Topics Concern  . Not on file   Social History Narrative  . No narrative on file   No Known Allergies Family History  Problem Relation Age of Onset  . Cancer Mother 41    esophgeal CA mets to lungs, spine   . Stroke Father 82    tobacco abuse, hyperlipidemia  . Hyperlipidemia Father   . Heart disease Father     Past medical history, social, surgical and family history all reviewed in electronic medical record.  No pertanent information unless stated regarding to the chief complaint.   Review of Systems: No headache, visual changes, nausea, vomiting, diarrhea, constipation, dizziness, abdominal pain, skin rash, fevers, chills, night sweats, weight loss, swollen lymph nodes, body aches, joint swelling, muscle aches, chest pain, shortness of breath, mood changes.   Objective  There were no vitals taken for this visit.  General: No apparent distress alert and oriented x3 mood and affect normal, dressed appropriately.  HEENT: Pupils equal, extraocular movements intact  Respiratory: Patient's speak in full sentences and does not appear short of breath  Cardiovascular: No lower extremity edema, non tender, no erythema  Skin: Warm dry intact with no signs of infection or rash on extremities  or on axial skeleton.  Abdomen: Soft nontender  Neuro: Cranial nerves II through XII are intact, neurovascularly intact in all extremities with 2+ DTRs and 2+ pulses.  Lymph: No lymphadenopathy of posterior or anterior cervical chain or axillae bilaterally.  Gait normal with good balance and coordination.  MSK:  Non tender with full range of motion and good stability and symmetric strength and tone of shoulders, elbows, wrist, hip, knee and ankles bilaterally. Arthritic changes of multiple joints Back Exam:  Inspection: kyphoscoliosis of the thoracolumbar juncture Motion: Flexion 35 deg, Extension 15 deg with worsening  pain still present. Side Bending to 35 deg bilaterally,  Rotation to 35 deg bilaterally  SLR laying: Negative  XSLR laying: Negative  Palpable tenderness: continued tenderness in the paraspinal musculature of the lumbar spine FABER: negative. Sensory change: Gross sensation intact to all lumbar and sacral dermatomes.  Reflexes: 2+ at both patellar tendons, 2+ at achilles tendons, Babinski's downgoing.  Strength at foot  Plantar-flexion: 5/5 Dorsi-flexion: 5/5 Eversion: 5/5 Inversion: 5/5  Leg strength  Quad: 5/5 Hamstring: 5/5 Hip flexor: 5/5 Hip abductors: 5/5  Gait unremarkable.     Impression and Recommendations:     This case required medical decision making of moderate complexity.      Note: This dictation was prepared with Dragon dictation along with smaller phrase technology. Any transcriptional errors that result from this process are unintentional.

## 2016-05-07 ENCOUNTER — Encounter: Payer: Self-pay | Admitting: Family Medicine

## 2016-05-07 ENCOUNTER — Ambulatory Visit (INDEPENDENT_AMBULATORY_CARE_PROVIDER_SITE_OTHER): Payer: Commercial Managed Care - HMO | Admitting: Family Medicine

## 2016-05-07 ENCOUNTER — Ambulatory Visit (INDEPENDENT_AMBULATORY_CARE_PROVIDER_SITE_OTHER)
Admission: RE | Admit: 2016-05-07 | Discharge: 2016-05-07 | Disposition: A | Discharge status: Home / Self Care | Payer: Commercial Managed Care - HMO | Source: Ambulatory Visit | Attending: Family Medicine | Admitting: Family Medicine

## 2016-05-07 VITALS — BP 134/80 | HR 76 | Wt 165.0 lb

## 2016-05-07 DIAGNOSIS — M5136 Other intervertebral disc degeneration, lumbar region: Secondary | ICD-10-CM | POA: Diagnosis not present

## 2016-05-07 MED ORDER — GABAPENTIN 100 MG PO CAPS: 100.0000 mg | ORAL_CAPSULE | Freq: Every day | ORAL | 3 refills | Status: DC

## 2016-05-07 NOTE — Assessment & Plan Note (Addendum)
Discussed with patient at great length. X-rays are pending down to further evaluate any bony deformities. Patient will be sent to formal physical therapy that I think will be beneficial and will avoid any heavy lifting at the moment. Patient's will restart the gabapentin at a low dose. I think that this will be beneficial. We discussed if any worsening symptoms such as radicular symptoms and advance imaging may be warranted. Patient will come back again in 4 weeks to see how she is responding. Spent  25 minutes with patient face-to-face and had greater than 50% of counseling including as described above in assessment and plan.

## 2016-05-07 NOTE — Patient Instructions (Signed)
God to see you  Ice is your friend Xray downstairs today  Physical therapy will be calling you and go 1 time and then do the exercises on your own or with [facility].  Gabapentin 100mg  at night may help with sleep and pain  Go bed shopping!!!!! See me again in 4-6 weeks to make sure you are doing better.

## 2016-05-28 ENCOUNTER — Other Ambulatory Visit: Payer: Self-pay | Admitting: Internal Medicine

## 2016-06-04 ENCOUNTER — Encounter: Payer: Self-pay | Admitting: Internal Medicine

## 2016-06-04 DIAGNOSIS — H269 Unspecified cataract: Secondary | ICD-10-CM

## 2016-06-04 NOTE — Telephone Encounter (Signed)
I couldn't find a diagnosis code for "annual eye exam" and patient said she needs referral for one.  I used cataracts because after 5 minutes I have to move on. To seeing patients!!!!,

## 2016-06-11 ENCOUNTER — Encounter: Payer: Self-pay | Admitting: Internal Medicine

## 2016-06-11 ENCOUNTER — Other Ambulatory Visit: Payer: Self-pay | Admitting: Internal Medicine

## 2016-06-11 DIAGNOSIS — D492 Neoplasm of unspecified behavior of bone, soft tissue, and skin: Secondary | ICD-10-CM

## 2016-06-12 NOTE — Progress Notes (Signed)
[name] [address] [place], [place] [address] Phone: [phone] Subjective:     CC: low back pain f/u  [id number]  Tammy Tate is a 75 y.o. female coming in with complaint of low back pain. Patient is a very active 75 year old who is been working with a Physiological scientist for quite some time. Patient was having increasing low back pain. Patient was to do home exercises, prednisone, gabapentin. States that when she was on the gabapentin and prednisone she seemed to be doing significantly better.  Now been in PT and was to continue on the gabapentin.  Patient states Doing well overall. Feels like she is doing relatively well having some exacerbation from time to time but no radiation down the legs or any numbness. States that she does he exercises fairly religiously. Denies any new symptoms.  patient in 2016 did have thoracic x-rays. These were independently visualized by me and showed degenerative scoliosis severe in degree.   Past Medical History:  Diagnosis Date  . Allergy   . B12 deficiency   . Bronchitis    hx of recurrent and bronchospasm  . Cancer [facility]) 2010   DCIS , right  . Chicken pox   . Degenerative arthritis of lumbar spine   . Degenerative disk disease    KAM 08-04-2007  . GERD (gastroesophageal reflux disease)   . Migraine with aura   . Thyroid disease   . Vasomotor rhinitis    Past Surgical History:  Procedure Laterality Date  . ABDOMINAL HYSTERECTOMY     with prior HRT  . APPENDECTOMY    . MODIFIED RADICAL MASTECTOMY W/ AXILLARY LYMPH NODE DISSECTION W/ PECTORALIS MINOR PRESERVATION  2010   bilateral, for DCIS,  [name] , [facility]   Social History   Social History  . Marital status: Married    Spouse name: N/A  . Number of children: N/A  . Years of education: N/A   Social History Main Topics  . Smoking status: Former Smoker    Quit date: 10/23/1971  . Smokeless tobacco: Never Used  . Alcohol use 4.2 oz/week    7  Glasses of wine per week     Comment: occasional  . Drug use: No  . Sexual activity: Yes   Other Topics Concern  . None   Social History Narrative  . None   No Known Allergies Family History  Problem Relation Age of Onset  . Cancer Mother 42    esophgeal CA mets to lungs, spine   . Stroke Father 15    tobacco abuse, hyperlipidemia  . Hyperlipidemia Father   . Heart disease Father     Past medical history, social, surgical and family history all reviewed in electronic medical record.  No pertanent information unless stated regarding to the chief complaint.   Review of Systems: No headache, visual changes, nausea, vomiting, diarrhea, constipation, dizziness, abdominal pain, skin rash, fevers, chills, night sweats, weight loss, swollen lymph nodes, body aches, joint swelling, muscle aches, chest pain, shortness of breath, mood changes.   Objective  Blood pressure (!) 118/7, pulse 65, weight 169 lb (76.7 kg), SpO2 95 %.  General: No apparent distress alert and oriented x3 mood and affect normal, dressed appropriately.  HEENT: Pupils equal, extraocular movements intact  Respiratory: Patient's speak in full sentences and does not appear short of breath  Cardiovascular: No lower extremity edema, non tender, no erythema  Skin: Warm dry intact with no signs of infection or  rash on extremities or on axial skeleton.  Abdomen: Soft nontender  Neuro: Cranial nerves II through XII are intact, neurovascularly intact in all extremities with 2+ DTRs and 2+ pulses.  Lymph: No lymphadenopathy of posterior or anterior cervical chain or axillae bilaterally.  Gait normal with good balance and coordination.  MSK:  Non tender with full range of motion and good stability and symmetric strength and tone of shoulders, elbows, wrist, hip, knee and ankles bilaterally. Arthritic changes of multiple joints Back Exam:  Inspection: kyphoscoliosis of the thoracolumbar juncture Motion: Flexion 35 deg, Extension  15 deg with No pain. Side Bending to 35 deg bilaterally,  Rotation to 35 deg bilaterally  SLR laying: Negative  XSLR laying: Negative  Palpable tenderness: No tenderness on exam today.  FABER: negative. Sensory change: Gross sensation intact to all lumbar and sacral dermatomes.  Reflexes: 2+ at both patellar tendons, 2+ at achilles tendons, Babinski's downgoing.  Strength at foot  Plantar-flexion: 5/5 Dorsi-flexion: 5/5 Eversion: 5/5 Inversion: 5/5  Leg strength  Quad: 5/5 Hamstring: 5/5 Hip flexor: 5/5 Hip abductors: 5/5  Gait unremarkable.     Impression and Recommendations:     This case required medical decision making of moderate complexity.      Note: This dictation was prepared with Dragon dictation along with smaller phrase technology. Any transcriptional errors that result from this process are unintentional.

## 2016-06-13 ENCOUNTER — Encounter: Payer: Self-pay | Admitting: Family Medicine

## 2016-06-13 ENCOUNTER — Ambulatory Visit (INDEPENDENT_AMBULATORY_CARE_PROVIDER_SITE_OTHER): Payer: Commercial Managed Care - HMO | Admitting: Family Medicine

## 2016-06-13 DIAGNOSIS — M5136 Other intervertebral disc degeneration, lumbar region: Secondary | ICD-10-CM

## 2016-06-13 NOTE — Assessment & Plan Note (Signed)
Patient does have severe arthritis of the lumbar spine. We discussed this likely is going to have exacerbations from time to time. Patient has medications for breakthrough pain. We discussed icing regimen and home exercises. We discussed continuing the same conservative therapy and his lungs patient does well she can follow-up as needed.

## 2016-06-13 NOTE — Patient Instructions (Signed)
Good to see you   

## 2016-06-14 ENCOUNTER — Ambulatory Visit: Payer: Commercial Managed Care - HMO

## 2016-06-17 ENCOUNTER — Ambulatory Visit (INDEPENDENT_AMBULATORY_CARE_PROVIDER_SITE_OTHER): Payer: Commercial Managed Care - HMO

## 2016-06-17 VITALS — BP 122/70 | HR 67 | Temp 98.0°F | Resp 14 | Ht 63.0 in | Wt 168.8 lb

## 2016-06-17 DIAGNOSIS — Z23 Encounter for immunization: Secondary | ICD-10-CM | POA: Diagnosis not present

## 2016-06-17 DIAGNOSIS — Z0000 Encounter for general adult medical examination without abnormal findings: Secondary | ICD-10-CM

## 2016-06-17 DIAGNOSIS — R739 Hyperglycemia, unspecified: Secondary | ICD-10-CM

## 2016-06-17 DIAGNOSIS — R7309 Other abnormal glucose: Secondary | ICD-10-CM

## 2016-06-17 LAB — MICROALBUMIN / CREATININE URINE RATIO
Creatinine,U: 140.7 mg/dL
MICROALB/CREAT RATIO: 0.9 mg/g (ref 0.0–30.0)
Microalb, Ur: 1.3 mg/dL (ref 0.0–1.9)

## 2016-06-17 LAB — HEMOGLOBIN A1C: Hgb A1c MFr Bld: 5.1 % (ref 4.6–6.5)

## 2016-06-17 NOTE — Patient Instructions (Addendum)
  Tammy Tate , Thank you for taking time to come for your Medicare Wellness Visit. I appreciate your ongoing commitment to your health goals. Please review the following plan we discussed and let me know if I can assist you in the future.   FOLLOW UP WITH DR. Derrel Nip AS NEEDED.  These are the goals we discussed: Goals    . Healthy Lifestyle          Maintain exercise routine of walking.  Add strength training when possible. Low carb foods.  Lean meats, vegetables. Stay hydrated and continue drinking plenty of fluids/water.       This is a list of the screening recommended for you and due dates:  Health Maintenance  Topic Date Due  . Hemoglobin A1C  21-Oct-1940  . Complete foot exam   09/29/1951  . Eye exam for diabetics  09/29/1951  . Urine Protein Check  09/29/1951  . Flu Shot  11/27/2016*  . Tetanus Vaccine  02/26/2019  . Colon Cancer Screening  06/07/2024  . DEXA scan (bone density measurement)  Completed  . Shingles Vaccine  Completed  . Pneumonia vaccines  Completed  *Topic was postponed. The date shown is not the original due date.

## 2016-06-17 NOTE — Progress Notes (Signed)
Subjective:   Tammy Tate is a 75 y.o. female who presents for Medicare Annual (Subsequent) preventive examination.  Review of Systems:  No ROS.  Medicare Wellness Visit.  Cardiac Risk Factors include: advanced age (>42men, >59 women)     Objective:     Vitals: BP 122/70 (BP Location: Left Arm, Patient Position: Sitting, Cuff Size: Normal)   Pulse 67   Temp 98 F (36.7 C) (Oral)   Resp 14   Ht 5\' 3"  (1.6 m)   Wt 168 lb 12.8 oz (76.6 kg)   SpO2 98%   BMI 29.90 kg/m   Body mass index is 29.9 kg/m.   Tobacco History  Smoking Status  . Former Smoker  . Quit date: 10/23/1971  Smokeless Tobacco  . Never Used     Counseling given: Not Answered   Past Medical History:  Diagnosis Date  . Allergy   . B12 deficiency   . Bronchitis    hx of recurrent and bronchospasm  . Cancer [facility]) 2010   DCIS , right  . Chicken pox   . Degenerative arthritis of lumbar spine   . Degenerative disk disease    KAM 08-04-2007  . GERD (gastroesophageal reflux disease)   . Migraine with aura   . Thyroid disease   . Vasomotor rhinitis    Past Surgical History:  Procedure Laterality Date  . ABDOMINAL HYSTERECTOMY     with prior HRT  . APPENDECTOMY    . MODIFIED RADICAL MASTECTOMY W/ AXILLARY LYMPH NODE DISSECTION W/ PECTORALIS MINOR PRESERVATION  2010   bilateral, for DCIS,  [name] , [facility]   Family History  Problem Relation Age of Onset  . Cancer Mother 47    esophgeal CA mets to lungs, spine   . Stroke Father 76    tobacco abuse, hyperlipidemia  . Hyperlipidemia Father   . Heart disease Father   . Diabetes Maternal Grandfather    History  Sexual Activity  . Sexual activity: Not Currently    Outpatient Encounter Prescriptions as of 06/17/2016  Medication Sig  . azelastine (OPTIVAR) 0.05 % ophthalmic solution Place 1 drop into both eyes 2 (two) times daily.  . Calcium Carbonate-Vitamin D (CALCIUM 600 + D PO) Take by mouth 2 (two) times daily.  . cetirizine (ZYRTEC) 10  MG tablet Take 10 mg by mouth daily.  . Cholecalciferol (VITAMIN D) 2000 UNITS tablet Take 2,000 Units by mouth daily.  Marland Kitchen gabapentin (NEURONTIN) 100 MG capsule Take 1 capsule (100 mg total) by mouth at bedtime.  Marland Kitchen levothyroxine (SYNTHROID, LEVOTHROID) 88 MCG tablet TAKE 1 TABLET BY MOUTH DAILY  . Multiple Vitamin (MULTIVITAMIN) tablet Take 1 tablet by mouth daily.  . Omega-3 Fatty Acids (OMEGA 3 PO) Take by mouth daily.  Marland Kitchen triamcinolone cream (KENALOG) 0.1 % Apply 1 application topically 2 (two) times daily.  . vitamin B-12 (CYANOCOBALAMIN) 1000 MCG tablet Take 1,000 mcg by mouth daily.  . vitamin E (VITAMIN E) 400 UNIT capsule Take 400 Units by mouth daily.  Marland Kitchen zolpidem (AMBIEN) 10 MG tablet Take 1 tablet (10 mg total) by mouth at bedtime as needed.  . [DISCONTINUED] Lidocaine 4 % LOTN Apply twice daily as needed for shingles pain  . [DISCONTINUED] olopatadine (PATANOL) 0.1 % ophthalmic solution Place 1 drop into both eyes 2 (two) times daily.  . [DISCONTINUED] predniSONE (DELTASONE) 20 MG tablet Take 2 tablets (40 mg total) by mouth daily with breakfast.  . fluticasone (FLONASE) 50 MCG/ACT nasal spray Place 2 sprays  into the nose daily.  . [DISCONTINUED] celecoxib (CELEBREX) 200 MG capsule Take 1 capsule (200 mg total) by mouth daily.   No facility-administered encounter medications on file as of 06/17/2016.     Activities of Daily Living In your present state of health, do you have any difficulty performing the following activities: 06/17/2016  Hearing? N  Vision? N  Difficulty concentrating or making decisions? N  Walking or climbing stairs? N  Dressing or bathing? N  Doing errands, shopping? N  Preparing Food and eating ? N  Using the Toilet? N  In the past six months, have you accidently leaked urine? N  Do you have problems with loss of bowel control? N  Managing your Medications? N  Managing your Finances? N  Housekeeping or managing your Housekeeping? N  Some recent data might  be hidden    Patient Care Team: Crecencio Mc, MD as PCP - General (Internal Medicine)    Assessment:    This is a routine wellness examination for Tammy Tate. The goal of the wellness visit is to assist the patient how to close the gaps in care and create a preventative care plan for the patient.   Taking calcium VIT D as appropriate/Osteoporosis reviewed.  Medications reviewed; taking without issues or barriers.  Safety issues reviewed; smoke and carbon monoxide detectors in the home. No firearms in the home. Wears seatbelts when driving or riding with others. No violence in the home.  No identified risk were noted; The patient was oriented x 3; appropriate in dress and manner and no objective failures at ADL's or IADL's.   Body mass index; discussed the importance of a healthy diet, water intake and exercise. Educational material provided.  High dose influenza vaccine administered, L deltoid.  Tolerated well.  A1C, urine microalbumin completed today.  Patient Concerns: None at this time. Follow up with PCP as needed.  Exercise Activities and Dietary recommendations Current Exercise Habits: Home exercise routine, Type of exercise: walking;strength training/weights, Time (Minutes): 30, Frequency (Times/Week): 5, Weekly Exercise (Minutes/Week): 150, Intensity: Mild  Goals    . Healthy Lifestyle          Maintain exercise routine of walking.  Add strength training when possible. Low carb foods.  Lean meats, vegetables. Stay hydrated and continue drinking plenty of fluids/water.      Fall Risk Fall Risk  06/17/2016 03/09/2015 03/05/2014 03/02/2014  Falls in the past year? No No No No   Depression Screen PHQ 2/9 Scores 06/17/2016 03/09/2015 03/05/2014 03/02/2014  PHQ - 2 Score 0 0 0 0     Cognitive Testing MMSE - Mini Mental State Exam 06/17/2016  Orientation to time 5  Orientation to Place 5  Registration 3  Attention/ Calculation 5  Recall 3  Language- name 2 objects 2    Language- repeat 1  Language- follow 3 step command 3  Language- read & follow direction 1  Write a sentence 1  Copy design 1  Total score 30    Immunization History  Administered Date(s) Administered  . Influenza Split 07/02/2013, 07/11/2014  . Influenza, High Dose Seasonal PF 06/17/2016  . Influenza-Unspecified 07/02/2013  . Pneumococcal Conjugate-13 03/02/2014  . Pneumococcal Polysaccharide-23 03/03/2007  . Tdap 02/25/2009  . Zoster 03/27/2011   Screening Tests Health Maintenance  Topic Date Due  . FOOT EXAM  09/29/1951  . OPHTHALMOLOGY EXAM  09/29/1951  . HEMOGLOBIN A1C  12/15/2016  . URINE MICROALBUMIN  06/17/2017  . TETANUS/TDAP  02/26/2019  . COLONOSCOPY  06/07/2024  . INFLUENZA VACCINE  Completed  . DEXA SCAN  Completed  . ZOSTAVAX  Completed  . PNA vac Low Risk Adult  Completed      Plan:    End of life planning; Advance aging; Advanced directives discussed. Copy of current HCPOA/Living Will requested.  Medicare Attestation I have personally reviewed: The patient's medical and social history Their use of alcohol, tobacco or illicit drugs Their current medications and supplements The patient's functional ability including ADLs,fall risks, home safety risks, cognitive, and hearing and visual impairment Diet and physical activities Evidence for depression   The patient's weight, height, BMI, and visual acuity have been recorded in the chart.  I have made referrals and provided education to the patient based on review of the above and I have provided the patient with a written personalized care plan for preventive services.    During the course of the visit the patient was educated and counseled about the following appropriate screening and preventive services:   Vaccines to include Pneumoccal, Influenza, Hepatitis B, Td, Zostavax, HCV  Electrocardiogram  Cardiovascular Disease  Colorectal cancer screening  Bone density screening  Diabetes  screening  Glaucoma screening  Mammography/PAP  Nutrition counseling   Patient Instructions (the written plan) was given to the patient.   OBrien-Blaney, Denisa L, LPN  [id number]    I have reviewed the above information and agree with above.   Deborra Medina, MD

## 2016-06-19 ENCOUNTER — Encounter: Payer: Self-pay | Admitting: Internal Medicine

## 2016-07-08 ENCOUNTER — Encounter: Payer: Self-pay | Admitting: Family

## 2016-07-08 ENCOUNTER — Ambulatory Visit (INDEPENDENT_AMBULATORY_CARE_PROVIDER_SITE_OTHER): Payer: Commercial Managed Care - HMO | Admitting: Family

## 2016-07-08 ENCOUNTER — Encounter: Payer: Self-pay | Admitting: Internal Medicine

## 2016-07-08 VITALS — BP 126/66 | HR 77 | Temp 98.7°F | Wt 169.8 lb

## 2016-07-08 DIAGNOSIS — J209 Acute bronchitis, unspecified: Secondary | ICD-10-CM | POA: Diagnosis not present

## 2016-07-08 MED ORDER — AZITHROMYCIN 250 MG PO TABS: ORAL_TABLET | ORAL | 0 refills | Status: DC

## 2016-07-08 NOTE — Progress Notes (Signed)
Pre visit review using our clinic review tool, if applicable. No additional management support is needed unless otherwise documented below in the visit note. 

## 2016-07-08 NOTE — Patient Instructions (Addendum)

## 2016-07-08 NOTE — Progress Notes (Signed)
Subjective:    Patient ID: Tammy Tate, female    DOB: 18-May-1941, 75 y.o.   MRN: [id number]  CC: Tammy Tate is a 75 y.o. female who presents today for an acute visit.    HPI: Patient is here for acute visit with chief complaint of cough and sore throat x 6 days, unchanged. Endorses nasal congestion, ear pain bilaterally. No fever, sinus pressure. Has tried Mucinex and Oceanographer without relief.history of seasonal allergies.  No h/o lung disease. Will be keeping her granddaughter who has leukemia this week.     HISTORY:  Past Medical History:  Diagnosis Date  . Allergy   . B12 deficiency   . Bronchitis    hx of recurrent and bronchospasm  . Cancer [facility]) 2010   DCIS , right  . Chicken pox   . Degenerative arthritis of lumbar spine   . Degenerative disk disease    KAM 08-04-2007  . GERD (gastroesophageal reflux disease)   . Migraine with aura   . Thyroid disease   . Vasomotor rhinitis    Past Surgical History:  Procedure Laterality Date  . ABDOMINAL HYSTERECTOMY     with prior HRT  . APPENDECTOMY    . MODIFIED RADICAL MASTECTOMY W/ AXILLARY LYMPH NODE DISSECTION W/ PECTORALIS MINOR PRESERVATION  2010   bilateral, for DCIS,  Tammy Tate , [facility]   Family History  Problem Relation Age of Onset  . Cancer Mother 25    esophgeal CA mets to lungs, spine   . Stroke Father 55    tobacco abuse, hyperlipidemia  . Hyperlipidemia Father   . Heart disease Father   . Diabetes Maternal Grandfather     Allergies: Review of patient's allergies indicates no known allergies. Current Outpatient Prescriptions on File Prior to Visit  Medication Sig Dispense Refill  . azelastine (OPTIVAR) 0.05 % ophthalmic solution Place 1 drop into both eyes 2 (two) times daily. 6 mL 12  . Calcium Carbonate-Vitamin D (CALCIUM 600 + D PO) Take by mouth 2 (two) times daily.    . cetirizine (ZYRTEC) 10 MG tablet Take 10 mg by mouth daily.    . Cholecalciferol (VITAMIN D) 2000 UNITS tablet Take  2,000 Units by mouth daily.    . fluticasone (FLONASE) 50 MCG/ACT nasal spray Place 2 sprays into the nose daily.    Marland Kitchen gabapentin (NEURONTIN) 100 MG capsule Take 1 capsule (100 mg total) by mouth at bedtime. 30 capsule 3  . levothyroxine (SYNTHROID, LEVOTHROID) 88 MCG tablet TAKE 1 TABLET BY MOUTH DAILY 30 tablet 2  . Multiple Vitamin (MULTIVITAMIN) tablet Take 1 tablet by mouth daily.    . Omega-3 Fatty Acids (OMEGA 3 PO) Take by mouth daily.    Marland Kitchen triamcinolone cream (KENALOG) 0.1 % Apply 1 application topically 2 (two) times daily. 30 g 0  . vitamin B-12 (CYANOCOBALAMIN) 1000 MCG tablet Take 1,000 mcg by mouth daily.    . vitamin E (VITAMIN E) 400 UNIT capsule Take 400 Units by mouth daily.    Marland Kitchen zolpidem (AMBIEN) 10 MG tablet Take 1 tablet (10 mg total) by mouth at bedtime as needed. 30 tablet 5   No current facility-administered medications on file prior to visit.     Social History  Substance Use Topics  . Smoking status: Former Smoker    Quit date: 10/23/1971  . Smokeless tobacco: Never Used  . Alcohol use 4.2 oz/week    7 Glasses of wine per week     Comment: occasional  Review of Systems  Constitutional: Negative for chills and fever.  HENT: Positive for ear pain and sore throat. Negative for ear discharge and sinus pressure.   Respiratory: Positive for cough. Negative for shortness of breath and wheezing.   Cardiovascular: Negative for chest pain and palpitations.  Gastrointestinal: Negative for nausea and vomiting.  Allergic/Immunologic: Positive for environmental allergies.  Neurological: Negative for headaches.      Objective:    BP 126/66   Pulse 77   Temp 98.7 F (37.1 C) (Oral)   Wt 169 lb 12.8 oz (77 kg)   SpO2 96%   BMI 30.08 kg/m    Physical Exam  Constitutional: She appears well-developed and well-nourished.  HENT:  Head: Normocephalic and atraumatic.  Right Ear: Hearing, tympanic membrane, external ear and ear canal normal. No drainage, swelling  or tenderness. No foreign bodies. Tympanic membrane is not erythematous and not bulging. No middle ear effusion. No decreased hearing is noted.  Left Ear: Hearing, tympanic membrane, external ear and ear canal normal. No drainage, swelling or tenderness. No foreign bodies. Tympanic membrane is not erythematous and not bulging.  No middle ear effusion. No decreased hearing is noted.  Nose: Nose normal. No rhinorrhea. Right sinus exhibits no maxillary sinus tenderness and no frontal sinus tenderness. Left sinus exhibits no maxillary sinus tenderness and no frontal sinus tenderness.  Mouth/Throat: Uvula is midline, oropharynx is clear and moist and mucous membranes are normal. No oropharyngeal exudate, posterior oropharyngeal edema, posterior oropharyngeal erythema or tonsillar abscesses.  Eyes: Conjunctivae are normal.  Cardiovascular: Regular rhythm, normal heart sounds and normal pulses.   Pulmonary/Chest: Effort normal and breath sounds normal. She has no wheezes. She has no rhonchi. She has no rales.  Lymphadenopathy:       Head (right side): No submental, no submandibular, no tonsillar, no preauricular, no posterior auricular and no occipital adenopathy present.       Head (left side): No submental, no submandibular, no tonsillar, no preauricular, no posterior auricular and no occipital adenopathy present.    She has no cervical adenopathy.  Neurological: She is alert.  Skin: Skin is warm and dry.  Psychiatric: She has a normal mood and affect. Her speech is normal and behavior is normal. Thought content normal.  Vitals reviewed.      Assessment & Plan:   1. Acute bronchitis, unspecified organism Afebrile. Patient and I agreed to treat based on duration of symptoms with antibiotic. I shared her concern as well with keeping her granddaughter with leukemia. Return  Precautions given.  - azithromycin (ZITHROMAX Z-PAK) 250 MG tablet; Take 2 tablets ( total of 500 mg) PO on day 1, then 1 tablet (  total of 250 mg) PO q24 x 4 days.  Dispense: 6 each; Refill: 0    I am having Tammy Tate start on azithromycin. I am also having her maintain her multivitamin, Calcium Carbonate-Vitamin D (CALCIUM 600 + D PO), Omega-3 Fatty Acids (OMEGA 3 PO), fluticasone, vitamin B-12, cetirizine, Vitamin D, zolpidem, vitamin E, triamcinolone cream, azelastine, gabapentin, and levothyroxine.   Meds ordered this encounter  Medications  . azithromycin (ZITHROMAX Z-PAK) 250 MG tablet    Sig: Take 2 tablets ( total of 500 mg) PO on day 1, then 1 tablet ( total of 250 mg) PO q24 x 4 days.    Dispense:  6 each    Refill:  0    Order Specific Question:   Supervising Provider    Answer:   Crecencio Mc [  [id number]]    Return precautions given.   Risks, benefits, and alternatives of the medications and treatment plan prescribed today were discussed, and patient expressed understanding.   Education regarding symptom management and diagnosis given to patient on AVS.  Continue to follow with TULLO, Aris Everts, MD for routine health maintenance.   Arlana Pouch and I agreed with plan.   Mable Paris, FNP

## 2016-08-19 ENCOUNTER — Encounter: Payer: Self-pay | Admitting: Internal Medicine

## 2016-08-19 DIAGNOSIS — M545 Low back pain, unspecified: Secondary | ICD-10-CM

## 2016-08-30 ENCOUNTER — Telehealth: Payer: Self-pay | Admitting: Internal Medicine

## 2016-08-30 NOTE — Telephone Encounter (Signed)
Last appointment 10/17 does patient need appointment first.

## 2016-08-30 NOTE — Telephone Encounter (Signed)
Pt called about needing a referral to Dr Anda Latina [facility]. Due to the coughing and having trouble swallowing. Please advise?  Call pt @ [phone]. Thank you!

## 2016-09-01 ENCOUNTER — Encounter: Payer: Self-pay | Admitting: Internal Medicine

## 2016-09-01 DIAGNOSIS — R059 Cough, unspecified: Secondary | ICD-10-CM

## 2016-09-01 DIAGNOSIS — R05 Cough: Secondary | ICD-10-CM

## 2016-09-02 NOTE — Telephone Encounter (Signed)
Left message for patient to call office appointment with PCP for referral to ENT.

## 2016-09-02 NOTE — Telephone Encounter (Signed)
Trouble swallowing is not always an ENT issue.  She should see me first

## 2016-09-03 ENCOUNTER — Ambulatory Visit
Admission: RE | Admit: 2016-09-03 | Discharge: 2016-09-03 | Disposition: A | Discharge status: Home / Self Care | Payer: Commercial Managed Care - HMO | Source: Ambulatory Visit | Attending: Internal Medicine | Admitting: Internal Medicine

## 2016-09-03 DIAGNOSIS — M545 Low back pain, unspecified: Secondary | ICD-10-CM

## 2016-09-03 DIAGNOSIS — M419 Scoliosis, unspecified: Secondary | ICD-10-CM | POA: Insufficient documentation

## 2016-09-03 NOTE — Telephone Encounter (Signed)
Patient has appointment with ENT,

## 2016-09-04 ENCOUNTER — Other Ambulatory Visit: Payer: Self-pay | Admitting: Internal Medicine

## 2016-09-05 ENCOUNTER — Encounter: Payer: Self-pay | Admitting: Internal Medicine

## 2016-09-05 DIAGNOSIS — M545 Low back pain, unspecified: Secondary | ICD-10-CM

## 2016-09-05 DIAGNOSIS — G8929 Other chronic pain: Secondary | ICD-10-CM

## 2016-09-05 IMAGING — US US ABDOMEN LIMITED
1 series · 14 of 25 positions shown · non-contrast
Comparison: None.

CLINICAL DATA: Continuous right upper quadrant abdominal pain for 1
month.

EXAM:
US ABDOMEN LIMITED - RIGHT UPPER QUADRANT

[Series 1: us abdomen limited · 0.18mm/px · 14 of 82 slices shown]
[im 1/82]
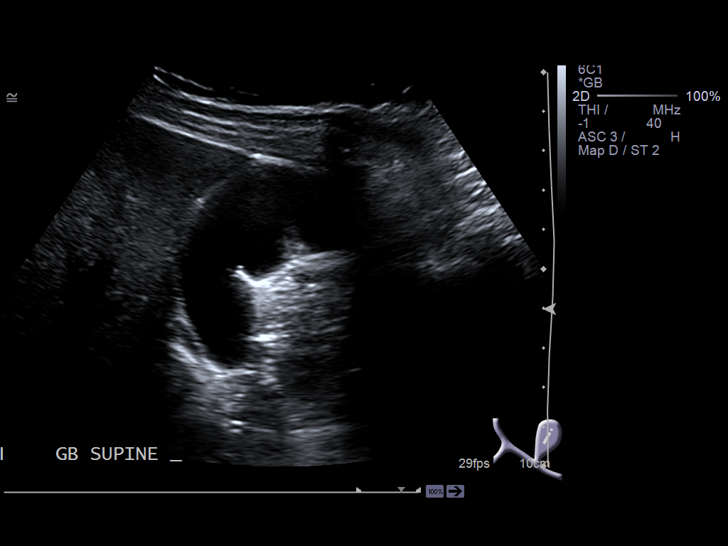
[im 7/82]
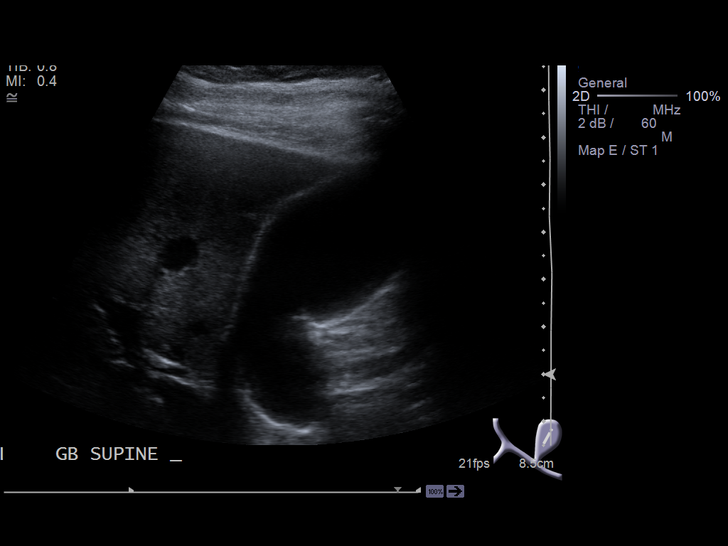
[im 14/82]
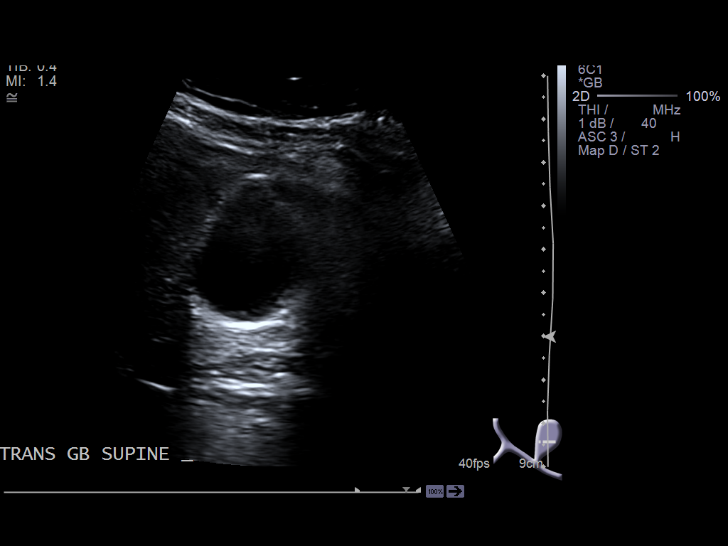
[im 21/82]
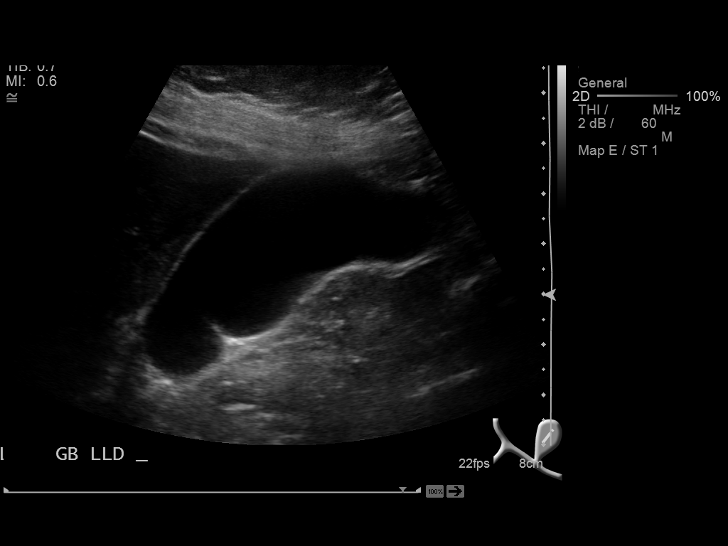
[im 28/82]
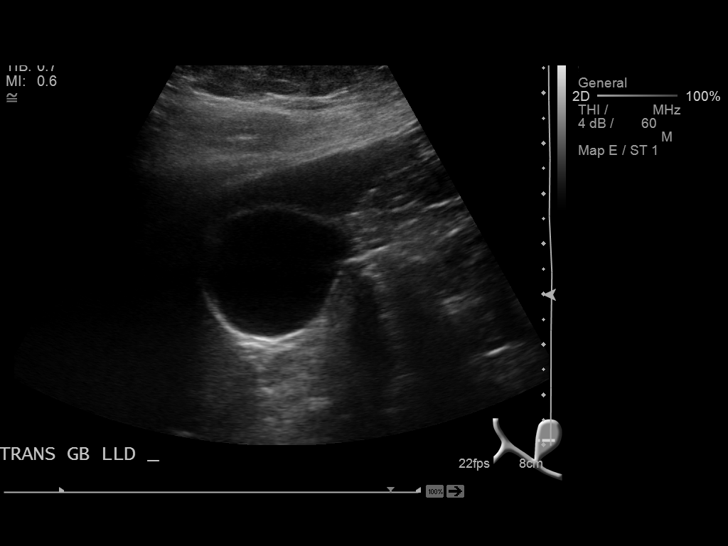
[im 31/82]
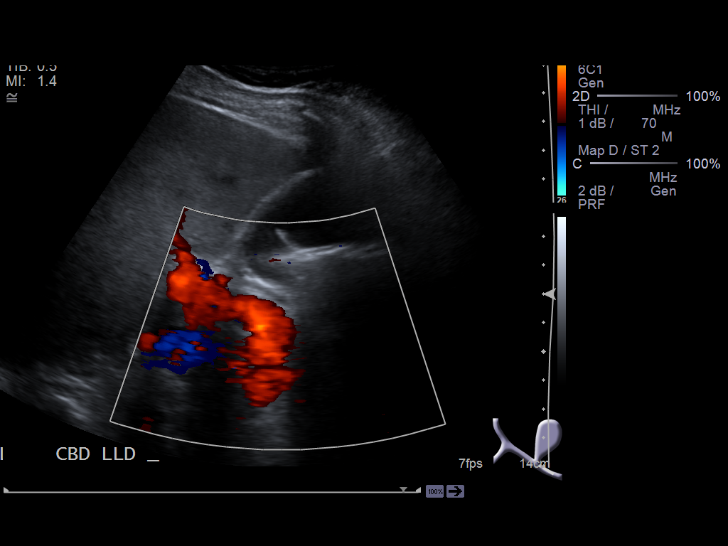
[im 38/82]
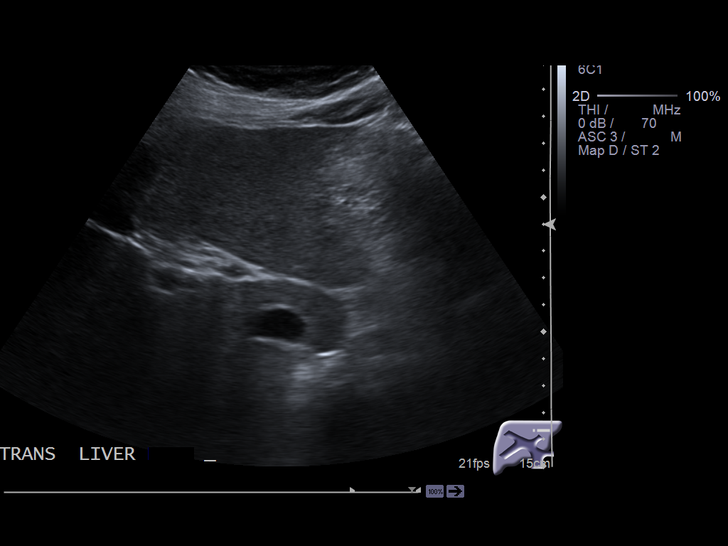
[im 44/82]
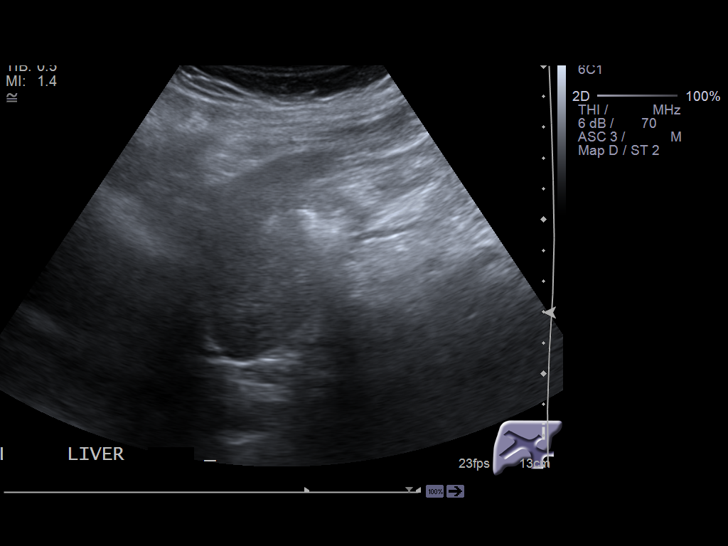
[im 51/82]
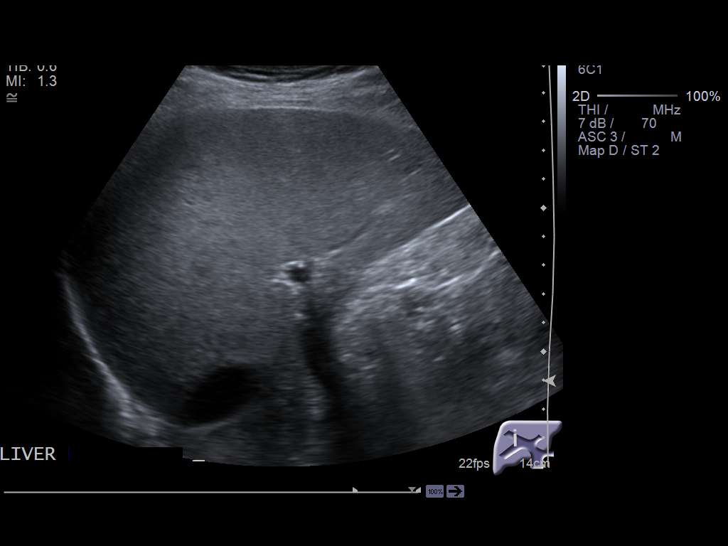
[im 55/82]
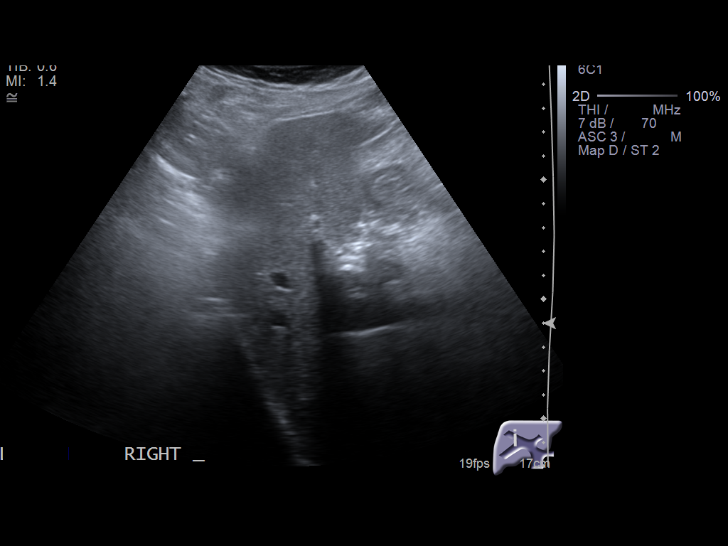
[im 61/82]
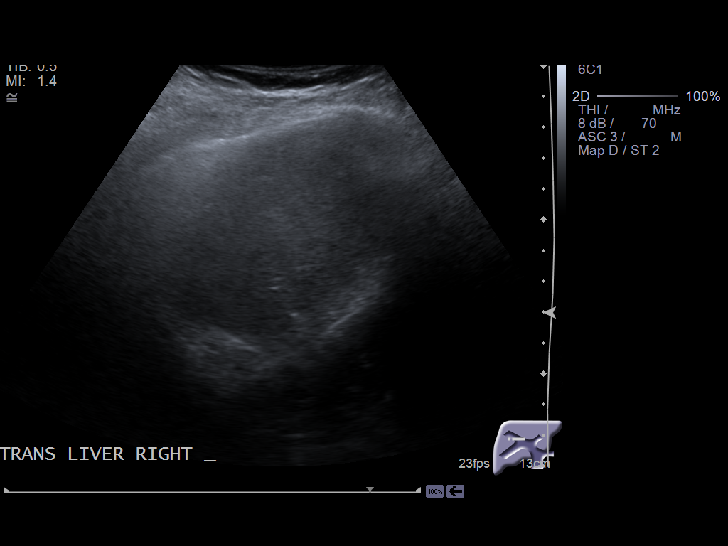
[im 68/82]
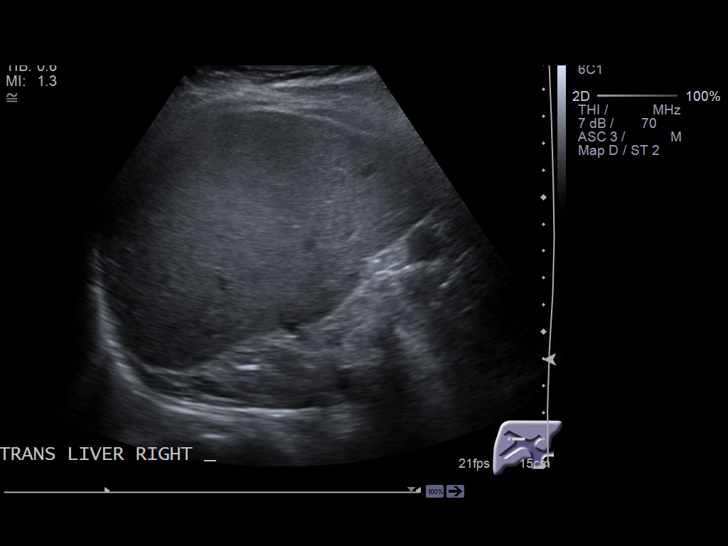
[im 75/82]
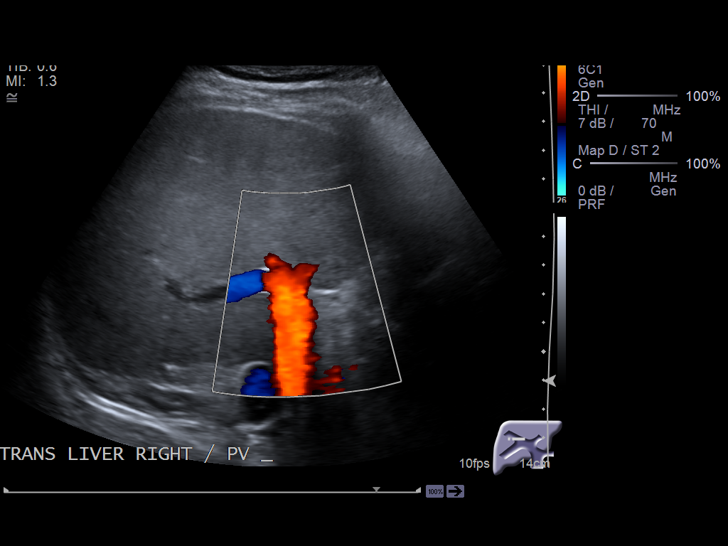
[im 82/82]
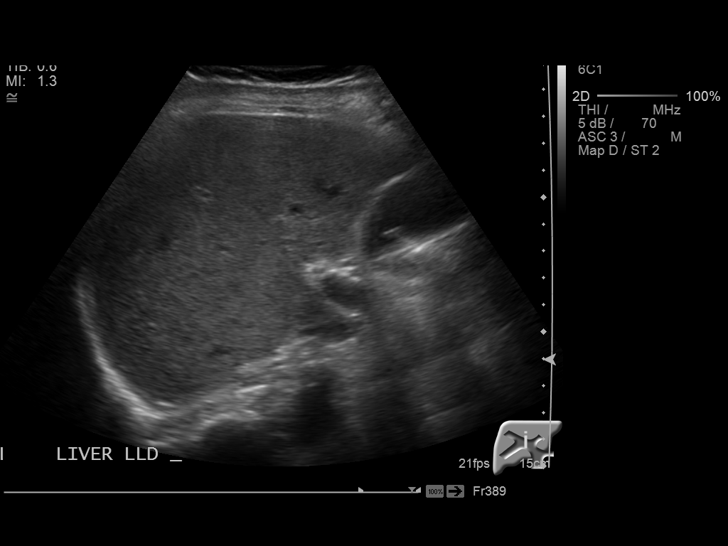

[14 of 25 positions shown; findings below may reference images not displayed]

FINDINGS: Gallbladder:

Sonographically normal. No echogenic gallstones or gall sludge. No
gallbladder wall thickening or pericholecystic fluid. Negative
sonographic Murphy's sign.

Common bile duct:

Diameter: Normal in size measuring 2.5 mm in diameter

Liver:

Homogeneous hepatic echotexture. No discrete hepatic lesions. No
definite evidence of intrahepatic biliary ductal dilatation. No
ascites.

Additional provided grayscale images of the patient's palpable area
of concern along the right flank are negative for discrete solid or
cystic mass.
IMPRESSION: No explanation for patient's right upper quadrant abdominal pain.
Specifically, no evidence of cholelithiasis or cholecystitis.

## 2016-09-16 ENCOUNTER — Ambulatory Visit
Admission: RE | Admit: 2016-09-16 | Discharge: 2016-09-16 | Disposition: A | Discharge status: Home / Self Care | Payer: Commercial Managed Care - HMO | Source: Ambulatory Visit | Attending: Unknown Physician Specialty | Admitting: Unknown Physician Specialty

## 2016-09-16 ENCOUNTER — Other Ambulatory Visit: Payer: Self-pay | Admitting: Unknown Physician Specialty

## 2016-09-16 ENCOUNTER — Encounter: Payer: Self-pay | Admitting: Internal Medicine

## 2016-09-16 DIAGNOSIS — R059 Cough, unspecified: Secondary | ICD-10-CM

## 2016-09-16 DIAGNOSIS — R05 Cough: Secondary | ICD-10-CM

## 2016-09-16 DIAGNOSIS — Z135 Encounter for screening for eye and ear disorders: Secondary | ICD-10-CM

## 2016-09-16 NOTE — Telephone Encounter (Signed)
Patient needs opthalmology referral for diabetic eye exam.

## 2016-09-17 ENCOUNTER — Encounter: Payer: Self-pay | Admitting: Internal Medicine

## 2016-09-27 ENCOUNTER — Other Ambulatory Visit: Payer: Self-pay | Admitting: Family Medicine

## 2016-09-27 ENCOUNTER — Other Ambulatory Visit: Payer: Self-pay | Admitting: Internal Medicine

## 2016-09-27 NOTE — Telephone Encounter (Signed)
Refill done.  

## 2016-10-28 DIAGNOSIS — R05 Cough: Secondary | ICD-10-CM | POA: Diagnosis not present

## 2016-11-25 ENCOUNTER — Encounter: Payer: Self-pay | Admitting: Internal Medicine

## 2016-12-02 ENCOUNTER — Other Ambulatory Visit: Payer: Self-pay | Admitting: Internal Medicine

## 2016-12-09 ENCOUNTER — Encounter: Payer: Self-pay | Admitting: Internal Medicine

## 2017-01-21 ENCOUNTER — Telehealth: Payer: Self-pay | Admitting: *Deleted

## 2017-01-21 ENCOUNTER — Encounter: Payer: Self-pay | Admitting: Internal Medicine

## 2017-01-21 MED ORDER — OSELTAMIVIR PHOSPHATE 75 MG PO CAPS: 75.0000 mg | ORAL_CAPSULE | Freq: Two times a day (BID) | ORAL | 0 refills | Status: DC

## 2017-01-21 MED ORDER — LEVOFLOXACIN 500 MG PO TABS: 500.0000 mg | ORAL_TABLET | Freq: Every day | ORAL | 0 refills | Status: DC

## 2017-01-21 NOTE — Telephone Encounter (Signed)
One of her friends had it 1.5 weeks ago and another friend had it the end of last week into the beginning of this week. She saw friend two days ago. Pt was only taking medication if she felt it coming on. She wanted it to make sure she didn't get sick while in [place].

## 2017-01-21 NOTE — Telephone Encounter (Signed)
Patient is leaving for [place], she has requested to have a Rx for a antibiotic, she has been exposed to the flu, and she requested to have a medication on hand if she becomes sick while out of the country.  Pt contact [phone] Pharmacy [facility]

## 2017-01-21 NOTE — Telephone Encounter (Signed)
Please advise 

## 2017-01-21 NOTE — Telephone Encounter (Signed)
tamiflu sent to [facility].  She does not need to take it unless she develops flu symptoms

## 2017-01-21 NOTE — Telephone Encounter (Signed)
Find out  When the flu exposure happened ,  I cannot prescribe tamiflu without more detail.  I will call in the levaquin to take with her

## 2017-01-22 NOTE — Telephone Encounter (Signed)
Pt aware and stated she only wanted in case she started to have symptoms.

## 2017-03-13 ENCOUNTER — Ambulatory Visit (INDEPENDENT_AMBULATORY_CARE_PROVIDER_SITE_OTHER): Payer: Medicare HMO | Admitting: Internal Medicine

## 2017-03-13 ENCOUNTER — Encounter: Payer: Self-pay | Admitting: Internal Medicine

## 2017-03-13 VITALS — BP 108/72 | HR 65 | Temp 98.1°F | Resp 15 | Ht 63.0 in | Wt 167.8 lb

## 2017-03-13 DIAGNOSIS — Z79899 Other long term (current) drug therapy: Secondary | ICD-10-CM

## 2017-03-13 DIAGNOSIS — Z0000 Encounter for general adult medical examination without abnormal findings: Secondary | ICD-10-CM

## 2017-03-13 DIAGNOSIS — M130 Polyarthritis, unspecified: Secondary | ICD-10-CM | POA: Diagnosis not present

## 2017-03-13 DIAGNOSIS — K2100 Gastro-esophageal reflux disease with esophagitis, without bleeding: Secondary | ICD-10-CM

## 2017-03-13 DIAGNOSIS — E7800 Pure hypercholesterolemia, unspecified: Secondary | ICD-10-CM

## 2017-03-13 DIAGNOSIS — E559 Vitamin D deficiency, unspecified: Secondary | ICD-10-CM

## 2017-03-13 DIAGNOSIS — E039 Hypothyroidism, unspecified: Secondary | ICD-10-CM | POA: Diagnosis not present

## 2017-03-13 DIAGNOSIS — E538 Deficiency of other specified B group vitamins: Secondary | ICD-10-CM | POA: Diagnosis not present

## 2017-03-13 LAB — VITAMIN D 25 HYDROXY (VIT D DEFICIENCY, FRACTURES): VITD: 40.85 ng/mL (ref 30.00–100.00)

## 2017-03-13 LAB — COMPREHENSIVE METABOLIC PANEL
ALK PHOS: 73 U/L (ref 39–117)
ALT: 16 U/L (ref 0–35)
AST: 18 U/L (ref 0–37)
Albumin: 4.2 g/dL (ref 3.5–5.2)
BILIRUBIN TOTAL: 0.5 mg/dL (ref 0.2–1.2)
BUN: 19 mg/dL (ref 6–23)
CO2: 30 mEq/L (ref 19–32)
CREATININE: 0.92 mg/dL (ref 0.40–1.20)
Calcium: 9.3 mg/dL (ref 8.4–10.5)
Chloride: 104 mEq/L (ref 96–112)
GFR: 63.17 mL/min (ref >60.00)
Glucose, Bld: 91 mg/dL (ref 70–99)
Potassium: 4.5 mEq/L (ref 3.5–5.1)
Sodium: 140 mEq/L (ref 135–145)
TOTAL PROTEIN: 6.5 g/dL (ref 6.0–8.3)

## 2017-03-13 LAB — LIPID PANEL
CHOL/HDL RATIO: 3
Cholesterol: 204 mg/dL — ABNORMAL HIGH (ref 0–200)
HDL: 80.7 mg/dL (ref >39.00)
LDL Cholesterol: 110 mg/dL — ABNORMAL HIGH (ref 0–99)
NonHDL: 122.9
Triglycerides: 67 mg/dL (ref 0.0–149.0)
VLDL: 13.4 mg/dL (ref 0.0–40.0)

## 2017-03-13 LAB — VITAMIN B12: Vitamin B-12: 336 pg/mL (ref 211–911)

## 2017-03-13 LAB — TSH: TSH: 1.53 u[IU]/mL (ref 0.35–4.50)

## 2017-03-13 MED ORDER — FAMOTIDINE 20 MG PO TABS: 20.0000 mg | ORAL_TABLET | Freq: Two times a day (BID) | ORAL | 1 refills | Status: DC

## 2017-03-13 NOTE — Assessment & Plan Note (Signed)
Managing with topical marijuana extract and acupuncture.

## 2017-03-13 NOTE — Assessment & Plan Note (Addendum)
Discussed current controversy regarding prolonged use of PPI in patients without documented Barretts esophagus.  Patient has no prior EGD but has been on PPI therapy for > 5 years (per patient).  Suggested trial of pepcid 20 mg daily.  If GERD symptoms return,  resume PPI

## 2017-03-13 NOTE — Assessment & Plan Note (Signed)
Checkinglevel and intrinsic  factor ab to determine if oral supplements will be sufficient.

## 2017-03-13 NOTE — Progress Notes (Signed)
Patient ID: Tammy Tate, female    DOB: [birth year]  Age: 76 y.o. MRN: [id number]  The patient is here for annual physical examination and management of other chronic and acute problems.   S/p bilateral total mastectomy In 2010 Colonoscopy 2015 normal,  10 yr follow up DEXA scan 2017  The risk factors are reflected in the social history.  The roster of all physicians providing medical care to patient - is listed in the Snapshot section of the chart.  Activities of daily living:  The patient is 100% independent in all ADLs: dressing, toileting, feeding as well as independent mobility  Home safety : The patient has smoke detectors in the home. They wear seatbelts.  There are no firearms at home. There is no violence in the home.   There is no risks for hepatitis, STDs or HIV. There is no   history of blood transfusion. They have no travel history to infectious disease endemic areas of the world.  The patient has seen their dentist in the last six month. They have seen their eye Tammy in the last year. They admit to slight hearing difficulty with regard to whispered voices and some television programs.  They have deferred audiologic testing in the last year.  They do not  have excessive sun exposure. Discussed the need for sun protection: hats, long sleeves and use of sunscreen if there is significant sun exposure.   Diet: the importance of a healthy diet is discussed. They do have a healthy diet.  The benefits of regular aerobic exercise were discussed. She walks 4 times per week ,  20 minutes.   Depression screen: there are no signs or vegative symptoms of depression- irritability, change in appetite, anhedonia, sadness/tearfullness.  Cognitive assessment: the patient manages all their financial and personal affairs and is actively engaged. They could relate day,date,year and events; recalled 2/3 objects at 3 minutes; performed clock-face test normally.  The following portions of the  patient's history were reviewed and updated as appropriate: allergies, current medications, past family history, past medical history,  past surgical history, past social history  and problem list.  Visual acuity was not assessed per patient preference since she has regular follow up with her ophthalmologist. Hearing and body mass index were assessed and reviewed.   During the course of the visit the patient was educated and counseled about appropriate screening and preventive services including : fall prevention , diabetes screening, nutrition counseling, colorectal cancer screening, and recommended immunizations.    CC: The primary encounter diagnosis was B12 deficiency. Diagnoses of Acquired hypothyroidism, Vitamin D deficiency, Long-term use of high-risk medication, Pure hypercholesterolemia, Visit for preventive health examination, Polyarthritis of hand, and Esophagitis, reflux were also pertinent to this visit.  1) joint pain: Sees Tammy Tate in [place] ("Tammy Tate") for acupuncture . She is using  Using a topical agent with THC in it for managmenet of CMC joint pain,  Effective , short acting   2) dyspnareunia:  Has not had intercourse in years, wants to know what can be used to help with the pain of penetration. History of BRCA,       History Tammy Tate has a past medical history of Allergy; B12 deficiency; Bronchitis; Cancer ([place]) (2010); Chicken pox; Degenerative arthritis of lumbar spine; Degenerative disk disease; GERD (gastroesophageal reflux disease); Migraine with aura; Thyroid disease; and Vasomotor rhinitis.   She has a past surgical history that includes Appendectomy; Modified radical mastectomy w/ axillary lymph node dissection w/  pectoral minor preservation (2010); and Abdominal hysterectomy.   Her family history includes Cancer (age of onset: 35) in her mother; Diabetes in her maternal grandfather; Heart disease in her father; Hyperlipidemia in  her father; Stroke (age of onset: 72) in her father.She reports that she quit smoking about 45 years ago. She has never used smokeless tobacco. She reports that she drinks about 4.2 oz of alcohol per week . She reports that she does not use drugs.  Outpatient Medications Prior to Visit  Medication Sig Dispense Refill  . azelastine (OPTIVAR) 0.05 % ophthalmic solution Place 1 drop into both eyes 2 (two) times daily. 6 mL 12  . Calcium Carbonate-Vitamin D (CALCIUM 600 + D PO) Take by mouth 2 (two) times daily.    . Cholecalciferol (VITAMIN D) 2000 UNITS tablet Take 2,000 Units by mouth daily.    . fluticasone (FLONASE) 50 MCG/ACT nasal spray Place 2 sprays into the nose daily.    Marland Kitchen gabapentin (NEURONTIN) 100 MG capsule TAKE ONE CAPSULE AT BEDTIME 90 capsule 0  . levothyroxine (SYNTHROID, LEVOTHROID) 88 MCG tablet TAKE 1 TABLET BY MOUTH DAILY 30 tablet 5  . Multiple Vitamin (MULTIVITAMIN) tablet Take 1 tablet by mouth daily.    . Omega-3 Fatty Acids (OMEGA 3 PO) Take by mouth daily.    . vitamin B-12 (CYANOCOBALAMIN) 1000 MCG tablet Take 1,000 mcg by mouth daily.    . vitamin E (VITAMIN E) 400 UNIT capsule Take 400 Units by mouth daily.    Marland Kitchen zolpidem (AMBIEN) 10 MG tablet TAKE ONE TABLET AT BEDTIME AS NEEDED 30 tablet 0  . azithromycin (ZITHROMAX Z-PAK) 250 MG tablet Take 2 tablets ( total of 500 mg) PO on day 1, then 1 tablet ( total of 250 mg) PO q24 x 4 days. (Patient not taking: Reported on 03/13/2017) 6 each 0  . cetirizine (ZYRTEC) 10 MG tablet Take 10 mg by mouth daily.    Marland Kitchen levofloxacin (LEVAQUIN) 500 MG tablet Take 1 tablet (500 mg total) by mouth daily. (Patient not taking: Reported on 03/13/2017) 7 tablet 0  . oseltamivir (TAMIFLU) 75 MG capsule Take 1 capsule (75 mg total) by mouth 2 (two) times daily. (Patient not taking: Reported on 03/13/2017) 10 capsule 0  . triamcinolone cream (KENALOG) 0.1 % Apply 1 application topically 2 (two) times daily. (Patient not taking: Reported on 03/13/2017)  30 g 0   No facility-administered medications prior to visit.     Review of Systems   Patient denies headache, fevers, malaise, unintentional weight loss, skin rash, eye pain, sinus congestion and sinus pain, sore throat, dysphagia,  hemoptysis , cough, dyspnea, wheezing, chest pain, palpitations, orthopnea, edema, abdominal pain, nausea, melena, diarrhea, constipation, flank pain, dysuria, hematuria, urinary  Frequency, nocturia, numbness, tingling, seizures,  Focal weakness, Loss of consciousness,  Tremor, insomnia, depression, anxiety, and suicidal ideation.      Objective:  BP 108/72 (BP Location: Left Arm, Patient Position: Sitting, Cuff Size: Normal)   Pulse 65   Temp 98.1 F (36.7 C) (Oral)   Resp 15   Ht _0  (1.6 m)   Wt 167 lb 12.8 oz (76.1 kg)   SpO2 97%   BMI 29.72 kg/m   Physical Exam   General appearance: alert, cooperative and appears stated age Ears: normal TM's and external ear canals both ears Throat: lips, mucosa, and tongue normal; teeth and gums normal Neck: no adenopathy, no carotid bruit, supple, symmetrical, trachea midline and thyroid not enlarged, symmetric, no tenderness/mass/nodules Back: symmetric,  no curvature. ROM normal. No CVA tenderness. Lungs: clear to auscultation bilaterally Heart: regular rate and rhythm, S1, S2 normal, no murmur, click, rub or gallop Abdomen: soft, non-tender; bowel sounds normal; no masses,  no organomegaly Pulses: 2+ and symmetric Skin: Skin color, texture, turgor normal. No rashes or lesions Lymph nodes: Cervical, supraclavicular, and axillary nodes normal.    Assessment & Plan:   Problem List Items Addressed This Visit    Visit for preventive health examination    Annual comprehensive preventive exam was done as well as an evaluation and management of chronic conditions .  During the course of the visit the patient was educated and counseled about appropriate screening and preventive services including :  diabetes  screening, lipid analysis with projected  10 year  risk for CAD , nutrition counseling, breast, cervical and colorectal cancer screening, and recommended immunizations.  Printed recommendations for health maintenance screenings was given  .      Polyarthritis of hand    Managing with topical marijuana extract and acupuncture.      Hypothyroidism    Thyroid function is WNL on current dose.  No current changes needed.   Lab Results  Component Value Date   TSH 1.53 03/13/2017         Relevant Orders   TSH (Completed)   HLD (hyperlipidemia)    10 year risk of CAD is 5% based on today's lipid panel.  No indication for medications  Lab Results  Component Value Date   CHOL 204 (H) 03/13/2017   HDL 80.70 03/13/2017   LDLCALC 110 (H) 03/13/2017   LDLDIRECT 127.3 07/05/2013   TRIG 67.0 03/13/2017   CHOLHDL 3 03/13/2017        Relevant Orders   Lipid panel (Completed)   Esophagitis, reflux (Chronic)    Discussed current controversy regarding prolonged use of PPI in patients without documented Barretts esophagus.  Patient has no prior EGD but has been on PPI therapy for > 5 years (per patient).  Suggested trial of pepcid 20 mg daily.  If GERD symptoms return,  resume PPI        B12 deficiency - Primary    Checkinglevel and intrinsic  factor ab to determine if oral supplements will be sufficient.       Relevant Orders   Vitamin B12 (Completed)    Other Visit Diagnoses    Vitamin D deficiency       Relevant Orders   VITAMIN D 25 Hydroxy (Vit-D Deficiency, Fractures) (Completed)   Long-term use of high-risk medication       Relevant Orders   Comprehensive metabolic panel (Completed)      I have discontinued Ms. Babin's cetirizine, triamcinolone cream, azithromycin, levofloxacin, and oseltamivir. I am also having her start on famotidine. Additionally, I am having her maintain her multivitamin, Calcium Carbonate-Vitamin D (CALCIUM 600 + D PO), Omega-3 Fatty Acids (OMEGA 3 PO),  fluticasone, vitamin B-12, Vitamin D, vitamin E, azelastine, gabapentin, zolpidem, levothyroxine, Biotin, and omeprazole.  Meds ordered this encounter  Medications  . Biotin (SUPER BIOTIN) 5 MG TABS    Sig: Take by mouth.  Marland Kitchen omeprazole (PRILOSEC) 40 MG capsule  . famotidine (PEPCID) 20 MG tablet    Sig: Take 1 tablet (20 mg total) by mouth 2 (two) times daily.    Dispense:  60 tablet    Refill:  1    Medications Discontinued During This Encounter  Medication Reason  . azithromycin (ZITHROMAX Z-PAK) 250 MG tablet Therapy completed  .  cetirizine (ZYRTEC) 10 MG tablet Patient has not taken in last 30 days  . levofloxacin (LEVAQUIN) 500 MG tablet Patient has not taken in last 30 days  . oseltamivir (TAMIFLU) 75 MG capsule Patient has not taken in last 30 days  . triamcinolone cream (KENALOG) 0.1 % Patient has not taken in last 30 days    Follow-up: No Follow-up on file.   Crecencio Mc, MD

## 2017-03-13 NOTE — Assessment & Plan Note (Signed)
Thyroid function is WNL on current dose.  No current changes needed.   Lab Results  Component Value Date   TSH 1.53 03/13/2017

## 2017-03-13 NOTE — Patient Instructions (Addendum)
  We discussed my concern about continuing your PPI (Prilosec) indeficnitely in light of the recently published studies suggesting an association with increased risk of dementia and kidney failure.  I advised  you to try using  famotidine 20 mg  twice daily, and I sent  an rx to your pharmacy.    if your reflux symptoms are controlled,  You can Continue the daily h2 blocker. If not,  Resume daily prilosec .     I recommend getting the majority of your calcium and Vitamin D  through diet rather than supplements given the recent association of calcium supplements with increased coronary artery calcium scores  Try the almond and cashew milks that most grocery stores  now carry  in the dairy  Section   They are lactose free:  Silk brand Almond Light,  Original formula.  Delicious,  Low carb,  Low cal,  Cholesterol free   Hair  Skin and Nails type supplements are fine to take (they contain biotin)    The ShingRx vaccine  IS ADVISED for all interested adults over 50 to prevent shingles.  It I much more effective than Zostavax  GO OUT OF TOWN TO AN [facility]!

## 2017-03-13 NOTE — Assessment & Plan Note (Signed)
Annual comprehensive preventive exam was done as well as an evaluation and management of chronic conditions .  During the course of the visit the patient was educated and counseled about appropriate screening and preventive services including :  diabetes screening, lipid analysis with projected  10 year  risk for CAD , nutrition counseling, breast, cervical and colorectal cancer screening, and recommended immunizations.  Printed recommendations for health maintenance screenings was given 

## 2017-03-13 NOTE — Assessment & Plan Note (Addendum)
10 year risk of CAD is 5% based on today's lipid panel.  No indication for medications  Lab Results  Component Value Date   CHOL 204 (H) 03/13/2017   HDL 80.70 03/13/2017   LDLCALC 110 (H) 03/13/2017   LDLDIRECT 127.3 07/05/2013   TRIG 67.0 03/13/2017   CHOLHDL 3 03/13/2017

## 2017-03-14 ENCOUNTER — Encounter: Payer: Self-pay | Admitting: Internal Medicine

## 2017-03-14 DIAGNOSIS — Z79899 Other long term (current) drug therapy: Secondary | ICD-10-CM | POA: Diagnosis not present

## 2017-04-09 DIAGNOSIS — R69 Illness, unspecified: Secondary | ICD-10-CM | POA: Diagnosis not present

## 2017-05-05 DIAGNOSIS — M9904 Segmental and somatic dysfunction of sacral region: Secondary | ICD-10-CM | POA: Diagnosis not present

## 2017-05-05 DIAGNOSIS — M791 Myalgia: Secondary | ICD-10-CM | POA: Diagnosis not present

## 2017-05-05 DIAGNOSIS — M545 Low back pain: Secondary | ICD-10-CM | POA: Diagnosis not present

## 2017-05-05 DIAGNOSIS — M9903 Segmental and somatic dysfunction of lumbar region: Secondary | ICD-10-CM | POA: Diagnosis not present

## 2017-05-05 DIAGNOSIS — M5137 Other intervertebral disc degeneration, lumbosacral region: Secondary | ICD-10-CM | POA: Diagnosis not present

## 2017-05-05 DIAGNOSIS — M5416 Radiculopathy, lumbar region: Secondary | ICD-10-CM | POA: Diagnosis not present

## 2017-05-05 DIAGNOSIS — M5136 Other intervertebral disc degeneration, lumbar region: Secondary | ICD-10-CM | POA: Diagnosis not present

## 2017-05-05 DIAGNOSIS — M5417 Radiculopathy, lumbosacral region: Secondary | ICD-10-CM | POA: Diagnosis not present

## 2017-05-07 DIAGNOSIS — M5136 Other intervertebral disc degeneration, lumbar region: Secondary | ICD-10-CM | POA: Diagnosis not present

## 2017-05-07 DIAGNOSIS — M5137 Other intervertebral disc degeneration, lumbosacral region: Secondary | ICD-10-CM | POA: Diagnosis not present

## 2017-05-07 DIAGNOSIS — M5417 Radiculopathy, lumbosacral region: Secondary | ICD-10-CM | POA: Diagnosis not present

## 2017-05-07 DIAGNOSIS — M791 Myalgia: Secondary | ICD-10-CM | POA: Diagnosis not present

## 2017-05-07 DIAGNOSIS — M545 Low back pain: Secondary | ICD-10-CM | POA: Diagnosis not present

## 2017-05-07 DIAGNOSIS — M5416 Radiculopathy, lumbar region: Secondary | ICD-10-CM | POA: Diagnosis not present

## 2017-05-07 DIAGNOSIS — M9903 Segmental and somatic dysfunction of lumbar region: Secondary | ICD-10-CM | POA: Diagnosis not present

## 2017-05-07 DIAGNOSIS — M9904 Segmental and somatic dysfunction of sacral region: Secondary | ICD-10-CM | POA: Diagnosis not present

## 2017-05-09 DIAGNOSIS — M9903 Segmental and somatic dysfunction of lumbar region: Secondary | ICD-10-CM | POA: Diagnosis not present

## 2017-05-09 DIAGNOSIS — M5136 Other intervertebral disc degeneration, lumbar region: Secondary | ICD-10-CM | POA: Diagnosis not present

## 2017-05-09 DIAGNOSIS — M5417 Radiculopathy, lumbosacral region: Secondary | ICD-10-CM | POA: Diagnosis not present

## 2017-05-09 DIAGNOSIS — M791 Myalgia: Secondary | ICD-10-CM | POA: Diagnosis not present

## 2017-05-09 DIAGNOSIS — M5416 Radiculopathy, lumbar region: Secondary | ICD-10-CM | POA: Diagnosis not present

## 2017-05-09 DIAGNOSIS — M545 Low back pain: Secondary | ICD-10-CM | POA: Diagnosis not present

## 2017-05-09 DIAGNOSIS — M5137 Other intervertebral disc degeneration, lumbosacral region: Secondary | ICD-10-CM | POA: Diagnosis not present

## 2017-05-09 DIAGNOSIS — M9904 Segmental and somatic dysfunction of sacral region: Secondary | ICD-10-CM | POA: Diagnosis not present

## 2017-05-12 DIAGNOSIS — M5416 Radiculopathy, lumbar region: Secondary | ICD-10-CM | POA: Diagnosis not present

## 2017-05-12 DIAGNOSIS — M9904 Segmental and somatic dysfunction of sacral region: Secondary | ICD-10-CM | POA: Diagnosis not present

## 2017-05-12 DIAGNOSIS — M5417 Radiculopathy, lumbosacral region: Secondary | ICD-10-CM | POA: Diagnosis not present

## 2017-05-12 DIAGNOSIS — M9903 Segmental and somatic dysfunction of lumbar region: Secondary | ICD-10-CM | POA: Diagnosis not present

## 2017-05-12 DIAGNOSIS — M5136 Other intervertebral disc degeneration, lumbar region: Secondary | ICD-10-CM | POA: Diagnosis not present

## 2017-05-12 DIAGNOSIS — M545 Low back pain: Secondary | ICD-10-CM | POA: Diagnosis not present

## 2017-05-12 DIAGNOSIS — M791 Myalgia: Secondary | ICD-10-CM | POA: Diagnosis not present

## 2017-05-12 DIAGNOSIS — M5137 Other intervertebral disc degeneration, lumbosacral region: Secondary | ICD-10-CM | POA: Diagnosis not present

## 2017-05-14 DIAGNOSIS — M9904 Segmental and somatic dysfunction of sacral region: Secondary | ICD-10-CM | POA: Diagnosis not present

## 2017-05-14 DIAGNOSIS — M5137 Other intervertebral disc degeneration, lumbosacral region: Secondary | ICD-10-CM | POA: Diagnosis not present

## 2017-05-14 DIAGNOSIS — M5416 Radiculopathy, lumbar region: Secondary | ICD-10-CM | POA: Diagnosis not present

## 2017-05-14 DIAGNOSIS — M545 Low back pain: Secondary | ICD-10-CM | POA: Diagnosis not present

## 2017-05-14 DIAGNOSIS — M791 Myalgia: Secondary | ICD-10-CM | POA: Diagnosis not present

## 2017-05-14 DIAGNOSIS — M5136 Other intervertebral disc degeneration, lumbar region: Secondary | ICD-10-CM | POA: Diagnosis not present

## 2017-05-14 DIAGNOSIS — M9903 Segmental and somatic dysfunction of lumbar region: Secondary | ICD-10-CM | POA: Diagnosis not present

## 2017-05-14 DIAGNOSIS — M5417 Radiculopathy, lumbosacral region: Secondary | ICD-10-CM | POA: Diagnosis not present

## 2017-05-16 DIAGNOSIS — M9904 Segmental and somatic dysfunction of sacral region: Secondary | ICD-10-CM | POA: Diagnosis not present

## 2017-05-16 DIAGNOSIS — M5136 Other intervertebral disc degeneration, lumbar region: Secondary | ICD-10-CM | POA: Diagnosis not present

## 2017-05-16 DIAGNOSIS — M5416 Radiculopathy, lumbar region: Secondary | ICD-10-CM | POA: Diagnosis not present

## 2017-05-16 DIAGNOSIS — M545 Low back pain: Secondary | ICD-10-CM | POA: Diagnosis not present

## 2017-05-16 DIAGNOSIS — M791 Myalgia: Secondary | ICD-10-CM | POA: Diagnosis not present

## 2017-05-16 DIAGNOSIS — M5137 Other intervertebral disc degeneration, lumbosacral region: Secondary | ICD-10-CM | POA: Diagnosis not present

## 2017-05-16 DIAGNOSIS — M9903 Segmental and somatic dysfunction of lumbar region: Secondary | ICD-10-CM | POA: Diagnosis not present

## 2017-05-16 DIAGNOSIS — M5417 Radiculopathy, lumbosacral region: Secondary | ICD-10-CM | POA: Diagnosis not present

## 2017-05-19 DIAGNOSIS — M9904 Segmental and somatic dysfunction of sacral region: Secondary | ICD-10-CM | POA: Diagnosis not present

## 2017-05-19 DIAGNOSIS — M5137 Other intervertebral disc degeneration, lumbosacral region: Secondary | ICD-10-CM | POA: Diagnosis not present

## 2017-05-19 DIAGNOSIS — M9903 Segmental and somatic dysfunction of lumbar region: Secondary | ICD-10-CM | POA: Diagnosis not present

## 2017-05-19 DIAGNOSIS — M791 Myalgia: Secondary | ICD-10-CM | POA: Diagnosis not present

## 2017-05-19 DIAGNOSIS — M5417 Radiculopathy, lumbosacral region: Secondary | ICD-10-CM | POA: Diagnosis not present

## 2017-05-19 DIAGNOSIS — M5136 Other intervertebral disc degeneration, lumbar region: Secondary | ICD-10-CM | POA: Diagnosis not present

## 2017-05-19 DIAGNOSIS — M5416 Radiculopathy, lumbar region: Secondary | ICD-10-CM | POA: Diagnosis not present

## 2017-05-19 DIAGNOSIS — M545 Low back pain: Secondary | ICD-10-CM | POA: Diagnosis not present

## 2017-05-21 DIAGNOSIS — M545 Low back pain: Secondary | ICD-10-CM | POA: Diagnosis not present

## 2017-05-21 DIAGNOSIS — M9904 Segmental and somatic dysfunction of sacral region: Secondary | ICD-10-CM | POA: Diagnosis not present

## 2017-05-21 DIAGNOSIS — M5136 Other intervertebral disc degeneration, lumbar region: Secondary | ICD-10-CM | POA: Diagnosis not present

## 2017-05-21 DIAGNOSIS — M5137 Other intervertebral disc degeneration, lumbosacral region: Secondary | ICD-10-CM | POA: Diagnosis not present

## 2017-05-21 DIAGNOSIS — M5417 Radiculopathy, lumbosacral region: Secondary | ICD-10-CM | POA: Diagnosis not present

## 2017-05-21 DIAGNOSIS — M9903 Segmental and somatic dysfunction of lumbar region: Secondary | ICD-10-CM | POA: Diagnosis not present

## 2017-05-21 DIAGNOSIS — M791 Myalgia: Secondary | ICD-10-CM | POA: Diagnosis not present

## 2017-05-21 DIAGNOSIS — M5416 Radiculopathy, lumbar region: Secondary | ICD-10-CM | POA: Diagnosis not present

## 2017-05-23 DIAGNOSIS — M9903 Segmental and somatic dysfunction of lumbar region: Secondary | ICD-10-CM | POA: Diagnosis not present

## 2017-05-23 DIAGNOSIS — M5416 Radiculopathy, lumbar region: Secondary | ICD-10-CM | POA: Diagnosis not present

## 2017-05-23 DIAGNOSIS — M9904 Segmental and somatic dysfunction of sacral region: Secondary | ICD-10-CM | POA: Diagnosis not present

## 2017-05-23 DIAGNOSIS — M791 Myalgia: Secondary | ICD-10-CM | POA: Diagnosis not present

## 2017-05-23 DIAGNOSIS — M5137 Other intervertebral disc degeneration, lumbosacral region: Secondary | ICD-10-CM | POA: Diagnosis not present

## 2017-05-23 DIAGNOSIS — M545 Low back pain: Secondary | ICD-10-CM | POA: Diagnosis not present

## 2017-05-23 DIAGNOSIS — M5136 Other intervertebral disc degeneration, lumbar region: Secondary | ICD-10-CM | POA: Diagnosis not present

## 2017-05-23 DIAGNOSIS — M5417 Radiculopathy, lumbosacral region: Secondary | ICD-10-CM | POA: Diagnosis not present

## 2017-05-26 DIAGNOSIS — M545 Low back pain: Secondary | ICD-10-CM | POA: Diagnosis not present

## 2017-05-26 DIAGNOSIS — M9903 Segmental and somatic dysfunction of lumbar region: Secondary | ICD-10-CM | POA: Diagnosis not present

## 2017-05-26 DIAGNOSIS — M791 Myalgia: Secondary | ICD-10-CM | POA: Diagnosis not present

## 2017-05-26 DIAGNOSIS — M9904 Segmental and somatic dysfunction of sacral region: Secondary | ICD-10-CM | POA: Diagnosis not present

## 2017-05-26 DIAGNOSIS — M5416 Radiculopathy, lumbar region: Secondary | ICD-10-CM | POA: Diagnosis not present

## 2017-05-26 DIAGNOSIS — M5136 Other intervertebral disc degeneration, lumbar region: Secondary | ICD-10-CM | POA: Diagnosis not present

## 2017-05-26 DIAGNOSIS — M5137 Other intervertebral disc degeneration, lumbosacral region: Secondary | ICD-10-CM | POA: Diagnosis not present

## 2017-05-26 DIAGNOSIS — M5417 Radiculopathy, lumbosacral region: Secondary | ICD-10-CM | POA: Diagnosis not present

## 2017-05-28 DIAGNOSIS — M545 Low back pain: Secondary | ICD-10-CM | POA: Diagnosis not present

## 2017-05-28 DIAGNOSIS — M5136 Other intervertebral disc degeneration, lumbar region: Secondary | ICD-10-CM | POA: Diagnosis not present

## 2017-05-28 DIAGNOSIS — M5417 Radiculopathy, lumbosacral region: Secondary | ICD-10-CM | POA: Diagnosis not present

## 2017-05-28 DIAGNOSIS — M9904 Segmental and somatic dysfunction of sacral region: Secondary | ICD-10-CM | POA: Diagnosis not present

## 2017-05-28 DIAGNOSIS — M5416 Radiculopathy, lumbar region: Secondary | ICD-10-CM | POA: Diagnosis not present

## 2017-05-28 DIAGNOSIS — M9903 Segmental and somatic dysfunction of lumbar region: Secondary | ICD-10-CM | POA: Diagnosis not present

## 2017-05-28 DIAGNOSIS — M5137 Other intervertebral disc degeneration, lumbosacral region: Secondary | ICD-10-CM | POA: Diagnosis not present

## 2017-05-28 DIAGNOSIS — M791 Myalgia: Secondary | ICD-10-CM | POA: Diagnosis not present

## 2017-06-03 ENCOUNTER — Other Ambulatory Visit: Payer: Self-pay | Admitting: Internal Medicine

## 2017-06-03 DIAGNOSIS — M9903 Segmental and somatic dysfunction of lumbar region: Secondary | ICD-10-CM | POA: Diagnosis not present

## 2017-06-03 DIAGNOSIS — M5137 Other intervertebral disc degeneration, lumbosacral region: Secondary | ICD-10-CM | POA: Diagnosis not present

## 2017-06-03 DIAGNOSIS — M5417 Radiculopathy, lumbosacral region: Secondary | ICD-10-CM | POA: Diagnosis not present

## 2017-06-03 DIAGNOSIS — M9904 Segmental and somatic dysfunction of sacral region: Secondary | ICD-10-CM | POA: Diagnosis not present

## 2017-06-03 DIAGNOSIS — M5136 Other intervertebral disc degeneration, lumbar region: Secondary | ICD-10-CM | POA: Diagnosis not present

## 2017-06-03 DIAGNOSIS — M5416 Radiculopathy, lumbar region: Secondary | ICD-10-CM | POA: Diagnosis not present

## 2017-06-03 DIAGNOSIS — M791 Myalgia: Secondary | ICD-10-CM | POA: Diagnosis not present

## 2017-06-03 DIAGNOSIS — M545 Low back pain: Secondary | ICD-10-CM | POA: Diagnosis not present

## 2017-06-26 DIAGNOSIS — R69 Illness, unspecified: Secondary | ICD-10-CM | POA: Diagnosis not present

## 2017-07-04 ENCOUNTER — Telehealth: Payer: Self-pay | Admitting: Family Medicine

## 2017-07-04 NOTE — Telephone Encounter (Signed)
Pt called requesting a refill on gabapentin (NEURONTIN) 100 MG capsule to be sent to [facility].

## 2017-07-07 NOTE — Telephone Encounter (Signed)
lmovm for pt to return call.  Unable to refill gabapentin since Dr. Tamala Julian has not seen her in over a year.

## 2017-07-10 ENCOUNTER — Encounter: Payer: Self-pay | Admitting: Family Medicine

## 2017-07-10 ENCOUNTER — Ambulatory Visit (INDEPENDENT_AMBULATORY_CARE_PROVIDER_SITE_OTHER): Payer: Medicare HMO | Admitting: Family Medicine

## 2017-07-10 DIAGNOSIS — M5136 Other intervertebral disc degeneration, lumbar region: Secondary | ICD-10-CM

## 2017-07-10 MED ORDER — PREDNISONE 50 MG PO TABS: 50.0000 mg | ORAL_TABLET | Freq: Every day | ORAL | 0 refills | Status: DC

## 2017-07-10 MED ORDER — TIZANIDINE HCL 2 MG PO CAPS: 2.0000 mg | ORAL_CAPSULE | Freq: Three times a day (TID) | ORAL | 3 refills | Status: DC | PRN

## 2017-07-10 MED ORDER — GABAPENTIN 100 MG PO CAPS: 200.0000 mg | ORAL_CAPSULE | Freq: Every day | ORAL | 3 refills | Status: DC

## 2017-07-10 NOTE — Assessment & Plan Note (Signed)
Patient does have severe degenerative disc disease. Discussed with patient at great length. We discussed icing regimen and home exercises. Patient will start to increase activity slowly over the course of next several days. Refilled patient's gabapentin given muscle relaxer as well as prednisone for any type breakthrough pain. Patient will be traveling and we can refill as needed but did not want to do prednisone more than 3 times within a 12 month period. Any significant worsening symptoms she would be a candidate for an epidural steroid injection at think would be a good idea. Patient has not a repeat formal physical therapy at this time.

## 2017-07-10 NOTE — Patient Instructions (Signed)
Good to see you  Gabapentin 100-300mg  at night Zanaflex is a muscle relaxer when in a lot of pain and can take it up to 3 times a day  Prednisone daily for 5 days when very bad and we will make sure you have it for your trip  Ice 20 minutes 2 times daily. Usually after activity and before bed. Exercises 3 times a week.  See me again if worsening pain or as needed. We can always try epidural injecitons as well.  Have a great trip!

## 2017-07-10 NOTE — Progress Notes (Signed)
[facility] [place], [place] [address] Phone: [phone] Subjective:    I'm seeing this patient by the request  of:    CC: Low back pain follow-up  [id number]  Tammy Tate is a 76 y.o. female coming in with complaint of low back pain. Seen 1 year ago. Patient underwent MRI and did have scoliosis with multilevel degenerative changes. Imaging was independently visualized by me showing also a high-grade spinal stenosis mostly at L4-L5. Patient did very well with conservative therapy including physical therapy and started on gabapentin. Patient states Pain is seeming to be worsening again. Patient has not been doing the exercises as well as she had been doing in the past. Starting have increasing discomfort. Was doing with the gabapentin but needs a refill.      Past Medical History:  Diagnosis Date  . Allergy   . B12 deficiency   . Bronchitis    hx of recurrent and bronchospasm  . Cancer [facility]) 2010   DCIS , right  . Chicken pox   . Degenerative arthritis of lumbar spine   . Degenerative disk disease    Tammy Tate 08-04-2007  . GERD (gastroesophageal reflux disease)   . Migraine with aura   . Thyroid disease   . Vasomotor rhinitis    Past Surgical History:  Procedure Laterality Date  . ABDOMINAL HYSTERECTOMY     with prior HRT  . APPENDECTOMY    . MODIFIED RADICAL MASTECTOMY W/ AXILLARY LYMPH NODE DISSECTION W/ PECTORALIS MINOR PRESERVATION  2010   bilateral, for DCIS,  Tammy Tate , [facility]   Social History   Social History  . Marital status: Married    Spouse name: N/A  . Number of children: N/A  . Years of education: N/A   Social History Main Topics  . Smoking status: Former Smoker    Quit date: 10/23/1971  . Smokeless tobacco: Never Used  . Alcohol use 4.2 oz/week    7 Glasses of wine per week     Comment: occasional  . Drug use: No  . Sexual activity: Not Currently   Other Topics Concern  . None   Social History  Narrative  . None   No Known Allergies Family History  Problem Relation Age of Onset  . Cancer Mother 84       esophgeal CA mets to lungs, spine   . Stroke Father 69       tobacco abuse, hyperlipidemia  . Hyperlipidemia Father   . Heart disease Father   . Diabetes Maternal Grandfather      Past medical history, social, surgical and family history all reviewed in electronic medical record.  No pertanent information unless stated regarding to the chief complaint.   Review of Systems:Review of systems updated and as accurate as of 07/10/17  No headache, visual changes, nausea, vomiting, diarrhea, constipation, dizziness, abdominal pain, skin rash, fevers, chills, night sweats, weight loss, swollen lymph nodes, body aches, joint swelling,  chest pain, shortness of breath, mood changes. Positive muscle aches  Objective  Blood pressure (!) 150/70, pulse 73, height 5\' 3"  (1.6 m), weight 169 lb (76.7 kg), SpO2 90 %. Systems examined below as of 07/10/17   General: No apparent distress alert and oriented x3 mood and affect normal, dressed appropriately.  HEENT: Pupils equal, extraocular movements intact  Respiratory: Patient's speak in full sentences and does not appear short of breath  Cardiovascular: No lower extremity edema, non tender, no erythema  Skin: Warm dry intact with no signs of infection or rash on extremities or on axial skeleton.  Abdomen: Soft nontender  Neuro: Cranial nerves II through XII are intact, neurovascularly intact in all extremities with 2+ DTRs and 2+ pulses.  Lymph: No lymphadenopathy of posterior or anterior cervical chain or axillae bilaterally.  Gait normal with good balance and coordination.  MSK:  Non tender with full range of motion and good stability and symmetric strength and tone of shoulders, elbows, wrist, hip, knee and ankles bilaterally.  Back Exam:  Inspection: Degenerative scoliosis noted Motion: Flexion 45 deg, Extension 25 deg, Side Bending to  35 deg bilaterally,  Rotation to 35 deg bilaterally  SLR laying: Negative  XSLR laying: Negative  Palpable tenderness: Tender to palpation in the paraspinal musculature.. More on the left side of the sacroiliac FABER: Significant tightness bilaterally left couldn't and right. Sensory change: Gross sensation intact to all lumbar and sacral dermatomes.  Reflexes: 2+ at both patellar tendons, 2+ at achilles tendons, Babinski's downgoing.  Strength at foot  Plantar-flexion: 5/5 Dorsi-flexion: 5/5 Eversion: 5/5 Inversion: 5/5  Leg strength  Quad: 5/5 Hamstring: 5/5 Hip flexor: 5/5 Hip abductors: 4/5  Gait unremarkable.  Procedure note [id number]; 15 additional minutes spent for Therapeutic exercises as stated in above notes.  This included exercises focusing on stretching, strengthening, with significant focus on eccentric aspects.   Long term goals include an improvement in range of motion, strength, endurance as well as avoiding reinjury. Patient's frequency would include in 1-2 times a day, 3-5 times a week for a duration of 6-12 weeks. Low back exercises that included:  Pelvic tilt/bracing instruction to focus on control of the pelvic girdle and lower abdominal muscles  Glute strengthening exercises, focusing on proper firing of the glutes without engaging the low back muscles Proper stretching techniques for maximum relief for the hamstrings, hip flexors, low back and some rotation where tolerated    Proper technique shown and discussed handout in great detail with ATC.  All questions were discussed and answered.     Impression and Recommendations:     This case required medical decision making of moderate complexity.      Note: This dictation was prepared with Dragon dictation along with smaller phrase technology. Any transcriptional errors that result from this process are unintentional.

## 2017-07-16 ENCOUNTER — Other Ambulatory Visit: Payer: Self-pay | Admitting: *Deleted

## 2017-07-16 MED ORDER — TIZANIDINE HCL 2 MG PO CAPS: 2.0000 mg | ORAL_CAPSULE | Freq: Three times a day (TID) | ORAL | 3 refills | Status: DC | PRN

## 2017-07-16 MED ORDER — PREDNISONE 50 MG PO TABS: 50.0000 mg | ORAL_TABLET | Freq: Every day | ORAL | 0 refills | Status: DC

## 2017-07-16 MED ORDER — GABAPENTIN 100 MG PO CAPS: 200.0000 mg | ORAL_CAPSULE | Freq: Every day | ORAL | 3 refills | Status: DC

## 2017-07-16 NOTE — Telephone Encounter (Signed)
Rx resent to pharmacy

## 2017-08-19 DIAGNOSIS — D2261 Melanocytic nevi of right upper limb, including shoulder: Secondary | ICD-10-CM | POA: Diagnosis not present

## 2017-08-19 DIAGNOSIS — L570 Actinic keratosis: Secondary | ICD-10-CM | POA: Diagnosis not present

## 2017-08-19 DIAGNOSIS — D2272 Melanocytic nevi of left lower limb, including hip: Secondary | ICD-10-CM | POA: Diagnosis not present

## 2017-08-19 DIAGNOSIS — D225 Melanocytic nevi of trunk: Secondary | ICD-10-CM | POA: Diagnosis not present

## 2017-08-19 DIAGNOSIS — Z85828 Personal history of other malignant neoplasm of skin: Secondary | ICD-10-CM | POA: Diagnosis not present

## 2017-08-19 DIAGNOSIS — X32XXXA Exposure to sunlight, initial encounter: Secondary | ICD-10-CM | POA: Diagnosis not present

## 2017-08-27 DIAGNOSIS — H1131 Conjunctival hemorrhage, right eye: Secondary | ICD-10-CM | POA: Diagnosis not present

## 2017-09-02 ENCOUNTER — Other Ambulatory Visit: Payer: Self-pay

## 2017-09-02 ENCOUNTER — Ambulatory Visit (INDEPENDENT_AMBULATORY_CARE_PROVIDER_SITE_OTHER): Payer: Medicare HMO

## 2017-09-02 VITALS — BP 120/80 | HR 65 | Temp 98.1°F | Ht 62.0 in | Wt 169.8 lb

## 2017-09-02 DIAGNOSIS — Z0000 Encounter for general adult medical examination without abnormal findings: Secondary | ICD-10-CM

## 2017-09-02 DIAGNOSIS — Z1331 Encounter for screening for depression: Secondary | ICD-10-CM | POA: Diagnosis not present

## 2017-09-02 NOTE — Progress Notes (Signed)
Subjective:   Tammy Tate is a 76 y.o. female who presents for Medicare Annual (Subsequent) preventive examination.  Review of Systems:  No ROS.  Medicare Wellness Visit. Additional risk factors are reflected in the social history.  Cardiac Risk Factors include: advanced age (>5men, >24 women)     Objective:     Vitals: BP 120/80   Pulse 65   Temp 98.1 F (36.7 C) (Oral)   Ht 5\' 2"  (1.575 m)   Wt 169 lb 12.8 oz (77 kg)   SpO2 98%   BMI 31.06 kg/m   Body mass index is 31.06 kg/m.  Advanced Directives 09/02/2017 06/17/2016 02/09/2015  Does Patient Have a Medical Advance Directive? Yes Yes Yes  Type of Advance Directive Living will;Healthcare Power of [place];Living will Living will;Healthcare Power of [place];Advance instruction for mental health treatment  Does patient want to make changes to medical advance directive? No - Patient declined - Yes - information given  Copy of [place] in Chart? No - copy requested No - copy requested No - copy requested    Tobacco Social History   Tobacco Use  Smoking Status Former Smoker  . Last attempt to quit: 10/23/1971  . Years since quitting: 45.8  Smokeless Tobacco Never Used     Counseling given: Not Answered   Clinical Intake:  Pre-visit preparation completed: Yes  Pain : No/denies pain     Diabetes: No  Activities of Daily Living: Independent Ambulation: Independent Medication Administration: Independent Home Management: Independent     Do you feel unsafe in your current relationship?: No Do you feel physically threatened by others?: No Anyone hurting you at home, work, or school?: No Unable to ask?: No Information provided on Community resources: No  How often do you need to have someone help you when you read instructions, pamphlets, or other written materials from your doctor or pharmacy?: 1 - Never  Interpreter Needed?: No     Past Medical  History:  Diagnosis Date  . Allergy   . B12 deficiency   . Bronchitis    hx of recurrent and bronchospasm  . Cancer [facility] 2010   DCIS , right  . Chicken pox   . Degenerative arthritis of lumbar spine   . Degenerative disk disease    KAM 08-04-2007  . GERD (gastroesophageal reflux disease)   . Migraine with aura   . Thyroid disease   . Vasomotor rhinitis    Past Surgical History:  Procedure Laterality Date  . ABDOMINAL HYSTERECTOMY     with prior HRT  . APPENDECTOMY    . MODIFIED RADICAL MASTECTOMY W/ AXILLARY LYMPH NODE DISSECTION W/ PECTORALIS MINOR PRESERVATION  2010   bilateral, for DCIS,  [name] , [facility]   Family History  Problem Relation Age of Onset  . Cancer Mother 66       esophgeal CA mets to lungs, spine   . Stroke Father 66       tobacco abuse, hyperlipidemia  . Hyperlipidemia Father   . Heart disease Father   . Diabetes Maternal Grandfather    Social History   Socioeconomic History  . Marital status: Married    Spouse name: None  . Number of children: None  . Years of education: None  . Highest education level: None  Social Needs  . Financial resource strain: None  . Food insecurity - worry: None  . Food insecurity - inability: None  . Transportation needs - medical:  None  . Transportation needs - non-medical: None  Occupational History  . None  Tobacco Use  . Smoking status: Former Smoker    Last attempt to quit: 10/23/1971    Years since quitting: 45.8  . Smokeless tobacco: Never Used  Substance and Sexual Activity  . Alcohol use: Yes    Alcohol/week: 4.2 oz    Types: 7 Glasses of wine per week    Comment: occasional  . Drug use: No  . Sexual activity: Not Currently  Other Topics Concern  . None  Social History Narrative  . None    Outpatient Encounter Medications as of 09/02/2017  Medication Sig  . Biotin (SUPER BIOTIN) 5 MG TABS Take by mouth.  . Cholecalciferol (VITAMIN D) 2000 UNITS tablet Take 2,000 Units by mouth daily.  .  famotidine (PEPCID) 20 MG tablet Take 1 tablet (20 mg total) by mouth 2 (two) times daily.  Marland Kitchen gabapentin (NEURONTIN) 100 MG capsule Take 2 capsules (200 mg total) by mouth at bedtime.  Marland Kitchen levothyroxine (SYNTHROID, LEVOTHROID) 88 MCG tablet TAKE 1 TABLET BY MOUTH DAILY  . Multiple Vitamin (MULTIVITAMIN) tablet Take 1 tablet by mouth daily.  . Omega-3 Fatty Acids (OMEGA 3 PO) Take by mouth daily.  . predniSONE (DELTASONE) 50 MG tablet Take 1 tablet (50 mg total) by mouth daily.  . tizanidine (ZANAFLEX) 2 MG capsule Take 1 capsule (2 mg total) by mouth 3 (three) times daily as needed for muscle spasms.  . vitamin B-12 (CYANOCOBALAMIN) 1000 MCG tablet Take 1,000 mcg by mouth daily.  . vitamin E (VITAMIN E) 400 UNIT capsule Take 400 Units by mouth daily.  Marland Kitchen zolpidem (AMBIEN) 10 MG tablet TAKE ONE TABLET AT BEDTIME AS NEEDED  . [DISCONTINUED] azelastine (OPTIVAR) 0.05 % ophthalmic solution Place 1 drop into both eyes 2 (two) times daily. (Patient not taking: Reported on 07/10/2017)  . [DISCONTINUED] Calcium Carbonate-Vitamin D (CALCIUM 600 + D PO) Take by mouth 2 (two) times daily.  . [DISCONTINUED] fluticasone (FLONASE) 50 MCG/ACT nasal spray Place 2 sprays into the nose daily.  . [DISCONTINUED] gabapentin (NEURONTIN) 100 MG capsule TAKE ONE CAPSULE AT BEDTIME  . [DISCONTINUED] omeprazole (PRILOSEC) 40 MG capsule    No facility-administered encounter medications on file as of 09/02/2017.     Activities of Daily Living In your present state of health, do you have any difficulty performing the following activities: 09/02/2017  Hearing? N  Vision? N  Walking or climbing stairs? Y  Dressing or bathing? N  Doing errands, shopping? N  Preparing Food and eating ? N  Using the Toilet? N  In the past six months, have you accidently leaked urine? N  Do you have problems with loss of bowel control? N  Managing your Medications? N  Managing your Finances? N  Housekeeping or managing your Housekeeping? Y    Comment Pace self; chronic back pain  Some recent data might be hidden    Patient Care Team: Crecencio Mc, MD as PCP - General (Internal Medicine)    Assessment:    This is a routine wellness examination for Tammy Tate. The goal of the wellness visit is to assist the patient how to close the gaps in care and create a preventative care plan for the patient.   The roster of all physicians providing medical care to patient is listed in the Snapshot section of the chart.  Osteoporosis risk reviewed.    Safety issues reviewed; Smoke and carbon monoxide detectors in the home. No firearms  in the home.  Wears seatbelts when driving or riding with others. Patient does wear sunscreen or protective clothing when in direct sunlight. No violence in the home.  Depression- PHQ 2 &9 complete.  No signs/symptoms or verbal communication regarding little pleasure in doing things, feeling down, depressed or hopeless. No changes in sleeping, energy, eating, concentrating.  No thoughts of self harm or harm towards others.  Time spent on this topic is 8 minutes.   Patient is alert, normal appearance, oriented to person/place/and time. Correctly identified the president of the [place], recall of 3/3 words, and performing simple calculations. Displays appropriate judgement and can read correct time from watch face.   No new identified risk were noted.  No failures at ADL's or IADL's.    BMI- discussed the importance of a healthy diet, water intake and the benefits of aerobic exercise. Educational material provided.   24 hour diet recall: Low carb foods  Daily fluid intake: 2 cups of caffeine, 2 cups of water  Dental- every 8 months.  Dr. Toy Cookey.  Eye- Visual acuity not assessed per patient preference since they have regular follow up with the ophthalmologist.  Wears corrective lenses.  Sleep patterns- Sleeps 7 hours at night.  Wakes feeling rested. She is not taking Ambien on a regular  schedule.  Patient Concerns: None at this time. Follow up with PCP as needed.  Exercise Activities and Dietary recommendations Current Exercise Habits: Home exercise routine, Type of exercise: walking;stretching, Time (Minutes): 20, Frequency (Times/Week): 4, Weekly Exercise (Minutes/Week): 80, Intensity: Mild  Goals    . Healthy Lifestyle     Exercise/stretch as tolerated. Walk for exercise. Low carb foods. Stay hydrated.      Fall Risk Fall Risk  09/02/2017 06/17/2016 03/09/2015 03/05/2014 03/02/2014  Falls in the past year? No No No No No   Depression Screen PHQ 2/9 Scores 09/02/2017 06/17/2016 03/09/2015 03/05/2014  PHQ - 2 Score 0 0 0 0     Cognitive Function MMSE - Mini Mental State Exam 06/17/2016  Orientation to time 5  Orientation to Place 5  Registration 3  Attention/ Calculation 5  Recall 3  Language- name 2 objects 2  Language- repeat 1  Language- follow 3 step command 3  Language- read & follow direction 1  Write a sentence 1  Copy design 1  Total score 30     6CIT Screen 09/02/2017  What Year? 0 points  What month? 0 points  What time? 0 points  Count back from 20 0 points  Months in reverse 0 points  Repeat phrase 0 points  Total Score 0    Immunization History  Administered Date(s) Administered  . Influenza Split 07/02/2013, 07/11/2014  . Influenza, High Dose Seasonal PF 06/17/2016, 06/26/2017  . Influenza-Unspecified 07/02/2013  . Pneumococcal Conjugate-13 03/02/2014  . Pneumococcal Polysaccharide-23 03/03/2007  . Tdap 02/25/2009  . Zoster 03/27/2011   Screening Tests Health Maintenance  Topic Date Due  . FOOT EXAM  09/29/1951  . HEMOGLOBIN A1C  12/15/2016  . URINE MICROALBUMIN  06/17/2017  . OPHTHALMOLOGY EXAM  08/27/2018  . TETANUS/TDAP  02/26/2019  . COLONOSCOPY  06/07/2024  . INFLUENZA VACCINE  Completed  . DEXA SCAN  Completed  . PNA vac Low Risk Adult  Completed      Plan:    End of life planning; Advance aging; Advanced directives  discussed. Copy of current HCPOA/Living Will requested.    I have personally reviewed and noted the following in the patient's chart:   .  Medical and social history . Use of alcohol, tobacco or illicit drugs  . Current medications and supplements . Functional ability and status . Nutritional status . Physical activity . Advanced directives . List of other physicians . Hospitalizations, surgeries, and ER visits in previous 12 months . Vitals . Screenings to include cognitive, depression, and falls . Referrals and appointments  In addition, I have reviewed and discussed with patient certain preventive protocols, quality metrics, and best practice recommendations. A written personalized care plan for preventive services as well as general preventive health recommendations were provided to patient.     Varney Biles, LPN  83/12/1960   Reviewed above information.  Agree with assessment and plan.    Dr Nicki Reaper

## 2017-09-02 NOTE — Patient Instructions (Addendum)
  Ms. Huebert , Thank you for taking time to come for your Medicare Wellness Visit. I appreciate your ongoing commitment to your health goals. Please review the following plan we discussed and let me know if I can assist you in the future.   Follow up with Dr. Derrel Nip as needed.    Bring a copy of your [place] and/or Living Will to be scanned into chart.  Let us know if you would like the Shingles vaccine in our office.  MyChart with updated pharmacy.  Drop off a copy of results from [place] [facility] Feb 2018.  Have a great day!  These are the goals we discussed: Goals    . Healthy Lifestyle     Exercise/stretch as tolerated. Walk for exercise. Low carb foods. Stay hydrated.       This is a list of the screening recommended for you and due dates:  Health Maintenance  Topic Date Due  . Complete foot exam   09/29/1951  . Hemoglobin A1C  12/15/2016  . Urine Protein Check  06/17/2017  . Eye exam for diabetics  08/27/2018  . Tetanus Vaccine  02/26/2019  . Colon Cancer Screening  06/07/2024  . Flu Shot  Completed  . DEXA scan (bone density measurement)  Completed  . Pneumonia vaccines  Completed

## 2017-09-04 ENCOUNTER — Encounter: Payer: Self-pay | Admitting: Internal Medicine

## 2017-09-04 DIAGNOSIS — H1131 Conjunctival hemorrhage, right eye: Secondary | ICD-10-CM | POA: Diagnosis not present

## 2017-09-04 DIAGNOSIS — Z791 Long term (current) use of non-steroidal anti-inflammatories (NSAID): Secondary | ICD-10-CM | POA: Diagnosis not present

## 2017-09-04 DIAGNOSIS — Z0000 Encounter for general adult medical examination without abnormal findings: Secondary | ICD-10-CM | POA: Diagnosis not present

## 2017-09-04 DIAGNOSIS — Z87891 Personal history of nicotine dependence: Secondary | ICD-10-CM | POA: Diagnosis not present

## 2017-09-04 DIAGNOSIS — M519 Unspecified thoracic, thoracolumbar and lumbosacral intervertebral disc disorder: Secondary | ICD-10-CM | POA: Diagnosis not present

## 2017-09-04 DIAGNOSIS — E039 Hypothyroidism, unspecified: Secondary | ICD-10-CM | POA: Diagnosis not present

## 2017-10-08 ENCOUNTER — Other Ambulatory Visit: Payer: Self-pay | Admitting: Internal Medicine

## 2017-10-25 ENCOUNTER — Encounter: Payer: Self-pay | Admitting: Internal Medicine

## 2017-10-25 ENCOUNTER — Encounter: Payer: Self-pay | Admitting: Family Medicine

## 2017-10-27 ENCOUNTER — Encounter: Payer: Self-pay | Admitting: Podiatry

## 2017-10-27 ENCOUNTER — Ambulatory Visit (INDEPENDENT_AMBULATORY_CARE_PROVIDER_SITE_OTHER): Payer: Medicare HMO | Admitting: Podiatry

## 2017-10-27 DIAGNOSIS — R69 Illness, unspecified: Secondary | ICD-10-CM | POA: Diagnosis not present

## 2017-10-27 DIAGNOSIS — L608 Other nail disorders: Secondary | ICD-10-CM

## 2017-10-27 DIAGNOSIS — L600 Ingrowing nail: Secondary | ICD-10-CM | POA: Diagnosis not present

## 2017-10-27 NOTE — Progress Notes (Signed)
Complaint:  Visit Type: Patient returns to my office for continued preventative foot care services. Complaint: Patient states" my nails have grown long and ingrown both big toenails." . The patient presents for preventative foot care services. No changes to ROS.  Patient has not been in for over 22 months.  Podiatric Exam: Vascular: dorsalis pedis and posterior tibial pulses are palpable bilateral. Capillary return is immediate. Temperature gradient is WNL. Skin turgor WNL  Sensorium: Normal Semmes Weinstein monofilament test. Normal tactile sensation bilaterally. Nail Exam: Pincer toenails  1,2  B/L. Ulcer Exam: There is no evidence of ulcer or pre-ulcerative changes or infection. Orthopedic Exam: Muscle tone and strength are WNL. No limitations in general ROM. No crepitus or effusions noted. Foot type and digits show no abnormalities. Bony prominences are unremarkable. Skin: No Porokeratosis. No infection or ulcers  Diagnosis:  Pincer nails  B/L , Pain in right toe, pain in left toes  Treatment & Plan Procedures and Treatment: Consent by patient was obtained for treatment procedures.   Debridement of toenails, 1 through 5 bilateral and clearing of subungual debris. No ulceration, no infection noted.  Return Visit-Office Procedure: Patient instructed to return to the office for a follow up visit 3 months for continued evaluation and treatment.    Gardiner Barefoot DPM

## 2017-10-28 MED ORDER — LEVOTHYROXINE SODIUM 88 MCG PO TABS: 88.0000 ug | ORAL_TABLET | Freq: Every day | ORAL | 3 refills | Status: DC

## 2017-10-28 MED ORDER — FAMOTIDINE 20 MG PO TABS: 20.0000 mg | ORAL_TABLET | Freq: Two times a day (BID) | ORAL | 1 refills | Status: DC

## 2017-10-28 NOTE — Telephone Encounter (Signed)
Refilled: 09/27/2016 Last OV: 03/13/2017 Next OV: 03/19/2018

## 2017-10-29 MED ORDER — ZOLPIDEM TARTRATE 10 MG PO TABS: 10.0000 mg | ORAL_TABLET | Freq: Every evening | ORAL | 0 refills | Status: DC | PRN

## 2017-11-04 ENCOUNTER — Other Ambulatory Visit: Payer: Self-pay

## 2017-11-04 ENCOUNTER — Telehealth: Payer: Self-pay

## 2017-11-04 MED ORDER — ZOLPIDEM TARTRATE 10 MG PO TABS: 10.0000 mg | ORAL_TABLET | Freq: Every evening | ORAL | 5 refills | Status: DC | PRN

## 2017-11-04 NOTE — Telephone Encounter (Signed)
PA for zolpidem has been submitted on covermymeds.  

## 2017-11-04 NOTE — Telephone Encounter (Signed)
PA has been approved. Pt and pharmacy are aware.  ?

## 2017-11-04 NOTE — Telephone Encounter (Signed)
Refilled: 10/29/2017 Last OV: 03/13/2017 Next OV: 03/19/2018

## 2017-11-05 ENCOUNTER — Encounter: Payer: Self-pay | Admitting: Family Medicine

## 2017-11-06 NOTE — Telephone Encounter (Signed)
Medication has been approved through 09/29/2018.

## 2017-11-07 ENCOUNTER — Encounter: Payer: Self-pay | Admitting: Family Medicine

## 2017-11-07 ENCOUNTER — Ambulatory Visit: Payer: Medicare HMO | Admitting: Family Medicine

## 2017-11-07 ENCOUNTER — Ambulatory Visit: Payer: Self-pay | Admitting: *Deleted

## 2017-11-07 DIAGNOSIS — M48062 Spinal stenosis, lumbar region with neurogenic claudication: Secondary | ICD-10-CM | POA: Diagnosis not present

## 2017-11-07 DIAGNOSIS — M48061 Spinal stenosis, lumbar region without neurogenic claudication: Secondary | ICD-10-CM | POA: Insufficient documentation

## 2017-11-07 MED ORDER — PREDNISONE 50 MG PO TABS: 50.0000 mg | ORAL_TABLET | Freq: Every day | ORAL | 0 refills | Status: DC

## 2017-11-07 NOTE — Telephone Encounter (Signed)
She needs to  Follow up with her eye doctor., because this does not sound like a blood pressure issue not a stroke.

## 2017-11-07 NOTE — Telephone Encounter (Signed)
Spoke with patient states left upper portion of eye felt like water was poured out of , peripheral vision was distorted  .  Blood pressure at MD appt Dr Tamala Julian office was 106/60.  Patient reports that she had eaten this am , piece of [place] tortilla with cheese on it.  Patient has history of silent migraines, however this didn't feel like one.  She reports vision in left eye is now fine and as well as right eye .  No headache, or arm numbness or indigestion. She reports she feel fine now.

## 2017-11-07 NOTE — Telephone Encounter (Signed)
Pt called because she noticed that her left upper eye vision was distorted; this change was noted after she left her MD's appointment this morning; per nurse triage recommendation made that pt go to ED; she declines offered  states that she will call her eye doctor; will route to [facility] and [facility], Dr Hulan Saas, for notification of this encounter.  Reason for Disposition . [1] Blurred vision or visual changes AND [2] present now AND [3] sudden onset or new (e.g., minutes, hours, days)  (Exception: seeing floaters / black specks OR previously diagnosed migraine headaches with same symptoms)  Answer Assessment - Initial Assessment Questions 1. DESCRIPTION: "What is the vision loss like? Describe it for me." (e.g., complete vision loss, blurred vision, double vision, floaters, etc.)     Distortion of vision in left upper eye 2. LOCATION: "One or both eyes?" If one, ask: "Which eye?"     left 3. SEVERITY: "Can you see anything?" If so, ask: "What can you see?" (e.g., fine print)     Yes; peripheral vision distorted 4. ONSET: "When did this begin?" "Did it start suddenly or has this been gradual?"     Suddenly 11/07/17 5. PATTERN: "Does this come and go, or has it been constant since it started?"     constant 6. PAIN: "Is there any pain in your eye(s)?"  (Scale 1-10; or mild, moderate, severe)     no 7. CONTACTS-GLASSES: "Do you wear contacts or glasses?"     glasses 8. CAUSE: "What do you think is causing this visual problem?"     unsure 9. OTHER SYMPTOMS: "Do you have any other symptoms?" (e.g., confusion, headache, arm or leg weakness, speech problems)     no 10. PREGNANCY: "Is there any chance you are pregnant?" "When was your last menstrual period?"       no  Protocols used: [place]

## 2017-11-07 NOTE — Telephone Encounter (Signed)
Patient advised of below she will follow up with eye MD

## 2017-11-07 NOTE — Patient Instructions (Signed)
Good to see you  Ice is your friend  You are doing well  Gabapentin try 300mg  at night Starting the day before you leave start prednisone daily for 7 days Then Duexis up to 3 times a day for 3 days if you need it pennsaid pinkie amount topically 2 times daily as needed.   Write me when you get back and tell me how you are doing .

## 2017-11-07 NOTE — Progress Notes (Signed)
[name] [place] [place], [place] [address] Phone: [phone] Subjective:      CC: Low back pain follow-up  CLE:[id number]  Tammy Tate is a 77 y.o. female coming in with complaint of low back pain.  Patient has had significant workup with an MRI that was independently visualized by patient has been doing relatively well with conservative therapy but is having some worsening pain.  Radicular symptoms going down both legs right greater than left.  Patient is even having some numbness of the right heel.  Patient is going on a long trip and will be doing a lot of hiking associated with this.  Patient is concerned about this.  Wanting to know what would be the next step.    Past Medical History:  Diagnosis Date  . Allergy   . B12 deficiency   . Bronchitis    hx of recurrent and bronchospasm  . Cancer [facility]) 2010   DCIS , right  . Chicken pox   . Degenerative arthritis of lumbar spine   . Degenerative disk disease    KAM 08-04-2007  . GERD (gastroesophageal reflux disease)   . Migraine with aura   . Thyroid disease   . Vasomotor rhinitis    Past Surgical History:  Procedure Laterality Date  . ABDOMINAL HYSTERECTOMY     with prior HRT  . APPENDECTOMY    . MODIFIED RADICAL MASTECTOMY W/ AXILLARY LYMPH NODE DISSECTION W/ PECTORALIS MINOR PRESERVATION  2010   bilateral, for DCIS,  [name] , [facility]   Social History   Socioeconomic History  . Marital status: Married    Spouse name: None  . Number of children: None  . Years of education: None  . Highest education level: None  Social Needs  . Financial resource strain: None  . Food insecurity - worry: None  . Food insecurity - inability: None  . Transportation needs - medical: None  . Transportation needs - non-medical: None  Occupational History  . None  Tobacco Use  . Smoking status: Former Smoker    Last attempt to quit: 10/23/1971    Years since quitting: 46.0  . Smokeless  tobacco: Never Used  Substance and Sexual Activity  . Alcohol use: Yes    Alcohol/week: 4.2 oz    Types: 7 Glasses of wine per week    Comment: occasional  . Drug use: No  . Sexual activity: Not Currently  Other Topics Concern  . None  Social History Narrative  . None   No Known Allergies Family History  Problem Relation Age of Onset  . Cancer Mother 27       esophgeal CA mets to lungs, spine   . Stroke Father 90       tobacco abuse, hyperlipidemia  . Hyperlipidemia Father   . Heart disease Father   . Diabetes Maternal Grandfather      Past medical history, social, surgical and family history all reviewed in electronic medical record.  No pertanent information unless stated regarding to the chief complaint.   Review of Systems:Review of systems updated and as accurate as of 11/07/17  No headache, visual changes, nausea, vomiting, diarrhea, constipation, dizziness, abdominal pain, skin rash, fevers, chills, night sweats, weight loss, swollen lymph nodes, body aches, joint swelling, , chest pain, shortness of breath, mood changes.  Positive muscle aches  Objective  Blood pressure 104/60, pulse 81, height 5\' 3"  (1.6 m), weight 167 lb (75.8 kg), SpO2 94 %.  Systems examined below as of 11/07/17   General: No apparent distress alert and oriented x3 mood and affect normal, dressed appropriately.  HEENT: Pupils equal, extraocular movements intact  Respiratory: Patient's speak in full sentences and does not appear short of breath  Cardiovascular: No lower extremity edema, non tender, no erythema  Skin: Warm dry intact with no signs of infection or rash on extremities or on axial skeleton.  Abdomen: Soft nontender  Neuro: Cranial nerves II through XII are intact, neurovascularly intact in all extremities with 2+ DTRs and 2+ pulses.  Lymph: No lymphadenopathy of posterior or anterior cervical chain or axillae bilaterally.  Gait normal with good balance and coordination.  MSK:  Non  tender with full range of motion and good stability and symmetric strength and tone of shoulders, elbows, wrist, hip, knee and ankles bilaterally.  Back Exam:  Inspection: Loss of lordosis with degenerative scoliosis Motion: Flexion 40 deg, Extension 35 deg, Side Bending to 35 deg bilaterally,  Rotation to 35 deg bilaterally  SLR laying: Tighter on the right with mild radicular symptoms XSLR laying: Negative  Palpable tenderness: Tender to palpation in the paraspinal musculature lumbar spine right greater than left. FABER: negative. Sensory change: Gross sensation intact to all lumbar and sacral dermatomes.  Reflexes: 2+ at both patellar tendons, 2+ at achilles tendons, Babinski's downgoing.  Strength at foot  Plantar-flexion: 5/5 Dorsi-flexion: 5/5 Eversion: 5/5 Inversion: 5/5  Leg strength 4 out of 5 but symmetric       Impression and Recommendations:     This case required medical decision making of moderate complexity.      Note: This dictation was prepared with Dragon dictation along with smaller phrase technology. Any transcriptional errors that result from this process are unintentional.

## 2017-11-07 NOTE — Assessment & Plan Note (Signed)
I believe the patient is having some neurogenic claudication.  Encourage patient to increase gabapentin to 300 mg.  Patient will try Medrol.  Has muscle relaxers for breakthrough pain.  Given prednisone for her trip in case there is an exacerbation.  Discussed topical anti-inflammatories as well.  Discussed icing regimen.  Home exercises encouraged.  Follow-up after the trip.  Could be a candidate for epidural steroid injections.

## 2017-11-11 DIAGNOSIS — G43109 Migraine with aura, not intractable, without status migrainosus: Secondary | ICD-10-CM | POA: Diagnosis not present

## 2017-12-02 DIAGNOSIS — G43109 Migraine with aura, not intractable, without status migrainosus: Secondary | ICD-10-CM | POA: Diagnosis not present

## 2017-12-02 DIAGNOSIS — H353131 Nonexudative age-related macular degeneration, bilateral, early dry stage: Secondary | ICD-10-CM | POA: Diagnosis not present

## 2017-12-03 ENCOUNTER — Encounter: Payer: Self-pay | Admitting: Family Medicine

## 2017-12-03 DIAGNOSIS — M5416 Radiculopathy, lumbar region: Secondary | ICD-10-CM

## 2017-12-15 ENCOUNTER — Ambulatory Visit
Admission: RE | Admit: 2017-12-15 | Discharge: 2017-12-15 | Disposition: A | Discharge status: Home / Self Care | Payer: Medicare HMO | Source: Ambulatory Visit | Attending: Family Medicine | Admitting: Family Medicine

## 2017-12-15 DIAGNOSIS — M5416 Radiculopathy, lumbar region: Secondary | ICD-10-CM

## 2017-12-15 DIAGNOSIS — M5126 Other intervertebral disc displacement, lumbar region: Secondary | ICD-10-CM | POA: Diagnosis not present

## 2017-12-15 MED ORDER — IOPAMIDOL (ISOVUE-M 200) INJECTION 41%
1.0000 mL | Freq: Once | INTRAMUSCULAR | Status: AC
  Administered 2017-12-15: 1 mL via EPIDURAL

## 2017-12-15 MED ORDER — METHYLPREDNISOLONE ACETATE 40 MG/ML INJ SUSP (RADIOLOG
120.0000 mg | Freq: Once | INTRAMUSCULAR | Status: AC
  Administered 2017-12-15: 120 mg via EPIDURAL

## 2017-12-15 NOTE — Discharge Instructions (Signed)

## 2018-01-03 ENCOUNTER — Encounter: Payer: Self-pay | Admitting: Internal Medicine

## 2018-01-09 ENCOUNTER — Encounter: Payer: Self-pay | Admitting: Internal Medicine

## 2018-02-09 ENCOUNTER — Other Ambulatory Visit: Payer: Self-pay | Admitting: Internal Medicine

## 2018-03-09 DIAGNOSIS — L570 Actinic keratosis: Secondary | ICD-10-CM | POA: Diagnosis not present

## 2018-03-09 DIAGNOSIS — L820 Inflamed seborrheic keratosis: Secondary | ICD-10-CM | POA: Diagnosis not present

## 2018-03-09 DIAGNOSIS — L538 Other specified erythematous conditions: Secondary | ICD-10-CM | POA: Diagnosis not present

## 2018-03-09 DIAGNOSIS — X32XXXA Exposure to sunlight, initial encounter: Secondary | ICD-10-CM | POA: Diagnosis not present

## 2018-03-09 DIAGNOSIS — R208 Other disturbances of skin sensation: Secondary | ICD-10-CM | POA: Diagnosis not present

## 2018-03-19 ENCOUNTER — Encounter: Payer: Self-pay | Admitting: Internal Medicine

## 2018-03-19 ENCOUNTER — Ambulatory Visit (INDEPENDENT_AMBULATORY_CARE_PROVIDER_SITE_OTHER): Payer: Medicare HMO | Admitting: Internal Medicine

## 2018-03-19 ENCOUNTER — Telehealth: Payer: Self-pay

## 2018-03-19 VITALS — BP 108/70 | HR 76 | Temp 98.2°F | Resp 14 | Ht 63.0 in | Wt 159.2 lb

## 2018-03-19 DIAGNOSIS — Z23 Encounter for immunization: Secondary | ICD-10-CM

## 2018-03-19 DIAGNOSIS — E785 Hyperlipidemia, unspecified: Secondary | ICD-10-CM

## 2018-03-19 DIAGNOSIS — Z0000 Encounter for general adult medical examination without abnormal findings: Secondary | ICD-10-CM

## 2018-03-19 DIAGNOSIS — M48062 Spinal stenosis, lumbar region with neurogenic claudication: Secondary | ICD-10-CM | POA: Diagnosis not present

## 2018-03-19 DIAGNOSIS — E559 Vitamin D deficiency, unspecified: Secondary | ICD-10-CM

## 2018-03-19 DIAGNOSIS — R5383 Other fatigue: Secondary | ICD-10-CM

## 2018-03-19 MED ORDER — GABAPENTIN 100 MG PO CAPS: ORAL_CAPSULE | ORAL | 11 refills | Status: DC

## 2018-03-19 MED ORDER — PREDNISONE 50 MG PO TABS: 50.0000 mg | ORAL_TABLET | Freq: Every day | ORAL | 0 refills | Status: DC

## 2018-03-19 MED ORDER — TRAMADOL HCL 50 MG PO TABS: 50.0000 mg | ORAL_TABLET | Freq: Three times a day (TID) | ORAL | 0 refills | Status: DC | PRN

## 2018-03-19 NOTE — Progress Notes (Signed)
Patient ID: Tammy Tate, female    DOB: November 20, 1940  Age: 77 y.o. MRN: [id number]  The patient is here for annual Medicare wellness examination and management of other chronic and acute problems.   The risk factors are reflected in the social history.  The roster of all physicians providing medical care to patient - is listed in the Snapshot section of the chart.  Activities of daily living:  The patient is 100% independent in all ADLs: dressing, toileting, feeding as well as independent mobility  Home safety : The patient has smoke detectors in the home. They wear seatbelts.  There are no firearms at home. There is no violence in the home.   There is no risks for hepatitis, STDs or HIV. There is no   history of blood transfusion. They have no travel history to infectious disease endemic areas of the world.  The patient has seen their dentist in the last six month. They have seen their eye doctor in the last year. They admit to slight hearing difficulty with regard to whispered voices and some television programs.  They have deferred audiologic testing in the last year.  They do not  have excessive sun exposure. Discussed the need for sun protection: hats, long sleeves and use of sunscreen if there is significant sun exposure.   Diet: the importance of a healthy diet is discussed. They do have a healthy diet.  The benefits of regular aerobic exercise were discussed. She walks 4 times per week ,  20 minutes.   Depression screen: there are no signs or vegative symptoms of depression- irritability, change in appetite, anhedonia, sadness/tearfullness.  Cognitive assessment: the patient manages all their financial and personal affairs and is actively engaged. They could relate day,date,year and events; recalled 2/3 objects at 3 minutes; performed clock-face test normally.  The following portions of the patient's history were reviewed and updated as appropriate: allergies, current medications,  past family history, past medical history,  past surgical history, past social history  and problem list.  Visual acuity was not assessed per patient preference since she has regular follow up with her ophthalmologist. Hearing and body mass index were assessed and reviewed.   During the course of the visit the patient was educated and counseled about appropriate screening and preventive services including : fall prevention , diabetes screening, nutrition counseling, colorectal cancer screening, and recommended immunizations.    CC: The primary encounter diagnosis was Vitamin D deficiency. Diagnoses of Hyperlipidemia LDL goal <130, Fatigue, unspecified type, Need for 23-polyvalent pneumococcal polysaccharide vaccine, Spinal stenosis of lumbar region with neurogenic claudication, and Visit for preventive health examination were also pertinent to this visit.   bilateral iliac crest  Pain without radiculopathy still present.  Low grade Spinal stenosis without nerve root compression: She  received  1 ESI by  [employer]  In March .  Had transient relief as soon as the lidocaine wore off  .  Has not followed up with orthopedics.  using aleve   History Irving has a past medical history of Allergy, B12 deficiency, Bronchitis, Cancer ([place]) (2010), Chicken pox, Degenerative arthritis of lumbar spine, Degenerative disk disease, GERD (gastroesophageal reflux disease), Migraine with aura, Thyroid disease, and Vasomotor rhinitis.   She has a past surgical history that includes Appendectomy; Modified radical mastectomy w/ axillary lymph node dissection w/ pectoral minor preservation (2010); and Abdominal hysterectomy.   Her family history includes Cancer (age of onset: 31) in her mother; Diabetes in her maternal grandfather; Heart  disease in her father; Hyperlipidemia in her father; Stroke (age of onset: 40) in her father.She reports that she quit smoking about 46 years ago. She has never used smokeless  tobacco. She reports that she drinks about 4.2 oz of alcohol per week. She reports that she does not use drugs.  Outpatient Medications Prior to Visit  Medication Sig Dispense Refill  . Biotin (SUPER BIOTIN) 5 MG TABS Take by mouth.    . Cholecalciferol (VITAMIN D) 2000 UNITS tablet Take 2,000 Units by mouth daily.    Marland Kitchen levothyroxine (SYNTHROID, LEVOTHROID) 88 MCG tablet TAKE ONE TABLET BY MOUTH EVERY DAY 90 tablet 0  . Omega-3 Fatty Acids (OMEGA 3 PO) Take by mouth daily.    . tizanidine (ZANAFLEX) 2 MG capsule Take 1 capsule (2 mg total) by mouth 3 (three) times daily as needed for muscle spasms. 30 capsule 3  . vitamin B-12 (CYANOCOBALAMIN) 1000 MCG tablet Take 1,000 mcg by mouth daily.    Marland Kitchen zolpidem (AMBIEN) 10 MG tablet Take 1 tablet (10 mg total) by mouth at bedtime as needed. 30 tablet 5  . gabapentin (NEURONTIN) 100 MG capsule Take 2 capsules (200 mg total) by mouth at bedtime. 180 capsule 3  . famotidine (PEPCID) 20 MG tablet Take 1 tablet (20 mg total) by mouth 2 (two) times daily. (Patient not taking: Reported on 11/07/2017) 60 tablet 1  . Multiple Vitamin (MULTIVITAMIN) tablet Take 1 tablet by mouth daily.    . predniSONE (DELTASONE) 50 MG tablet Take 1 tablet (50 mg total) by mouth daily. (Patient not taking: Reported on 03/19/2018) 7 tablet 0  . vitamin E (VITAMIN E) 400 UNIT capsule Take 400 Units by mouth daily.     No facility-administered medications prior to visit.     Review of Systems   Patient denies headache, fevers, malaise, unintentional weight loss, skin rash, eye pain, sinus congestion and sinus pain, sore throat, dysphagia,  hemoptysis , cough, dyspnea, wheezing, chest pain, palpitations, orthopnea, edema, abdominal pain, nausea, melena, diarrhea, constipation, flank pain, dysuria, hematuria, urinary  Frequency, nocturia, numbness, tingling, seizures,  Focal weakness, Loss of consciousness,  Tremor, insomnia, depression, anxiety, and suicidal ideation.       Objective:  BP 108/70 (BP Location: Left Arm, Patient Position: Sitting, Cuff Size: Normal)   Pulse 76   Temp 98.2 F (36.8 C) (Oral)   Resp 14   Ht 5\' 3"  (1.6 m)   Wt 159 lb 3.2 oz (72.2 kg)   SpO2 96%   BMI 28.20 kg/m   Physical Exam   General appearance: alert, cooperative and appears stated age Head: Normocephalic, without obvious abnormality, atraumatic Eyes: conjunctivae/corneas clear. PERRL, EOM's intact. Fundi benign. Ears: normal TM's and external ear canals both ears Nose: Nares normal. Septum midline. Mucosa normal. No drainage or sinus tenderness. Throat: lips, mucosa, and tongue normal; teeth and gums normal Neck: no adenopathy, no carotid bruit, no JVD, supple, symmetrical, trachea midline and thyroid not enlarged, symmetric, no tenderness/mass/nodules Lungs: clear to auscultation bilaterally Breasts:  bilateral mastectomy with saline implants. normal appearance, no masses or tenderness Heart: regular rate and rhythm, S1, S2 normal, no murmur, click, rub or gallop Abdomen: soft, non-tender; bowel sounds normal; no masses,  no organomegaly Extremities: extremities normal, atraumatic, no cyanosis or edema Pulses: 2+ and symmetric Skin: Skin color, texture, turgor normal. No rashes or lesions Neurologic: Alert and oriented X 3, normal strength and tone. Normal symmetric reflexes. Normal coordination and gait.      Assessment &  Plan:   Problem List Items Addressed This Visit    Visit for preventive health examination    Annual comprehensive preventive exam was done as well as an evaluation and management of chronic conditions .  During the course of the visit the patient was educated and counseled about appropriate screening and preventive services including :  diabetes screening, lipid analysis with projected  10 year  risk for CAD , nutrition counseling, breast, cervical and colorectal cancer screening, and recommended immunizations.  Printed recommendations  for health maintenance screenings was given      Lumbar spinal stenosis    Affecting lumbar spine , small joints of both hands,  Continue Aleve up to bid. Prn ,  increasing dose of gabapentin to tid.    Prednisone taperand tramadol rx wriitten to take with her on trip out [place]       Other Visit Diagnoses    Vitamin D deficiency    -  Primary   Relevant Orders   VITAMIN D 25 Hydroxy (Vit-D Deficiency, Fractures)   Hyperlipidemia LDL goal <130       Relevant Orders   Lipid panel   Fatigue, unspecified type       Relevant Orders   Comprehensive metabolic panel   TSH   CBC with Differential/Platelet   Need for 23-polyvalent pneumococcal polysaccharide vaccine       Relevant Orders   Pneumococcal polysaccharide vaccine 23-valent greater than or equal to 2yo subcutaneous/IM (Completed)      I have discontinued Jorden S. Mcwright "Raye"'s multivitamin, vitamin E, and famotidine. I have also changed her gabapentin. Additionally, I am having her start on traMADol. Lastly, I am having her maintain her Omega-3 Fatty Acids (OMEGA 3 PO), vitamin B-12, Vitamin D, Biotin, tizanidine, zolpidem, levothyroxine, and predniSONE.  Meds ordered this encounter  Medications  . predniSONE (DELTASONE) 50 MG tablet    Sig: Take 1 tablet (50 mg total) by mouth daily.    Dispense:  7 tablet    Refill:  0  . gabapentin (NEURONTIN) 100 MG capsule    Sig: 1 tablet at breakfast, lunch and dinner  3 at bedtime    Dispense:  180 capsule    Refill:  11  . traMADol (ULTRAM) 50 MG tablet    Sig: Take 1 tablet (50 mg total) by mouth every 8 (eight) hours as needed.    Dispense:  30 tablet    Refill:  0    Medications Discontinued During This Encounter  Medication Reason  . famotidine (PEPCID) 20 MG tablet Patient has not taken in last 30 days  . Multiple Vitamin (MULTIVITAMIN) tablet Patient has not taken in last 30 days  . predniSONE (DELTASONE) 50 MG tablet Completed Course  . vitamin E (VITAMIN E) 400  UNIT capsule Patient has not taken in last 30 days  . gabapentin (NEURONTIN) 100 MG capsule     Follow-up: Return in about 3 months (around 06/19/2018).   Crecencio Mc, MD

## 2018-03-19 NOTE — Telephone Encounter (Signed)
PA for Gabapentin has been submitted on covermymeds.

## 2018-03-19 NOTE — Patient Instructions (Addendum)
increase gabapentin to 300 mg at bedtime.   You can add 100 mg in morning with breakfast,  Lunch and dinner as well if it helps.   Follow up with [facility]  Since the first injection  did not help    To prevent infections from [place] with sick people:   You should flush your sinuses with  [employer]'s Sinus rinse ;  It is a stgong sinus "flush" using water and medicated salts.  Do it over the sink because it can be a bit messy  You received your last Pneumonia vaccine today   I am sending yo off with Prednisone and tramadol to use if your back becomes more painful during your trip.    If you start the prednisone, suspend the aleve for a week.  You can continue tylenol and tramadol at the same time.

## 2018-03-20 DIAGNOSIS — M7550 Bursitis of unspecified shoulder: Secondary | ICD-10-CM

## 2018-03-20 DIAGNOSIS — M1990 Unspecified osteoarthritis, unspecified site: Secondary | ICD-10-CM

## 2018-03-20 DIAGNOSIS — M5136 Other intervertebral disc degeneration, lumbar region: Secondary | ICD-10-CM

## 2018-03-20 NOTE — Telephone Encounter (Signed)
PA for Gabapentin has been approved from 09/30/2017-09/29/2018. Pt and pharmacy are aware.

## 2018-03-21 NOTE — Assessment & Plan Note (Signed)
Affecting lumbar spine , small joints of both hands,  Continue Aleve up to bid. Prn ,  increasing dose of gabapentin to tid.    Prednisone taperand tramadol rx wriitten to take with her on trip out [place]

## 2018-03-21 NOTE — Assessment & Plan Note (Addendum)
Annual comprehensive preventive exam was done as well as an evaluation and management of chronic conditions .  During the course of the visit the patient was educated and counseled about appropriate screening and preventive services including :  diabetes screening, lipid analysis with projected  10 year  risk for CAD , nutrition counseling, breast, cervical and colorectal cancer screening, and recommended immunizations.  Printed recommendations for health maintenance screenings was given 

## 2018-03-23 ENCOUNTER — Other Ambulatory Visit (INDEPENDENT_AMBULATORY_CARE_PROVIDER_SITE_OTHER): Payer: Medicare HMO

## 2018-03-23 DIAGNOSIS — R5383 Other fatigue: Secondary | ICD-10-CM

## 2018-03-23 DIAGNOSIS — E785 Hyperlipidemia, unspecified: Secondary | ICD-10-CM | POA: Diagnosis not present

## 2018-03-23 DIAGNOSIS — E559 Vitamin D deficiency, unspecified: Secondary | ICD-10-CM | POA: Diagnosis not present

## 2018-03-23 LAB — CBC WITH DIFFERENTIAL/PLATELET
BASOS PCT: 0.7 % (ref 0.0–3.0)
Basophils Absolute: 0 10*3/uL (ref 0.0–0.1)
EOS ABS: 0.3 10*3/uL (ref 0.0–0.7)
Eosinophils Relative: 5.4 % — ABNORMAL HIGH (ref 0.0–5.0)
HEMATOCRIT: 39 % (ref 36.0–46.0)
Hemoglobin: 13.7 g/dL (ref 12.0–15.0)
LYMPHS ABS: 1 10*3/uL (ref 0.7–4.0)
LYMPHS PCT: 17.1 % (ref 12.0–46.0)
MCHC: 35.2 g/dL (ref 30.0–36.0)
MCV: 100.2 fl — AB (ref 78.0–100.0)
MONOS PCT: 12.3 % — AB (ref 3.0–12.0)
Monocytes Absolute: 0.7 10*3/uL (ref 0.1–1.0)
NEUTROS ABS: 3.8 10*3/uL (ref 1.4–7.7)
NEUTROS PCT: 64.5 % (ref 43.0–77.0)
Platelets: 275 10*3/uL (ref 150.0–400.0)
RBC: 3.89 Mil/uL (ref 3.87–5.11)
RDW: 11.9 % (ref 11.5–15.5)
WBC: 5.9 10*3/uL (ref 4.0–10.5)

## 2018-03-23 LAB — COMPREHENSIVE METABOLIC PANEL
ALT: 12 U/L (ref 0–35)
AST: 16 U/L (ref 0–37)
Albumin: 4 g/dL (ref 3.5–5.2)
Alkaline Phosphatase: 71 U/L (ref 39–117)
BUN: 17 mg/dL (ref 6–23)
CHLORIDE: 106 meq/L (ref 96–112)
CO2: 27 mEq/L (ref 19–32)
Calcium: 9.3 mg/dL (ref 8.4–10.5)
Creatinine, Ser: 0.89 mg/dL (ref 0.40–1.20)
GFR: 65.46 mL/min (ref >60.00)
GLUCOSE: 90 mg/dL (ref 70–99)
Potassium: 4.2 mEq/L (ref 3.5–5.1)
Sodium: 141 mEq/L (ref 135–145)
Total Bilirubin: 0.6 mg/dL (ref 0.2–1.2)
Total Protein: 6.5 g/dL (ref 6.0–8.3)

## 2018-03-23 LAB — LIPID PANEL
Cholesterol: 201 mg/dL — ABNORMAL HIGH (ref 0–200)
HDL: 70.7 mg/dL (ref >39.00)
LDL CALC: 111 mg/dL — AB (ref 0–99)
NONHDL: 130.03
Total CHOL/HDL Ratio: 3
Triglycerides: 97 mg/dL (ref 0.0–149.0)
VLDL: 19.4 mg/dL (ref 0.0–40.0)

## 2018-03-23 LAB — VITAMIN D 25 HYDROXY (VIT D DEFICIENCY, FRACTURES): VITD: 43.36 ng/mL (ref 30.00–100.00)

## 2018-03-23 LAB — TSH: TSH: 3.22 u[IU]/mL (ref 0.35–4.50)

## 2018-06-25 ENCOUNTER — Ambulatory Visit: Payer: Medicare HMO | Admitting: Internal Medicine

## 2018-06-25 DIAGNOSIS — Z0289 Encounter for other administrative examinations: Secondary | ICD-10-CM

## 2018-07-03 ENCOUNTER — Other Ambulatory Visit: Payer: Self-pay | Admitting: Internal Medicine

## 2018-07-03 NOTE — Telephone Encounter (Signed)
Refilled: 11/04/2017 Last OV: 03/19/2018 Next OV: 07/15/2018

## 2018-07-15 ENCOUNTER — Ambulatory Visit (INDEPENDENT_AMBULATORY_CARE_PROVIDER_SITE_OTHER): Payer: Medicare HMO | Admitting: Internal Medicine

## 2018-07-15 ENCOUNTER — Encounter: Payer: Self-pay | Admitting: Internal Medicine

## 2018-07-15 DIAGNOSIS — F5104 Psychophysiologic insomnia: Secondary | ICD-10-CM | POA: Diagnosis not present

## 2018-07-15 DIAGNOSIS — R69 Illness, unspecified: Secondary | ICD-10-CM | POA: Diagnosis not present

## 2018-07-15 DIAGNOSIS — E034 Atrophy of thyroid (acquired): Secondary | ICD-10-CM | POA: Diagnosis not present

## 2018-07-15 DIAGNOSIS — Z23 Encounter for immunization: Secondary | ICD-10-CM | POA: Diagnosis not present

## 2018-07-15 DIAGNOSIS — K219 Gastro-esophageal reflux disease without esophagitis: Secondary | ICD-10-CM | POA: Diagnosis not present

## 2018-07-15 DIAGNOSIS — M48062 Spinal stenosis, lumbar region with neurogenic claudication: Secondary | ICD-10-CM

## 2018-07-15 MED ORDER — PREDNISONE 10 MG PO TABS: ORAL_TABLET | ORAL | 0 refills | Status: DC

## 2018-07-15 NOTE — Patient Instructions (Signed)
I am refilling the prednisone taper to keep on hand for the next bad back pain  Flare  You can use the aleve  Twice daily an-long with tramadol twice daily and zanaflax as needed for day to day management

## 2018-07-15 NOTE — Progress Notes (Signed)
Subjective:  Patient ID: Tammy Tate, female    DOB: 07-08-1941  Age: 77 y.o. MRN: [id number]  CC: Diagnoses of Encounter for immunization, Gastroesophageal reflux disease without esophagitis, Spinal stenosis of lumbar region with neurogenic claudication, Hypothyroidism due to acquired atrophy of thyroid, and Chronic insomnia were pertinent to this visit.  HPI Tammy Tate presents for follow up on multiple issues including chronic low back pain secondary to DDD  Lumbar spine,  Hypothyroidism,  And GERD   Last visit in June: Gabapentin increased.   Stopped 3 days ago due to worsening  fluid retention without clinical benefit. USING tramadol and zanaflex .  Not drowsy with either .  aleve prn.  Does not want surgery .  Pain is not severe, but she is having a difficult time emotionally  adjusting to her new baseline which makes her feel "old"   Screened for depression,,  Denies depressive symptoms ,  Acknowledges her relative fortunate state, financially secure.  Anxiety primarily triggered by family stressors relating to children and grandchildren's personal health issues.     Outpatient Medications Prior to Visit  Medication Sig Dispense Refill  . Biotin (SUPER BIOTIN) 5 MG TABS Take by mouth.    . Cholecalciferol (VITAMIN D) 2000 UNITS tablet Take 2,000 Units by mouth daily.    Marland Kitchen levothyroxine (SYNTHROID, LEVOTHROID) 88 MCG tablet TAKE ONE TABLET BY MOUTH EVERY DAY 90 tablet 0  . Omega-3 Fatty Acids (OMEGA 3 PO) Take by mouth daily.    . tizanidine (ZANAFLEX) 2 MG capsule Take 1 capsule (2 mg total) by mouth 3 (three) times daily as needed for muscle spasms. 30 capsule 3  . traMADol (ULTRAM) 50 MG tablet Take 1 tablet (50 mg total) by mouth every 8 (eight) hours as needed. 30 tablet 0  . vitamin B-12 (CYANOCOBALAMIN) 1000 MCG tablet Take 1,000 mcg by mouth daily.    Marland Kitchen zolpidem (AMBIEN) 10 MG tablet TAKE ONE TABLET BY MOUTH AT BEDTIME AS NEEDED 30 tablet 2  . gabapentin (NEURONTIN)  100 MG capsule 1 tablet at breakfast, lunch and dinner  3 at bedtime 180 capsule 11  . predniSONE (DELTASONE) 50 MG tablet Take 1 tablet (50 mg total) by mouth daily. 7 tablet 0   No facility-administered medications prior to visit.     Review of Systems;  Patient denies headache, fevers, malaise, unintentional weight loss, skin rash, eye pain, sinus congestion and sinus pain, sore throat, dysphagia,  hemoptysis , cough, dyspnea, wheezing, chest pain, palpitations, orthopnea, edema, abdominal pain, nausea, melena, diarrhea, constipation, flank pain, dysuria, hematuria, urinary  Frequency, nocturia, numbness, tingling, seizures,  Focal weakness, Loss of consciousness,  Tremor, insomnia, depression, anxiety, and suicidal ideation.      Objective:  BP 102/70 (BP Location: Left Arm, Patient Position: Sitting, Cuff Size: Normal)   Pulse 69   Temp 98.6 F (37 C) (Oral)   Resp 14   Ht 5\' 3"  (1.6 m)   Wt 163 lb 6.4 oz (74.1 kg)   SpO2 96%   BMI 28.95 kg/m   BP Readings from Last 3 Encounters:  07/16/18 110/60  07/15/18 102/70  03/19/18 108/70    Wt Readings from Last 3 Encounters:  07/16/18 164 lb 9.6 oz (74.7 kg)  07/15/18 163 lb 6.4 oz (74.1 kg)  03/19/18 159 lb 3.2 oz (72.2 kg)    General appearance: alert, cooperative and appears stated age Ears: normal TM's and external ear canals both ears Throat: lips, mucosa, and tongue normal; teeth  and gums normal Neck: no adenopathy, no carotid bruit, supple, symmetrical, trachea midline and thyroid not enlarged, symmetric, no tenderness/mass/nodules Back: symmetric, no curvature. ROM normal. No CVA tenderness. Lungs: clear to auscultation bilaterally Heart: regular rate and rhythm, S1, S2 normal, no murmur, click, rub or gallop Abdomen: soft, non-tender; bowel sounds normal; no masses,  no organomegaly Pulses: 2+ and symmetric Skin: Skin color, texture, turgor normal. No rashes or lesions Lymph nodes: Cervical, supraclavicular, and  axillary nodes normal.  Lab Results  Component Value Date   HGBA1C 5.1 06/17/2016    Lab Results  Component Value Date   CREATININE 0.89 03/23/2018   CREATININE 0.92 03/13/2017   CREATININE 0.88 03/11/2016    Lab Results  Component Value Date   WBC 5.9 03/23/2018   HGB 13.7 03/23/2018   HCT 39.0 03/23/2018   PLT 275.0 03/23/2018   GLUCOSE 90 03/23/2018   CHOL 201 (H) 03/23/2018   TRIG 97.0 03/23/2018   HDL 70.70 03/23/2018   LDLDIRECT 127.3 07/05/2013   LDLCALC 111 (H) 03/23/2018   ALT 12 03/23/2018   AST 16 03/23/2018   NA 141 03/23/2018   K 4.2 03/23/2018   CL 106 03/23/2018   CREATININE 0.89 03/23/2018   BUN 17 03/23/2018   CO2 27 03/23/2018   TSH 3.22 03/23/2018   HGBA1C 5.1 06/17/2016   MICROALBUR 1.3 06/17/2016    Dg Inject Diag/thera/inc Needle/cath/plc Epi/lumb/sac W/img  Result Date: 12/15/2017 CLINICAL DATA:  Low back pain. Displacement of the lumbar disc at multiple levels. Pain is worse left than right. Limited radicular symptoms. FLUOROSCOPY TIME:  Radiation Exposure Index (as provided by the fluoroscopic device): 11.15 uGy*m2 Fluoroscopy Time:  14 seconds Number of Acquired Images:  0 PROCEDURE: The procedure, risks, benefits, and alternatives were explained to the patient. Questions regarding the procedure were encouraged and answered. The patient understands and consents to the procedure. LUMBAR EPIDURAL INJECTION: An interlaminar approach was performed on left at L4-5. The overlying skin was cleansed and anesthetized. A 20 gauge epidural needle was advanced using loss-of-resistance technique. DIAGNOSTIC EPIDURAL INJECTION: Injection of Isovue-M 200 shows a good epidural pattern with spread above and below the level of needle placement, primarily on the left no vascular opacification is seen. THERAPEUTIC EPIDURAL INJECTION: 120 mg of Depo-Medrol mixed with 3 mL 1% lidocaine were instilled. The procedure was well-tolerated, and the patient was discharged thirty  minutes following the injection in good condition. COMPLICATIONS: None IMPRESSION: Technically successful epidural injection on the left L4-5 # 1 Electronically Signed   By: San Morelle M.D.   On: 12/15/2017 09:30    Assessment & Plan:   Problem List Items Addressed This Visit    Acid reflux    Patient is no longer taking a daily PPI       Chronic insomnia    Managed with prn ambien. The risks and benefits of hypnotic use by the elderly were reviewedwith patient today including excessive sedation , confusion, ,  impaired thinking/driving, and addiction.  Patient was advised to avoid concurrent use with alcohol, to use medication only as needed and not to share with others  .       Hypothyroidism    Thyroid function is WNL on current dose.  No current changes needed.   Lab Results  Component Value Date   TSH 3.22 03/23/2018         Lumbar spinal stenosis     did not tolerate increased dose of  gabapentin to tid.  Prednisone taper given for next recurrence of severe pain.  Refilling  tramadol rx  For twice daily use.        Other Visit Diagnoses    Encounter for immunization       Relevant Orders   Flu vaccine HIGH DOSE PF (Completed)    A total of 25 minutes of face to face time was spent with patient more than half of which was spent in counselling about the above mentioned conditions  and coordination of care  I have discontinued Shakendra S. Dodds "Raye"'s predniSONE. I am also having her start on predniSONE. Additionally, I am having her maintain her Omega-3 Fatty Acids (OMEGA 3 PO), vitamin B-12, Vitamin D, Biotin, tizanidine, levothyroxine, gabapentin, traMADol, and zolpidem.  Meds ordered this encounter  Medications  . predniSONE (DELTASONE) 10 MG tablet    Sig: 6 tablets on Day 1 , then reduce by 1 tablet daily until gone    Dispense:  21 tablet    Refill:  0    Medications Discontinued During This Encounter  Medication Reason  . predniSONE (DELTASONE)  50 MG tablet Completed Course    Follow-up: No follow-ups on file.   Crecencio Mc, MD

## 2018-07-16 ENCOUNTER — Encounter: Payer: Self-pay | Admitting: Family Medicine

## 2018-07-16 ENCOUNTER — Ambulatory Visit: Payer: Self-pay

## 2018-07-16 ENCOUNTER — Ambulatory Visit (INDEPENDENT_AMBULATORY_CARE_PROVIDER_SITE_OTHER): Payer: Medicare HMO | Admitting: Family Medicine

## 2018-07-16 VITALS — BP 110/60 | HR 74 | Temp 99.0°F | Ht 63.0 in | Wt 164.6 lb

## 2018-07-16 DIAGNOSIS — R197 Diarrhea, unspecified: Secondary | ICD-10-CM | POA: Diagnosis not present

## 2018-07-16 DIAGNOSIS — B349 Viral infection, unspecified: Secondary | ICD-10-CM | POA: Diagnosis not present

## 2018-07-16 DIAGNOSIS — F5104 Psychophysiologic insomnia: Secondary | ICD-10-CM | POA: Insufficient documentation

## 2018-07-16 DIAGNOSIS — R52 Pain, unspecified: Secondary | ICD-10-CM

## 2018-07-16 DIAGNOSIS — R509 Fever, unspecified: Secondary | ICD-10-CM | POA: Diagnosis not present

## 2018-07-16 MED ORDER — LOPERAMIDE HCL 2 MG PO TABS: 2.0000 mg | ORAL_TABLET | Freq: Four times a day (QID) | ORAL | 0 refills | Status: DC | PRN

## 2018-07-16 NOTE — Telephone Encounter (Signed)
Pt. Reports she had her flu shot yesterday and during the night had "shaking chills x 1 hour." No fever. Has body aches and joint pain. States she feels she had a reaction to the flu vaccine. No local reaction at injection site. Triage spoke with Christan - FC . Pt. Will treat symptoms.  Answer Assessment - Initial Assessment Questions 1. SYMPTOMS: "What is the main symptom?" (e.g., redness, swelling, pain)      Body aches and joint pain 2. ONSET: "When was the vaccine (shot) given?" "How much later did the Flu__ begin?" (e.g., hours, days ago)      Yesterday 3. SEVERITY: "How bad is it?"      Moderate 4. FEVER: "Is there a fever?" If so, ask: "What is it, how was it measured, and when did it start?"      No 5. IMMUNIZATIONS GIVEN: "What shots have you recently received?"     Flu 6. PAST REACTIONS: "Have you reacted to immunizations before?" If so, ask: "What happened?"     No 7. OTHER SYMPTOMS: "Do you have any other symptoms?"     Diarrhea, " shaking chills x 1 hour" last night.  Protocols used: IMMUNIZATION REACTIONS-A-AH

## 2018-07-16 NOTE — Assessment & Plan Note (Signed)
Thyroid function is WNL on current dose.  No current changes needed.   Lab Results  Component Value Date   TSH 3.22 03/23/2018

## 2018-07-16 NOTE — Patient Instructions (Signed)

## 2018-07-16 NOTE — Assessment & Plan Note (Signed)
did not tolerate increased dose of  gabapentin to tid.    Prednisone taper given for next recurrence of severe pain.  Refilling  tramadol rx  For twice daily use.

## 2018-07-16 NOTE — Assessment & Plan Note (Signed)
Managed with prn ambien. The risks and benefits of hypnotic use by the elderly were reviewedwith patient today including excessive sedation , confusion, ,  impaired thinking/driving, and addiction.  Patient was advised to avoid concurrent use with alcohol, to use medication only as needed and not to share with others  .  °

## 2018-07-16 NOTE — Telephone Encounter (Signed)
FYI Patient had flu shot yesterday , last night , developed body aches, shaking X 1 hour, diarrhea, .   No fever . Scheduled appointment to see Ander Purpura, NP today.

## 2018-07-16 NOTE — Progress Notes (Signed)
Subjective:    Patient ID: Tammy Tate, female    DOB: 1941-04-27, 77 y.o.   MRN: [id number]  HPI   Patient presents to clinic c/o chills, body aches, diarrhea, sneezing for 1 day. States the symptoms began a few hours after getting her flu vaccine yesterday. States she has had 5 loose stools from yesterday to today. States she ate eggs and bacon for breakfast and had chicken noodle soup for lunch. States since eating the soup she has not felt need to run to bathroom. Still feels somewhat achy, but this is also better.   Patient Active Problem List   Diagnosis Date Noted  . Lumbar spinal stenosis 11/07/2017  . Degenerative lumbar disc 04/04/2016  . Idiopathic scoliosis 04/04/2016  . Visit for preventive health examination 03/11/2016  . Bloodgood disease 12/26/2015  . Acid reflux 12/26/2015  . HLD (hyperlipidemia) 12/26/2015  . Increased MCV 12/26/2015  . Acute onset aura migraine 12/26/2015  . Arthritis, degenerative 12/26/2015  . Shingles rash 11/21/2015  . Ingrown toenail 11/21/2015  . Polyarthritis of hand 11/21/2015  . Macrocytosis without anemia 03/05/2014  . S/P breast reconstruction, bilateral 03/05/2014  . Overweight (BMI 25.0-29.9) 03/05/2014  . Subacromial bursitis 07/06/2013  . S/P bilateral mastectomy 04/07/2013  . Esophagitis, reflux 02/22/2012  . DCIS (ductal carcinoma in situ) of breast, right   . Encounter for Medicare annual wellness exam 02/19/2012  . Allergic rhinitis 10/24/2011  . Screening for osteoporosis 10/23/2011  . Screening for colon cancer 10/23/2011  . Hypothyroidism 10/23/2011  . Lumbago 10/23/2011  . B12 deficiency    Social History   Tobacco Use  . Smoking status: Former Smoker    Last attempt to quit: 10/23/1971    Years since quitting: 46.7  . Smokeless tobacco: Never Used  Substance Use Topics  . Alcohol use: Yes    Alcohol/week: 7.0 standard drinks    Types: 7 Glasses of wine per week    Comment: occasional   Review of  Systems  Constitutional: +aches, fever, chills.  HENT: +nasal congestion, sneezing   Eyes: Negative.   Respiratory: +cough. Negative for shortness of breath and wheezing.   Cardiovascular: Negative for chest pain, palpitations and leg swelling.  Gastrointestinal: Negative for abdominal pain, diarrhea, nausea and vomiting.  Genitourinary: Negative for dysuria, frequency and urgency.  Musculoskeletal: Generalized body aches.  Skin: Negative for color change, pallor and rash.  Neurological: Negative for syncope, light-headedness and headaches.  Psychiatric/Behavioral: The patient is not nervous/anxious.       Objective:   Physical Exam  Constitutional: She is oriented to person, place, and time. She appears well-nourished. No distress.  HENT:  Head: Normocephalic and atraumatic.  Right Ear: Tympanic membrane, external ear and ear canal normal.  Left Ear: Tympanic membrane, external ear and ear canal normal.  Nose: Nose normal.  Mouth/Throat: Oropharynx is clear and moist.  Eyes: Conjunctivae and EOM are normal. No scleral icterus.  Neck: Neck supple. No tracheal deviation present.  Cardiovascular: Normal rate, regular rhythm and normal heart sounds.  Pulmonary/Chest: Effort normal and breath sounds normal. No respiratory distress.  Abdominal: Soft. Bowel sounds are normal. She exhibits no distension and no mass. There is no tenderness. There is no rebound and no guarding.  Musculoskeletal: Normal range of motion. She exhibits no edema.  Lymphadenopathy:    She has no cervical adenopathy.  Neurological: She is alert and oriented to person, place, and time.  Skin: Skin is warm and dry. No rash noted. She is  not diaphoretic. No pallor.  Psychiatric: She has a normal mood and affect. Her behavior is normal.  Nursing note and vitals reviewed.     Vitals:   07/16/18 1354  BP: 110/60  Pulse: 74  Temp: 99 F (37.2 C)  SpO2: 97%    Assessment & Plan:    A total of 25  minutes  were spent face-to-face with the patient during this encounter and over half of that time was spent on counseling and coordination of care. The patient was counseled on bland diet, fever treatment, why it is important to get flu vaccine.   Diarrhea, generalized body aches, fever chills, viral illness - patient's symptoms seem to be improving still has gone on.  Advised she can take Imodium as needed to improve diarrhea symptoms.  Advised she can alternate Motrin and Tylenol to help calm down body aches.  Currently she has no fever at this time.  Advised to follow a bland diet with clear liquids for the next 1 to 2 days and then slowly advance diet as tolerated.  Patient is concerned that all of her symptoms use are from getting the flu shot, encouraged to get flu shot again next year as is the best tool to protect self from actually getting influenza.   Rest, increase fluids, do good handwashing.  Offered to do some blood work including CBC electrolyte panel, but patient declines at this time  Keep regularly scheduled follow-up as planned and return to clinic sooner if any issues arise.

## 2018-07-16 NOTE — Assessment & Plan Note (Addendum)
Patient is no longer taking a daily PPI

## 2018-07-17 LAB — POCT INFLUENZA A/B
INFLUENZA A, POC: NEGATIVE
INFLUENZA B, POC: NEGATIVE

## 2018-08-10 ENCOUNTER — Other Ambulatory Visit: Payer: Self-pay | Admitting: Internal Medicine

## 2018-08-12 ENCOUNTER — Ambulatory Visit (INDEPENDENT_AMBULATORY_CARE_PROVIDER_SITE_OTHER): Payer: Medicare HMO | Admitting: Family Medicine

## 2018-08-12 VITALS — BP 110/64 | HR 73 | Temp 98.1°F | Wt 164.0 lb

## 2018-08-12 DIAGNOSIS — J069 Acute upper respiratory infection, unspecified: Secondary | ICD-10-CM | POA: Insufficient documentation

## 2018-08-12 DIAGNOSIS — R42 Dizziness and giddiness: Secondary | ICD-10-CM | POA: Diagnosis not present

## 2018-08-12 NOTE — Assessment & Plan Note (Signed)
Symptoms consistent with viral URI.  Discussed supportive care with Flonase, Mucinex, and Zyrtec.  She will use Zyrtec-D for the next 1 to 2 days and then switch to regular Zyrtec.  She will stay hydrated.  We will check a BMP to evaluate sodium and hydration status given her lightheadedness.  She is given return precautions.

## 2018-08-12 NOTE — Progress Notes (Signed)
  Tommi Rumps, MD Phone: [phone]  Tammy Tate is a 77 y.o. female who presents today for same day.  CC: URI  Patient notes symptoms starting 4 days ago.  Started with sore throat and ear discomfort.  Developed sinus congestion and pressure.  Subsequently developed cough that is mildly productive.  She is blowing clear mucus out of her nose.  No shortness of breath.  No fevers.  She feels a little lightheaded and feels like her head is in a bucket of water.  She had some headache previously though that is improved.  Has sinus pressure now.  Her ear pain is improved.  She does have postnasal drip.  She is taking Zyrtec-D and Mucinex with good benefit.  She is trying to stay well-hydrated.  Social History   Tobacco Use  Smoking Status Former Smoker  . Last attempt to quit: 10/23/1971  . Years since quitting: 46.8  Smokeless Tobacco Never Used     ROS see history of present illness  Objective  Physical Exam Vitals:   08/12/18 1520  BP: 110/64  Pulse: 73  Temp: 98.1 F (36.7 C)  SpO2: 97%   Laying blood pressure 132/80 Sitting blood pressure 118/78 Standing blood pressure 110/80  BP Readings from Last 3 Encounters:  08/12/18 110/64  07/16/18 110/60  07/15/18 102/70   Wt Readings from Last 3 Encounters:  08/12/18 164 lb (74.4 kg)  07/16/18 164 lb 9.6 oz (74.7 kg)  07/15/18 163 lb 6.4 oz (74.1 kg)    Physical Exam  Constitutional: No distress.  HENT:  Head: Normocephalic and atraumatic.  Right Ear: Tympanic membrane and ear canal normal.  Left Ear: Tympanic membrane and ear canal normal.  Nose: Mucosal edema present.  Mouth/Throat: Oropharynx is clear and moist.  Eyes: Pupils are equal, round, and reactive to light. Conjunctivae are normal.  Neck: Neck supple.  Cardiovascular: Normal rate, regular rhythm and normal heart sounds.  Pulmonary/Chest: Effort normal and breath sounds normal.  Musculoskeletal: She exhibits no edema.  Lymphadenopathy:    She  has no cervical adenopathy.  Neurological: She is alert.  Skin: Skin is warm and dry. She is not diaphoretic.     Assessment/Plan: Please see individual problem list.  URI (upper respiratory infection) Symptoms consistent with viral URI.  Discussed supportive care with Flonase, Mucinex, and Zyrtec.  She will use Zyrtec-D for the next 1 to 2 days and then switch to regular Zyrtec.  She will stay hydrated.  We will check a BMP to evaluate sodium and hydration status given her lightheadedness.  She is given return precautions.   Orders Placed This Encounter  Procedures  . Basic Metabolic Panel (BMET)    No orders of the defined types were placed in this encounter.    Tommi Rumps, MD [place]

## 2018-08-12 NOTE — Patient Instructions (Addendum)
Nice to see you. You likely have a viral upper respiratory infection.  You should continue with Mucinex.  You can start Flonase over-the-counter.  This you should switch to regular Zyrtec after the next 1 to 2 days. If your symptoms do not improve by Monday please let us know.  If you worsen or develop shortness of breath or fevers please be reevaluated.

## 2018-08-13 LAB — BASIC METABOLIC PANEL
BUN: 16 mg/dL (ref 6–23)
CHLORIDE: 100 meq/L (ref 96–112)
CO2: 28 meq/L (ref 19–32)
Calcium: 9.6 mg/dL (ref 8.4–10.5)
Creatinine, Ser: 0.91 mg/dL (ref 0.40–1.20)
GFR: 63.73 mL/min (ref >60.00)
GLUCOSE: 92 mg/dL (ref 70–99)
Potassium: 4.6 mEq/L (ref 3.5–5.1)
Sodium: 136 mEq/L (ref 135–145)

## 2018-08-17 ENCOUNTER — Encounter: Payer: Self-pay | Admitting: Family Medicine

## 2018-08-17 ENCOUNTER — Ambulatory Visit (INDEPENDENT_AMBULATORY_CARE_PROVIDER_SITE_OTHER): Payer: Medicare HMO | Admitting: Family Medicine

## 2018-08-17 VITALS — BP 128/76 | HR 60 | Temp 97.6°F | Ht 63.0 in | Wt 162.0 lb

## 2018-08-17 DIAGNOSIS — R0982 Postnasal drip: Secondary | ICD-10-CM | POA: Diagnosis not present

## 2018-08-17 DIAGNOSIS — R059 Cough, unspecified: Secondary | ICD-10-CM

## 2018-08-17 DIAGNOSIS — R05 Cough: Secondary | ICD-10-CM | POA: Diagnosis not present

## 2018-08-17 DIAGNOSIS — J019 Acute sinusitis, unspecified: Secondary | ICD-10-CM

## 2018-08-17 MED ORDER — AMOXICILLIN-POT CLAVULANATE 875-125 MG PO TABS: 1.0000 | ORAL_TABLET | Freq: Two times a day (BID) | ORAL | 0 refills | Status: DC

## 2018-08-17 NOTE — Progress Notes (Signed)
Subjective:    Patient ID: Tammy Tate, female    DOB: 09-26-1941, 77 y.o.   MRN: [id number]  HPI  Presents to clinic c/o sinus congestion, pressure, fullness/heaviness in head for 12 to 13 days.  Patient has been using Zyrtec, Flonase and Mucinex DM with minimal relief in symptoms.  Does feel the Mucinex has helped her secretions become thinner, so it has not gone down into her chest. Does have some cough, mainly dry. Patient states her head feels so full, almost "swimmy" due to sinus pressure.  Patient states she was originally blowing clear mucus out of nose, now is turned to a thick yellow.  Patient Active Problem List   Diagnosis Date Noted  . URI (upper respiratory infection) 08/12/2018  . Chronic insomnia 07/16/2018  . Lumbar spinal stenosis 11/07/2017  . Degenerative lumbar disc 04/04/2016  . Idiopathic scoliosis 04/04/2016  . Visit for preventive health examination 03/11/2016  . Acid reflux 12/26/2015  . HLD (hyperlipidemia) 12/26/2015  . Increased MCV 12/26/2015  . Arthritis, degenerative 12/26/2015  . Shingles rash 11/21/2015  . Ingrown toenail 11/21/2015  . Polyarthritis of hand 11/21/2015  . Macrocytosis without anemia 03/05/2014  . S/P breast reconstruction, bilateral 03/05/2014  . Overweight (BMI 25.0-29.9) 03/05/2014  . Subacromial bursitis 07/06/2013  . S/P bilateral mastectomy 04/07/2013  . Esophagitis, reflux 02/22/2012  . DCIS (ductal carcinoma in situ) of breast, right   . Encounter for Medicare annual wellness exam 02/19/2012  . Allergic rhinitis 10/24/2011  . Screening for osteoporosis 10/23/2011  . Screening for colon cancer 10/23/2011  . Hypothyroidism 10/23/2011  . B12 deficiency    Social History   Tobacco Use  . Smoking status: Former Smoker    Last attempt to quit: 10/23/1971    Years since quitting: 46.8  . Smokeless tobacco: Never Used  Substance Use Topics  . Alcohol use: Yes    Alcohol/week: 7.0 standard drinks    Types: 7  Glasses of wine per week    Comment: occasional   Review of Systems  Constitutional: Negative for chills, fatigue and fever.  HENT: +nasal congestion, drainage, sinus pressure  Eyes: Negative.   Respiratory: +dry cough. Negative for shortness of breath and wheezing.   Cardiovascular: Negative for chest pain, palpitations and leg swelling.  Gastrointestinal: Negative for abdominal pain, diarrhea, nausea and vomiting.  Genitourinary: Negative for dysuria, frequency and urgency.  Musculoskeletal: Negative for arthralgias and myalgias.  Skin: Negative for color change, pallor and rash.  Neurological: Negative for syncope, light-headedness and headaches.  Psychiatric/Behavioral: The patient is not nervous/anxious.       Objective:   Physical Exam  Constitutional: She is oriented to person, place, and time. No distress.  HENT:  Head: Normocephalic and atraumatic.  Nose: Mucosal edema, rhinorrhea and sinus tenderness present. Right sinus exhibits frontal sinus tenderness. Left sinus exhibits frontal sinus tenderness.  +thick yellow nasal discharge in nasal passages, +post nasal drip. +fullness bilateral TMs  Eyes: Conjunctivae and EOM are normal.  Neck: Neck supple. No tracheal deviation present.  Cardiovascular: Normal rate, regular rhythm and normal heart sounds.  Pulmonary/Chest: Effort normal and breath sounds normal. No respiratory distress. She has no wheezes. She has no rales.  Neurological: She is alert and oriented to person, place, and time.  Skin: Skin is warm and dry. She is not diaphoretic. No pallor.  Psychiatric: She has a normal mood and affect. Her behavior is normal.  Nursing note and vitals reviewed.     Vitals:  08/17/18 1007  BP: 128/76  Pulse: 60  Temp: 97.6 F (36.4 C)  SpO2: 96%   Assessment & Plan:    Acute sinusitis - patient will use Augmentin twice daily for 10 days.  Advised to continue using Flonase and Zyrtec to help dry up nasal congestion.   Rest, increase fluids, do good handwashing.  Postnasal drip/cough - Zyrtec and Flonase will help improve postnasal drip.  Patient advised she can continue use Mucinex to help improve cough.  Lungs are clear on exam, so I suspect cough is directly related to postnasal drip.  Keep regularly scheduled follow-up with PCP as planned.  Patient advised to return to clinic sooner if new issues arise or if current symptoms persist or worsen.

## 2018-08-18 DIAGNOSIS — D2272 Melanocytic nevi of left lower limb, including hip: Secondary | ICD-10-CM | POA: Diagnosis not present

## 2018-08-18 DIAGNOSIS — D2262 Melanocytic nevi of left upper limb, including shoulder: Secondary | ICD-10-CM | POA: Diagnosis not present

## 2018-08-18 DIAGNOSIS — L570 Actinic keratosis: Secondary | ICD-10-CM | POA: Diagnosis not present

## 2018-08-18 DIAGNOSIS — L820 Inflamed seborrheic keratosis: Secondary | ICD-10-CM | POA: Diagnosis not present

## 2018-08-18 DIAGNOSIS — Z85828 Personal history of other malignant neoplasm of skin: Secondary | ICD-10-CM | POA: Diagnosis not present

## 2018-08-18 DIAGNOSIS — X32XXXA Exposure to sunlight, initial encounter: Secondary | ICD-10-CM | POA: Diagnosis not present

## 2018-08-18 DIAGNOSIS — L300 Nummular dermatitis: Secondary | ICD-10-CM | POA: Diagnosis not present

## 2018-08-18 DIAGNOSIS — R208 Other disturbances of skin sensation: Secondary | ICD-10-CM | POA: Diagnosis not present

## 2018-08-18 DIAGNOSIS — L538 Other specified erythematous conditions: Secondary | ICD-10-CM | POA: Diagnosis not present

## 2018-09-08 ENCOUNTER — Ambulatory Visit (INDEPENDENT_AMBULATORY_CARE_PROVIDER_SITE_OTHER): Payer: Medicare HMO

## 2018-09-08 VITALS — BP 110/70 | HR 71 | Temp 97.8°F | Resp 15 | Ht 63.0 in | Wt 164.0 lb

## 2018-09-08 DIAGNOSIS — Z0000 Encounter for general adult medical examination without abnormal findings: Secondary | ICD-10-CM | POA: Diagnosis not present

## 2018-09-08 NOTE — Patient Instructions (Addendum)
  Tammy Tate , Thank you for taking time to come for your Medicare Wellness Visit. I appreciate your ongoing commitment to your health goals. Please review the following plan we discussed and let me know if I can assist you in the future.   These are the goals we discussed: Goals      Patient Stated   . Weight (lb) < 155 lb (70.3 kg) (pt-stated)     7,000 steps daily Healthy diet       This is a list of the screening recommended for you and due dates:  Health Maintenance  Topic Date Due  . Tetanus Vaccine  02/26/2019  . Flu Shot  Completed  . DEXA scan (bone density measurement)  Completed  . Pneumonia vaccines  Completed

## 2018-09-08 NOTE — Progress Notes (Signed)
Subjective:   Tammy Tate is a 77 y.o. female who presents for Medicare Annual (Subsequent) preventive examination.  Review of Systems:  No ROS.  Medicare Wellness Visit. Additional risk factors are reflected in the social history. Cardiac Risk Factors include: advanced age (>58men, >65 women);hypertension     Objective:     Vitals: BP 110/70 (BP Location: Left Arm, Patient Position: Sitting, Cuff Size: Normal)   Pulse 71   Temp 97.8 F (36.6 C) (Oral)   Resp 15   Ht 5\' 3"  (1.6 m)   Wt 164 lb (74.4 kg)   SpO2 96%   BMI 29.05 kg/m   Body mass index is 29.05 kg/m.  Advanced Directives 09/08/2018 09/02/2017 06/17/2016 02/09/2015  Does Patient Have a Medical Advance Directive? Yes Yes Yes Yes  Type of Paramedic of [place];Living will Living will;Healthcare Power of [place];Living will Living will;Healthcare Power of [place];Advance instruction for mental health treatment  Does patient want to make changes to medical advance directive? No - Patient declined No - Patient declined - Yes - information given  Copy of [place] in Chart? Yes - validated most recent copy scanned in chart (See row information) No - copy requested No - copy requested No - copy requested    Tobacco Social History   Tobacco Use  Smoking Status Former Smoker  . Last attempt to quit: 10/23/1971  . Years since quitting: 46.9  Smokeless Tobacco Never Used     Counseling given: Not Answered   Clinical Intake:  Pre-visit preparation completed: Yes  Pain : No/denies pain     Nutritional Status: BMI 25 -29 Overweight Diabetes: No  How often do you need to have someone help you when you read instructions, pamphlets, or other written materials from your doctor or pharmacy?: 1 - Never  Interpreter Needed?: No     Past Medical History:  Diagnosis Date  . Allergy   . B12 deficiency   . Bronchitis    hx of recurrent  and bronchospasm  . Cancer [facility]) 2010   DCIS , right  . Chicken pox   . Degenerative arthritis of lumbar spine   . Degenerative disk disease    [facility] 08-04-2007  . GERD (gastroesophageal reflux disease)   . Migraine with aura   . Thyroid disease   . Vasomotor rhinitis    Past Surgical History:  Procedure Laterality Date  . ABDOMINAL HYSTERECTOMY     with prior HRT  . APPENDECTOMY    . MODIFIED RADICAL MASTECTOMY W/ AXILLARY LYMPH NODE DISSECTION W/ PECTORALIS MINOR PRESERVATION  2010   bilateral, for DCIS,  [name] , [facility]   Family History  Problem Relation Age of Onset  . Cancer Mother 24       esophgeal CA mets to lungs, spine   . Stroke Father 31       tobacco abuse, hyperlipidemia  . Hyperlipidemia Father   . Heart disease Father   . Diabetes Maternal Grandfather   . Pancreatitis Daughter    Social History   Socioeconomic History  . Marital status: Married    Spouse name: Not on file  . Number of children: Not on file  . Years of education: Not on file  . Highest education level: Not on file  Occupational History  . Not on file  Social Needs  . Financial resource strain: Not hard at all  . Food insecurity:    Worry: Never true  Inability: Never true  . Transportation needs:    Medical: No    Non-medical: No  Tobacco Use  . Smoking status: Former Smoker    Last attempt to quit: 10/23/1971    Years since quitting: 46.9  . Smokeless tobacco: Never Used  Substance and Sexual Activity  . Alcohol use: Yes    Alcohol/week: 7.0 standard drinks    Types: 7 Glasses of wine per week    Comment: occasional  . Drug use: No  . Sexual activity: Not Currently  Lifestyle  . Physical activity:    Days per week: 2 days    Minutes per session: 20 min  . Stress: Not at all  Relationships  . Social connections:    Talks on phone: Not on file    Gets together: Not on file    Attends religious service: Not on file    Active member of club or organization: Not on file      Attends meetings of clubs or organizations: Not on file    Relationship status: Married  Other Topics Concern  . Not on file  Social History Narrative  . Not on file    Outpatient Encounter Medications as of 09/08/2018  Medication Sig  . Biotin (SUPER BIOTIN) 5 MG TABS Take by mouth.  . Cholecalciferol (VITAMIN D) 2000 UNITS tablet Take 2,000 Units by mouth daily.  Marland Kitchen levothyroxine (SYNTHROID, LEVOTHROID) 88 MCG tablet TAKE 1 TABLET BY MOUTH DAILY  . Omega-3 Fatty Acids (OMEGA 3 PO) Take by mouth daily.  . predniSONE (DELTASONE) 10 MG tablet 6 tablets on Day 1 , then reduce by 1 tablet daily until gone  . tizanidine (ZANAFLEX) 2 MG capsule Take 1 capsule (2 mg total) by mouth 3 (three) times daily as needed for muscle spasms.  . traMADol (ULTRAM) 50 MG tablet Take 1 tablet (50 mg total) by mouth every 8 (eight) hours as needed.  . vitamin B-12 (CYANOCOBALAMIN) 1000 MCG tablet Take 1,000 mcg by mouth daily.  Marland Kitchen zolpidem (AMBIEN) 10 MG tablet TAKE ONE TABLET BY MOUTH AT BEDTIME AS NEEDED  . [DISCONTINUED] amoxicillin-clavulanate (AUGMENTIN) 875-125 MG tablet Take 1 tablet by mouth 2 (two) times daily.  . [DISCONTINUED] gabapentin (NEURONTIN) 100 MG capsule 1 tablet at breakfast, lunch and dinner  3 at bedtime  . [DISCONTINUED] loperamide (IMODIUM A-D) 2 MG tablet Take 1 tablet (2 mg total) by mouth 4 (four) times daily as needed for diarrhea or loose stools.   No facility-administered encounter medications on file as of 09/08/2018.     Activities of Daily Living In your present state of health, do you have any difficulty performing the following activities: 09/08/2018  Hearing? N  Vision? N  Difficulty concentrating or making decisions? N  Walking or climbing stairs? N  Dressing or bathing? N  Doing errands, shopping? N  Preparing Food and eating ? N  Using the Toilet? N  In the past six months, have you accidently leaked urine? N  Do you have problems with loss of bowel control?  N  Managing your Medications? N  Managing your Finances? N  Housekeeping or managing your Housekeeping? N  Some recent data might be hidden    Patient Care Team: Crecencio Mc, MD as PCP - General (Internal Medicine)    Assessment:   This is a routine wellness examination for Tammy Tate.  The goal of the wellness visit is to assist the patient how to close the gaps in care and create a preventative  care plan for the patient.   The roster of all physicians providing medical care to patient is listed in the Snapshot section of the chart.  Taking calcium VIT D as appropriate/Osteoporosis risk reviewed.    Safety issues reviewed; Smoke and carbon monoxide detectors in the home. No firearms in the home. Wears seatbelts when driving or riding with others. No violence in the home.  They do not have excessive sun exposure.  Discussed the need for sun protection: hats, long sleeves and the use of sunscreen if there is significant sun exposure.  Patient is alert, normal appearance, oriented to person/place/and time. Correctly identified the president of the [place] and recalls of 2/3 words. Performs simple calculations and can read correct time from watch face.  Displays appropriate judgement.  No new identified risk were noted.  No failures at ADL's or IADL's.    BMI- discussed the importance of a healthy diet, water intake and the benefits of aerobic exercise. Educational material provided.   Diet-low carb  Dental- every 9 months.  Sleep patterns- Sleep is broken due to chronic lower back pain. Taking Aleve PM nightly, with food. Neurontin discontinued per patient preference.   Health maintenance gaps- closed.  Patient Concerns: None at this time. Follow up with PCP as needed.  Exercise Activities and Dietary recommendations Current Exercise Habits: Home exercise routine, Type of exercise: yoga;walking;calisthenics(pilates), Time (Minutes): 20, Frequency (Times/Week): 2, Weekly  Exercise (Minutes/Week): 40, Intensity: Mild  Goals      Patient Stated   . Weight (lb) < 155 lb (70.3 kg) (pt-stated)     7,000 steps daily Healthy diet       Fall Risk Fall Risk  09/08/2018 09/02/2017 06/17/2016 03/09/2015 03/05/2014  Falls in the past year? 1 No No No No   Depression Screen PHQ 2/9 Scores 09/08/2018 09/02/2017 06/17/2016 03/09/2015  PHQ - 2 Score 0 0 0 0     Cognitive Function MMSE - Mini Mental State Exam 09/08/2018 06/17/2016  Orientation to time 5 5  Orientation to Place 5 5  Registration 3 3  Attention/ Calculation 5 5  Recall 2 3  Language- name 2 objects 2 2  Language- repeat 1 1  Language- follow 3 step command 3 3  Language- read & follow direction 1 1  Write a sentence 1 1  Copy design 1 1  Total score 29 30     6CIT Screen 09/02/2017  What Year? 0 points  What month? 0 points  What time? 0 points  Count back from 20 0 points  Months in reverse 0 points  Repeat phrase 0 points  Total Score 0    Immunization History  Administered Date(s) Administered  . Influenza Split 07/02/2013, 07/11/2014  . Influenza, High Dose Seasonal PF 06/17/2016, 06/26/2017, 07/15/2018  . Influenza-Unspecified 07/02/2013  . Pneumococcal Conjugate-13 03/02/2014  . Pneumococcal Polysaccharide-23 03/03/2007, 03/19/2018  . Tdap 02/25/2009  . Zoster 03/27/2011  . Zoster Recombinat (Shingrix) 12/19/2017    Screening Tests Health Maintenance  Topic Date Due  . TETANUS/TDAP  02/26/2019  . INFLUENZA VACCINE  Completed  . DEXA SCAN  Completed  . PNA vac Low Risk Adult  Completed      Plan:    End of life planning; Advance aging; Advanced directives discussed. Copy of current HCPOA/Living Will on file.    I have personally reviewed and noted the following in the patient's chart:   . Medical and social history . Use of alcohol, tobacco or illicit drugs  .  Current medications and supplements . Functional ability and status . Nutritional status . Physical  activity . Advanced directives . List of other physicians . Hospitalizations, surgeries, and ER visits in previous 12 months . Vitals . Screenings to include cognitive, depression, and falls . Referrals and appointments  In addition, I have reviewed and discussed with patient certain preventive protocols, quality metrics, and best practice recommendations. A written personalized care plan for preventive services as well as general preventive health recommendations were provided to patient.     Varney Biles, LPN  [id number]   Reviewed above information.  Agree with assessment and plan.    Dr Nicki Reaper

## 2018-11-08 DIAGNOSIS — E785 Hyperlipidemia, unspecified: Secondary | ICD-10-CM

## 2018-11-08 DIAGNOSIS — E034 Atrophy of thyroid (acquired): Secondary | ICD-10-CM

## 2018-11-08 DIAGNOSIS — R5383 Other fatigue: Secondary | ICD-10-CM

## 2018-11-08 DIAGNOSIS — E538 Deficiency of other specified B group vitamins: Secondary | ICD-10-CM

## 2018-11-08 DIAGNOSIS — Z79899 Other long term (current) drug therapy: Secondary | ICD-10-CM

## 2018-11-09 NOTE — Telephone Encounter (Signed)
Pt has not had labs drawn since 02/2018 and it said for her to repeat labs in 6 months. I ordered CBC, CMP, Lipid, TSH, Vitamin B12. Is there anything else that needs to be ordered?

## 2018-11-16 ENCOUNTER — Other Ambulatory Visit: Payer: Self-pay

## 2018-11-16 MED ORDER — LEVOTHYROXINE SODIUM 88 MCG PO TABS: 88.0000 ug | ORAL_TABLET | Freq: Every day | ORAL | 0 refills | Status: DC

## 2018-11-17 ENCOUNTER — Other Ambulatory Visit (INDEPENDENT_AMBULATORY_CARE_PROVIDER_SITE_OTHER): Payer: Medicare HMO

## 2018-11-17 DIAGNOSIS — E785 Hyperlipidemia, unspecified: Secondary | ICD-10-CM | POA: Diagnosis not present

## 2018-11-17 DIAGNOSIS — E034 Atrophy of thyroid (acquired): Secondary | ICD-10-CM

## 2018-11-17 DIAGNOSIS — R5383 Other fatigue: Secondary | ICD-10-CM | POA: Diagnosis not present

## 2018-11-17 DIAGNOSIS — Z79899 Other long term (current) drug therapy: Secondary | ICD-10-CM

## 2018-11-17 DIAGNOSIS — E538 Deficiency of other specified B group vitamins: Secondary | ICD-10-CM

## 2018-11-17 LAB — COMPREHENSIVE METABOLIC PANEL
ALT: 13 U/L (ref 0–35)
AST: 16 U/L (ref 0–37)
Albumin: 4.3 g/dL (ref 3.5–5.2)
Alkaline Phosphatase: 71 U/L (ref 39–117)
BUN: 20 mg/dL (ref 6–23)
CO2: 29 meq/L (ref 19–32)
Calcium: 9.2 mg/dL (ref 8.4–10.5)
Chloride: 103 mEq/L (ref 96–112)
Creatinine, Ser: 0.92 mg/dL (ref 0.40–1.20)
GFR: 59.17 mL/min — ABNORMAL LOW (ref >60.00)
Glucose, Bld: 88 mg/dL (ref 70–99)
Potassium: 3.8 mEq/L (ref 3.5–5.1)
Sodium: 139 mEq/L (ref 135–145)
Total Bilirubin: 0.5 mg/dL (ref 0.2–1.2)
Total Protein: 7 g/dL (ref 6.0–8.3)

## 2018-11-17 LAB — CBC WITH DIFFERENTIAL/PLATELET
Basophils Absolute: 0 10*3/uL (ref 0.0–0.1)
Basophils Relative: 0.5 % (ref 0.0–3.0)
EOS ABS: 0.1 10*3/uL (ref 0.0–0.7)
Eosinophils Relative: 2 % (ref 0.0–5.0)
HCT: 43.1 % (ref 36.0–46.0)
Hemoglobin: 14.7 g/dL (ref 12.0–15.0)
Lymphocytes Relative: 14.8 % (ref 12.0–46.0)
Lymphs Abs: 1 10*3/uL (ref 0.7–4.0)
MCHC: 34.2 g/dL (ref 30.0–36.0)
MCV: 99.6 fl (ref 78.0–100.0)
Monocytes Absolute: 0.6 10*3/uL (ref 0.1–1.0)
Monocytes Relative: 9.8 % (ref 3.0–12.0)
Neutro Abs: 4.7 10*3/uL (ref 1.4–7.7)
Neutrophils Relative %: 72.9 % (ref 43.0–77.0)
Platelets: 297 10*3/uL (ref 150.0–400.0)
RBC: 4.33 Mil/uL (ref 3.87–5.11)
RDW: 12.6 % (ref 11.5–15.5)
WBC: 6.5 10*3/uL (ref 4.0–10.5)

## 2018-11-17 LAB — LIPID PANEL
Cholesterol: 207 mg/dL — ABNORMAL HIGH (ref 0–200)
HDL: 78.8 mg/dL (ref >39.00)
LDL Cholesterol: 109 mg/dL — ABNORMAL HIGH (ref 0–99)
NonHDL: 127.86
TRIGLYCERIDES: 94 mg/dL (ref 0.0–149.0)
Total CHOL/HDL Ratio: 3
VLDL: 18.8 mg/dL (ref 0.0–40.0)

## 2018-11-17 LAB — TSH: TSH: 1.98 u[IU]/mL (ref 0.35–4.50)

## 2018-11-17 LAB — VITAMIN B12: Vitamin B-12: 335 pg/mL (ref 211–911)

## 2019-01-11 DIAGNOSIS — M7041 Prepatellar bursitis, right knee: Secondary | ICD-10-CM | POA: Diagnosis not present

## 2019-01-15 ENCOUNTER — Ambulatory Visit: Payer: Medicare HMO | Admitting: Internal Medicine

## 2019-01-19 ENCOUNTER — Ambulatory Visit: Payer: Medicare HMO | Admitting: Internal Medicine

## 2019-02-16 ENCOUNTER — Ambulatory Visit: Payer: Medicare HMO | Admitting: Family Medicine

## 2019-02-19 ENCOUNTER — Ambulatory Visit: Payer: Self-pay | Admitting: *Deleted

## 2019-02-19 DIAGNOSIS — R000 Tachycardia, unspecified: Secondary | ICD-10-CM | POA: Diagnosis not present

## 2019-02-19 DIAGNOSIS — R0600 Dyspnea, unspecified: Secondary | ICD-10-CM | POA: Diagnosis not present

## 2019-02-19 DIAGNOSIS — D649 Anemia, unspecified: Secondary | ICD-10-CM | POA: Diagnosis not present

## 2019-02-19 NOTE — Telephone Encounter (Signed)
Pt calling with complaints of feeling breathless with going up and down stairs and states this is not her normal.Pt also states she has experienced difficulty breathing when lying down in bed for the past 2 nights.  Pt states that she got a little woozy when giving blood on Wed. Pt states that today "every time I do something gets breathless with going up and down stairs, really breathless. Today with sitting and walking from dinning room to kitchen. Felt breathless with doing yoga this morning". Pt states she checked her pulse and it was noted to be 32 in 20 sec. Pt advised to seek treatment in the ED for current symptoms. Pt verbalized understanding but states she will not go to the ED but will go to [facility] for current symptoms.    Reason for Disposition . Difficulty breathing  Answer Assessment - Initial Assessment Questions 1. DESCRIPTION: "Please describe your heart rate or heart beat that you are having" (e.g., fast/slow, regular/irregular, skipped or extra beats, "palpitations")     Pt states she feels like her heart is feeling bad 2. ONSET: "When did it start?" (Minutes, hours or days)      Has felt for the last 2 nights 3. DURATION: "How long does it last" (e.g., seconds, minutes, hours)     Has been ongoing with for the past 2 nights 4. PATTERN "Does it come and go, or has it been constant since it started?"  "Does it get worse with exertion?"   "Are you feeling it now?"     Comes and goes wi 5. TAP: "Using your hand, can you tap out what you are feeling on a chair or table in front of you, so that I can hear?" (Note: not all patients can do this)       Not assessed 6. HEART RATE: "Can you tell me your heart rate?" "How many beats in 15 seconds?"  (Note: not all patients can do this)       32 within 20 sec 7. RECURRENT SYMPTOM: "Have you ever had this before?" If so, ask: "When was the last time?" and "What happened that time?"      For the past 2 nights 8. CAUSE: "What do you  think is causing the palpitations?"     Unsure 9. CARDIAC HISTORY: "Do you have any history of heart disease?" (e.g., heart attack, angina, bypass surgery, angioplasty, arrhythmia)      No history 10. OTHER SYMPTOMS: "Do you have any other symptoms?" (e.g., dizziness, chest pain, sweating, difficulty breathing)       Shortness of breath 11. PREGNANCY: "Is there any chance you are pregnant?" "When was your last menstrual period?"       n/a  Protocols used: HEART RATE AND HEARTBEAT QUESTIONS-A-AH

## 2019-02-19 NOTE — Telephone Encounter (Signed)
  Reason for Disposition . [1] MODERATE difficulty breathing (e.g., speaks in phrases, SOB even at rest, pulse 100-120) AND [2] NEW-onset or WORSE than normal  Answer Assessment - Initial Assessment Questions 1. RESPIRATORY STATUS: "Describe your breathing?" (e.g., wheezing, shortness of breath, unable to speak, severe coughing)      Short of breath with going up and down stairs 2. ONSET: "When did this breathing problem begin?"      Experienced today and also 2 nights ago with lying down 3. PATTERN "Does the difficult breathing come and go, or has it been constant since it started?"      Comes and goes with activity 4. SEVERITY: "How bad is your breathing?" (e.g., mild, moderate, severe)    - MILD: No SOB at rest, mild SOB with walking, speaks normally in sentences, can lay down, no retractions, pulse < 100.    - MODERATE: SOB at rest, SOB with minimal exertion and prefers to sit, cannot lie down flat, speaks in phrases, mild retractions, audible wheezing, pulse 100-120.    - SEVERE: Very SOB at rest, speaks in single words, struggling to breathe, sitting hunched forward, retractions, pulse > 120      moderate 5. RECURRENT SYMPTOM: "Have you had difficulty breathing before?" If so, ask: "When was the last time?" and "What happened that time?"      Not assessed 6. CARDIAC HISTORY: "Do you have any history of heart disease?" (e.g., heart attack, angina, bypass surgery, angioplasty)       7. LUNG HISTORY: "Do you have any history of lung disease?"  (e.g., pulmonary embolus, asthma, emphysema)      8. CAUSE: "What do you think is causing the breathing problem?"       9. OTHER SYMPTOMS: "Do you have any other symptoms? (e.g., dizziness, runny nose, cough, chest pain, fever)      10. PREGNANCY: "Is there any chance you are pregnant?" "When was your last menstrual period?"        11. TRAVEL: "Have you traveled out of the country in the last month?" (e.g., travel history, exposures)  Protocols  used: BREATHING DIFFICULTY-A-AH

## 2019-02-22 NOTE — Telephone Encounter (Signed)
Per Chart patient was seen at [facility] walk in clinic on 02/19/19

## 2019-02-24 ENCOUNTER — Other Ambulatory Visit: Payer: Self-pay | Admitting: Internal Medicine

## 2019-02-24 NOTE — Telephone Encounter (Signed)
Pt called to report that according to her pharmacy, they sent the request for levothyroxine on 02/19/2019. Pt made aware that the request was received 02/24/2019. Pt states that she is completely out of medication and needs refill as soon as possible.

## 2019-03-10 ENCOUNTER — Other Ambulatory Visit
Admission: RE | Admit: 2019-03-10 | Discharge: 2019-03-10 | Disposition: A | Discharge status: Home / Self Care | Payer: Medicare HMO | Source: Ambulatory Visit | Attending: Ophthalmology | Admitting: Ophthalmology

## 2019-03-10 DIAGNOSIS — H5711 Ocular pain, right eye: Secondary | ICD-10-CM | POA: Insufficient documentation

## 2019-03-10 LAB — CBC WITH DIFFERENTIAL/PLATELET
Abs Immature Granulocytes: 0.01 10*3/uL (ref 0.00–0.07)
Basophils Absolute: 0.1 10*3/uL (ref 0.0–0.1)
Basophils Relative: 1 %
Eosinophils Absolute: 0.3 10*3/uL (ref 0.0–0.5)
Eosinophils Relative: 4 %
HCT: 34.4 % — ABNORMAL LOW (ref 36.0–46.0)
Hemoglobin: 11.5 g/dL — ABNORMAL LOW (ref 12.0–15.0)
Immature Granulocytes: 0 %
Lymphocytes Relative: 20 %
Lymphs Abs: 1.2 10*3/uL (ref 0.7–4.0)
MCH: 31.9 pg (ref 26.0–34.0)
MCHC: 33.4 g/dL (ref 30.0–36.0)
MCV: 95.6 fL (ref 80.0–100.0)
Monocytes Absolute: 0.8 10*3/uL (ref 0.1–1.0)
Monocytes Relative: 13 %
Neutro Abs: 3.8 10*3/uL (ref 1.7–7.7)
Neutrophils Relative %: 62 %
Platelets: 346 10*3/uL (ref 150–400)
RBC: 3.6 MIL/uL — ABNORMAL LOW (ref 3.87–5.11)
RDW: 11.9 % (ref 11.5–15.5)
WBC: 6 10*3/uL (ref 4.0–10.5)
nRBC: 0 % (ref 0.0–0.2)

## 2019-03-10 LAB — SEDIMENTATION RATE: Sed Rate: 20 mm/hr (ref 0–30)

## 2019-03-10 LAB — C-REACTIVE PROTEIN: CRP: <0.8 mg/dL (ref <1.0)

## 2019-03-19 ENCOUNTER — Ambulatory Visit: Payer: Medicare HMO | Admitting: Internal Medicine

## 2019-04-19 DIAGNOSIS — Z85828 Personal history of other malignant neoplasm of skin: Secondary | ICD-10-CM | POA: Diagnosis not present

## 2019-04-19 DIAGNOSIS — B079 Viral wart, unspecified: Secondary | ICD-10-CM | POA: Diagnosis not present

## 2019-04-19 DIAGNOSIS — X32XXXA Exposure to sunlight, initial encounter: Secondary | ICD-10-CM | POA: Diagnosis not present

## 2019-04-19 DIAGNOSIS — D485 Neoplasm of uncertain behavior of skin: Secondary | ICD-10-CM | POA: Diagnosis not present

## 2019-04-19 DIAGNOSIS — L570 Actinic keratosis: Secondary | ICD-10-CM | POA: Diagnosis not present

## 2019-04-19 DIAGNOSIS — Z08 Encounter for follow-up examination after completed treatment for malignant neoplasm: Secondary | ICD-10-CM | POA: Diagnosis not present

## 2019-04-23 ENCOUNTER — Telehealth: Payer: Medicare HMO | Admitting: Family

## 2019-04-23 DIAGNOSIS — R519 Headache, unspecified: Secondary | ICD-10-CM

## 2019-04-23 NOTE — Progress Notes (Signed)
Based on what you shared with me, I feel your condition warrants further evaluation and I recommend that you be seen for a face to face office visit.  We would like to evaluate you more closely to ne sure that this is not a serious issue since it has never quite happened like this before.   NOTE: If you entered your credit card information for this eVisit, you will not be charged. You may see a "hold" on your card for the $35 but that hold will drop off and you will not have a charge processed.  If you are having a true medical emergency please call 911.     For an urgent face to face visit, [facility] has five urgent care centers for your convenience:    [email or web address] to reserve your spot online an avoid wait times  [facility] (New Address!) [address], [place], [place] [address] *Just off [address], across the road from [facility] hours of operation: Monday-Friday, 12 PM to 6 PM  Closed Saturday & Sunday   The following sites will take your insurance:  . [facility]    [phone]                  Get Driving Directions  [address], [place] [address] . 10 am to 8 pm Monday-Friday . 12 pm to 8 pm Saturday-Sunday   . [facility] [place]                  Get Driving Directions  [address], [place], [place] [address] . 8 am to 8 pm Monday-Friday . 9 am to 6 pm Saturday . 11 am to 6 pm Sunday   . [facility] at [place]                  Get Driving Directions   [address] [place], [place] [address] . 8 am to 8 pm Monday-Friday . 8 am to 4 pm Saturday-Sunday    . [facility] at [place]                    Get Driving Directions  [phone]  [address], [place], [place] [address]  . Monday-Friday, 12 PM to 6 PM    Your e-visit  answers were reviewed by a board certified advanced clinical practitioner to complete your personal care plan.  Thank you for using e-Visits.

## 2019-04-27 DIAGNOSIS — M47814 Spondylosis without myelopathy or radiculopathy, thoracic region: Secondary | ICD-10-CM | POA: Diagnosis not present

## 2019-04-27 DIAGNOSIS — M4015 Other secondary kyphosis, thoracolumbar region: Secondary | ICD-10-CM | POA: Diagnosis not present

## 2019-04-27 DIAGNOSIS — Z6828 Body mass index (BMI) 28.0-28.9, adult: Secondary | ICD-10-CM | POA: Diagnosis not present

## 2019-04-27 DIAGNOSIS — M545 Low back pain: Secondary | ICD-10-CM | POA: Diagnosis not present

## 2019-04-27 DIAGNOSIS — M7061 Trochanteric bursitis, right hip: Secondary | ICD-10-CM | POA: Diagnosis not present

## 2019-04-27 DIAGNOSIS — R030 Elevated blood-pressure reading, without diagnosis of hypertension: Secondary | ICD-10-CM | POA: Diagnosis not present

## 2019-04-27 DIAGNOSIS — M7060 Trochanteric bursitis, unspecified hip: Secondary | ICD-10-CM | POA: Insufficient documentation

## 2019-04-27 DIAGNOSIS — M4155 Other secondary scoliosis, thoracolumbar region: Secondary | ICD-10-CM | POA: Diagnosis not present

## 2019-04-27 DIAGNOSIS — G8929 Other chronic pain: Secondary | ICD-10-CM | POA: Diagnosis not present

## 2019-04-27 DIAGNOSIS — M5136 Other intervertebral disc degeneration, lumbar region: Secondary | ICD-10-CM | POA: Diagnosis not present

## 2019-04-27 DIAGNOSIS — M546 Pain in thoracic spine: Secondary | ICD-10-CM | POA: Diagnosis not present

## 2019-04-27 DIAGNOSIS — M47816 Spondylosis without myelopathy or radiculopathy, lumbar region: Secondary | ICD-10-CM | POA: Diagnosis not present

## 2019-04-27 DIAGNOSIS — M5134 Other intervertebral disc degeneration, thoracic region: Secondary | ICD-10-CM | POA: Diagnosis not present

## 2019-04-28 DIAGNOSIS — R69 Illness, unspecified: Secondary | ICD-10-CM | POA: Diagnosis not present

## 2019-05-05 ENCOUNTER — Other Ambulatory Visit: Payer: Self-pay

## 2019-05-07 ENCOUNTER — Ambulatory Visit (INDEPENDENT_AMBULATORY_CARE_PROVIDER_SITE_OTHER): Payer: Medicare HMO | Admitting: Internal Medicine

## 2019-05-07 ENCOUNTER — Encounter: Payer: Self-pay | Admitting: Internal Medicine

## 2019-05-07 ENCOUNTER — Encounter

## 2019-05-07 ENCOUNTER — Other Ambulatory Visit: Payer: Self-pay

## 2019-05-07 VITALS — BP 120/68 | HR 66 | Temp 98.0°F | Resp 15 | Ht 63.0 in | Wt 161.6 lb

## 2019-05-07 DIAGNOSIS — D649 Anemia, unspecified: Secondary | ICD-10-CM

## 2019-05-07 DIAGNOSIS — Z0000 Encounter for general adult medical examination without abnormal findings: Secondary | ICD-10-CM | POA: Diagnosis not present

## 2019-05-07 DIAGNOSIS — E782 Mixed hyperlipidemia: Secondary | ICD-10-CM | POA: Diagnosis not present

## 2019-05-07 DIAGNOSIS — E034 Atrophy of thyroid (acquired): Secondary | ICD-10-CM | POA: Diagnosis not present

## 2019-05-07 NOTE — Progress Notes (Signed)
Patient ID: Tammy Tate, female    DOB: [birth year]  Age: 78 y.o. MRN: [id number]  The patient is here for annual preventie examination and management of other chronic and acute problems.   S/p bilateral mastectomy Colonoscopy 2015 normal; ten year follow up  DEXA 2017: normal  The risk factors are reflected in the social history.  The roster of all physicians providing medical care to patient - is listed in the Snapshot section of the chart.  Activities of daily living:  The patient is 100% independent in all ADLs: dressing, toileting, feeding as well as independent mobility  Home safety : The patient has smoke detectors in the home. They wear seatbelts.  There are no firearms at home. There is no violence in the home.   There is no risks for hepatitis, STDs or HIV. There is no   history of blood transfusion. They have no travel history to infectious disease endemic areas of the world.  The patient has seen their dentist in the last six month. They have seen their eye doctor in the last year. They admit to slight hearing difficulty with regard to whispered voices and some television programs.  They have deferred audiologic testing in the last year.  They do not  have excessive sun exposure. Discussed the need for sun protection: hats, long sleeves and use of sunscreen if there is significant sun exposure.   Diet: the importance of a healthy diet is discussed. They do have a healthy diet.  The benefits of regular aerobic exercise were discussed. She walks 4 times per week ,  20 minutes.   Depression screen: there are no signs or vegative symptoms of depression- irritability, change in appetite, anhedonia, sadness/tearfullness.  Cognitive assessment: the patient manages all their financial and personal affairs and is actively engaged. They could relate day,date,year and events; recalled 2/3 objects at 3 minutes; performed clock-face test normally.  The following portions of the patient's  history were reviewed and updated as appropriate: allergies, current medications, past family history, past medical history,  past surgical history, past social history  and problem list.  Visual acuity was not assessed per patient preference since she has regular follow up with her ophthalmologist. Hearing and body mass index were assessed and reviewed.   During the course of the visit the patient was educated and counseled about appropriate screening and preventive services including : fall prevention , diabetes screening, nutrition counseling, colorectal cancer screening, and recommended immunizations.    CC: The primary encounter diagnosis was Mixed hyperlipidemia. Diagnoses of Hypothyroidism due to acquired atrophy of thyroid, Anemia, unspecified type, and Visit for preventive health examination were also pertinent to this visit.  Evaluated at urgent Care with nausea  tachycardia and weakness in June  hgb was 11.2 , down from 14 in march .  She had donated blood twice in 8 weeks prior to onset of symptoms.  Her blood type is in demand as a universal donor ab positive     Recurrent sciatica right side symptoms .   Saw Nudelman who diagnosed trochanteric bursitis with steroid injection with ongoing relief 3 weeks ago,   Has follow up Sept 2 for MRI lumbar spine   History Glynda has a past medical history of Allergy, B12 deficiency, Bronchitis, Cancer ([place]) (2010), Chicken pox, Degenerative arthritis of lumbar spine, Degenerative disk disease, GERD (gastroesophageal reflux disease), Migraine with aura, Thyroid disease, and Vasomotor rhinitis.   She has a past surgical history that includes Appendectomy; Modified radical  mastectomy w/ axillary lymph node dissection w/ pectoral minor preservation (2010); and Abdominal hysterectomy.   Her family history includes Cancer (age of onset: 54) in her mother; Diabetes in her maternal grandfather; Heart disease in her father; Hyperlipidemia in her father;  Pancreatitis in her daughter; Stroke (age of onset: 54) in her father.She reports that she quit smoking about 47 years ago. She has never used smokeless tobacco. She reports current alcohol use of about 7.0 standard drinks of alcohol per week. She reports that she does not use drugs.  Outpatient Medications Prior to Visit  Medication Sig Dispense Refill  . levothyroxine (SYNTHROID) 88 MCG tablet TAKE 1 TABLET EVERY DAY ON EMPTY STOMACHWITH A GLASS OF WATER AT LEAST 30-60 MINBEFORE BREAKFAST 90 tablet 0  . zolpidem (AMBIEN) 10 MG tablet TAKE ONE TABLET BY MOUTH AT BEDTIME AS NEEDED 30 tablet 2  . Biotin (SUPER BIOTIN) 5 MG TABS Take by mouth.    . Cholecalciferol (VITAMIN D) 2000 UNITS tablet Take 2,000 Units by mouth daily.    . Omega-3 Fatty Acids (OMEGA 3 PO) Take by mouth daily.    . tizanidine (ZANAFLEX) 2 MG capsule Take 1 capsule (2 mg total) by mouth 3 (three) times daily as needed for muscle spasms. (Patient not taking: Reported on 05/07/2019) 30 capsule 3  . vitamin B-12 (CYANOCOBALAMIN) 1000 MCG tablet Take 1,000 mcg by mouth daily.    . predniSONE (DELTASONE) 10 MG tablet 6 tablets on Day 1 , then reduce by 1 tablet daily until gone 21 tablet 0  . traMADol (ULTRAM) 50 MG tablet Take 1 tablet (50 mg total) by mouth every 8 (eight) hours as needed. (Patient not taking: Reported on 05/07/2019) 30 tablet 0   No facility-administered medications prior to visit.     Review of Systems   Patient denies headache, fevers, malaise, unintentional weight loss, skin rash, eye pain, sinus congestion and sinus pain, sore throat, dysphagia,  hemoptysis , cough, dyspnea, wheezing, chest pain, palpitations, orthopnea, edema, abdominal pain, nausea, melena, diarrhea, constipation, flank pain, dysuria, hematuria, urinary  Frequency, nocturia, numbness, tingling, seizures,  Focal weakness, Loss of consciousness,  Tremor, insomnia, depression, anxiety, and suicidal ideation.      Objective:  BP 120/68 (BP  Location: Left Arm, Patient Position: Sitting, Cuff Size: Normal)   Pulse 66   Temp 98 F (36.7 C) (Oral)   Resp 15   Ht 5\' 3"  (1.6 m)   Wt 161 lb 9.6 oz (73.3 kg)   SpO2 99%   BMI 28.63 kg/m   Physical Exam   General appearance: alert, cooperative and appears stated age Ears: normal TM's and external ear canals both ears Throat: lips, mucosa, and tongue normal; teeth and gums normal Neck: no adenopathy, no carotid bruit, supple, symmetrical, trachea midline and thyroid not enlarged, symmetric, no tenderness/mass/nodules Back: symmetric, no curvature. ROM normal. No CVA tenderness. Lungs: clear to auscultation bilaterally Heart: regular rate and rhythm, S1, S2 normal, no murmur, click, rub or gallop Abdomen: soft, non-tender; bowel sounds normal; no masses,  no organomegaly Pulses: 2+ and symmetric Skin: Skin color, texture, turgor normal. No rashes or lesions Lymph nodes: Cervical, supraclavicular, and axillary nodes normal.    Assessment & Plan:   Problem List Items Addressed This Visit      Unprioritized   Hypothyroidism   Relevant Orders   TSH (Completed)   HLD (hyperlipidemia) - Primary   Relevant Orders   Lipid panel (Completed)   Comprehensive metabolic panel (Completed)  Visit for preventive health examination    age appropriate education and counseling updated, referrals for preventative services and immunizations addressed, dietary and smoking counseling addressed, most recent labs reviewed.  I have personally reviewed and have noted:  1) the patient's medical and social history 2) The pt's use of alcohol, tobacco, and illicit drugs 3) The patient's current medications and supplements 4) Functional ability including ADL's, fall risk, home safety risk, hearing and visual impairment 5) Diet and physical activities 6) Evidence for depression or mood disorder 7) The patient's height, weight, and BMI have been recorded in the chart  I have made referrals, and  provided counseling and education based on review of the above       Other Visit Diagnoses    Anemia, unspecified type       Relevant Orders   CBC with Differential/Platelet (Completed)   IBC + Ferritin   Iron, TIBC and Ferritin Panel (Completed)      I have discontinued Meredeth S. Miley "Raye"'s traMADol and predniSONE. I am also having her maintain her Omega-3 Fatty Acids (OMEGA 3 PO), vitamin B-12, Vitamin D, Biotin, tizanidine, zolpidem, and levothyroxine.  No orders of the defined types were placed in this encounter.   Medications Discontinued During This Encounter  Medication Reason  . predniSONE (DELTASONE) 10 MG tablet Error  . traMADol (ULTRAM) 50 MG tablet Patient has not taken in last 30 days    Follow-up: No follow-ups on file.   Crecencio Mc, MD

## 2019-05-07 NOTE — Patient Instructions (Signed)
I want you to lose 16 lbs this year!  Over the next 6 months ,  Using the  Low glycemic index diet and regular aerobic exercise    Health Maintenance After Age 78 After age 64, you are at a higher risk for certain long-term diseases and infections as well as injuries from falls. Falls are a major cause of broken bones and head injuries in people who are older than age 57. Getting regular preventive care can help to keep you healthy and well. Preventive care includes getting regular testing and making lifestyle changes as recommended by your health care provider. Talk with your health care provider about:  Which screenings and tests you should have. A screening is a test that checks for a disease when you have no symptoms.  A diet and exercise plan that is right for you. What should I know about screenings and tests to prevent falls? Screening and testing are the best ways to find a health problem early. Early diagnosis and treatment give you the best chance of managing medical conditions that are common after age 21. Certain conditions and lifestyle choices may make you more likely to have a fall. Your health care provider may recommend:  Regular vision checks. Poor vision and conditions such as cataracts can make you more likely to have a fall. If you wear glasses, make sure to get your prescription updated if your vision changes.  Medicine review. Work with your health care provider to regularly review all of the medicines you are taking, including over-the-counter medicines. Ask your health care provider about any side effects that may make you more likely to have a fall. Tell your health care provider if any medicines that you take make you feel dizzy or sleepy.  Osteoporosis screening. Osteoporosis is a condition that causes the bones to get weaker. This can make the bones weak and cause them to break more easily.  Blood pressure screening. Blood pressure changes and medicines to control  blood pressure can make you feel dizzy.  Strength and balance checks. Your health care provider may recommend certain tests to check your strength and balance while standing, walking, or changing positions.  Foot health exam. Foot pain and numbness, as well as not wearing proper footwear, can make you more likely to have a fall.  Depression screening. You may be more likely to have a fall if you have a fear of falling, feel emotionally low, or feel unable to do activities that you used to do.  Alcohol use screening. Using too much alcohol can affect your balance and may make you more likely to have a fall. What actions can I take to lower my risk of falls? General instructions  Talk with your health care provider about your risks for falling. Tell your health care provider if: ? You fall. Be sure to tell your health care provider about all falls, even ones that seem minor. ? You feel dizzy, sleepy, or off-balance.  Take over-the-counter and prescription medicines only as told by your health care provider. These include any supplements.  Eat a healthy diet and maintain a healthy weight. A healthy diet includes low-fat dairy products, low-fat (lean) meats, and fiber from whole grains, beans, and lots of fruits and vegetables. Home safety  Remove any tripping hazards, such as rugs, cords, and clutter.  Install safety equipment such as grab bars in bathrooms and safety rails on stairs.  Keep rooms and walkways well-lit. Activity   Follow a regular exercise  program to stay fit. This will help you maintain your balance. Ask your health care provider what types of exercise are appropriate for you.  If you need a cane or walker, use it as recommended by your health care provider.  Wear supportive shoes that have nonskid soles. Lifestyle  Do not drink alcohol if your health care provider tells you not to drink.  If you drink alcohol, limit how much you have: ? 0-1 drink a day for women.  ? 0-2 drinks a day for men.  Be aware of how much alcohol is in your drink. In the [place] one drink equals one typical bottle of beer (12 oz), one-half glass of wine (5 oz), or one shot of hard liquor (1 oz).  Do not use any products that contain nicotine or tobacco, such as cigarettes and e-cigarettes. If you need help quitting, ask your health care provider. Summary  Having a healthy lifestyle and getting preventive care can help to protect your health and wellness after age 27.  Screening and testing are the best way to find a health problem early and help you avoid having a fall. Early diagnosis and treatment give you the best chance for managing medical conditions that are more common for people who are older than age 86.  Falls are a major cause of broken bones and head injuries in people who are older than age 15. Take precautions to prevent a fall at home.  Work with your health care provider to learn what changes you can make to improve your health and wellness and to prevent falls. This information is not intended to replace advice given to you by your health care provider. Make sure you discuss any questions you have with your health care provider. Document Released: 07/30/2017 Document Revised: 01/07/2019 Document Reviewed: 07/30/2017 Elsevier Patient Education  2020 [employer].

## 2019-05-08 LAB — LIPID PANEL
Cholesterol: 209 mg/dL — ABNORMAL HIGH (ref <200)
HDL: 92 mg/dL (ref >=50)
LDL Cholesterol (Calc): 93 mg/dL (calc)
Non-HDL Cholesterol (Calc): 117 mg/dL (calc) (ref <130)
Total CHOL/HDL Ratio: 2.3 (calc) (ref <5.0)
Triglycerides: 140 mg/dL (ref <150)

## 2019-05-08 LAB — CBC WITH DIFFERENTIAL/PLATELET
Absolute Monocytes: 848 cells/uL (ref 200–950)
Basophils Absolute: 34 cells/uL (ref 0–200)
Basophils Relative: 0.4 %
Eosinophils Absolute: 126 cells/uL (ref 15–500)
Eosinophils Relative: 1.5 %
HCT: 37.5 % (ref 35.0–45.0)
Hemoglobin: 12.3 g/dL (ref 11.7–15.5)
Lymphs Abs: 1050 cells/uL (ref 850–3900)
MCH: 29.7 pg (ref 27.0–33.0)
MCHC: 32.8 g/dL (ref 32.0–36.0)
MCV: 90.6 fL (ref 80.0–100.0)
MPV: 10.4 fL (ref 7.5–12.5)
Monocytes Relative: 10.1 %
Neutro Abs: 6342 cells/uL (ref 1500–7800)
Neutrophils Relative %: 75.5 %
Platelets: 373 10*3/uL (ref 140–400)
RBC: 4.14 10*6/uL (ref 3.80–5.10)
RDW: 13.5 % (ref 11.0–15.0)
Total Lymphocyte: 12.5 %
WBC: 8.4 10*3/uL (ref 3.8–10.8)

## 2019-05-08 LAB — IRON,TIBC AND FERRITIN PANEL
%SAT: 12 % (calc) — ABNORMAL LOW (ref 16–45)
Ferritin: 9 ng/mL — ABNORMAL LOW (ref 16–288)
Iron: 47 ug/dL (ref 45–160)
TIBC: 393 mcg/dL (calc) (ref 250–450)

## 2019-05-08 LAB — COMPREHENSIVE METABOLIC PANEL
AG Ratio: 1.8 (calc) (ref 1.0–2.5)
ALT: 17 U/L (ref 6–29)
AST: 18 U/L (ref 10–35)
Albumin: 4.4 g/dL (ref 3.6–5.1)
Alkaline phosphatase (APISO): 80 U/L (ref 37–153)
BUN: 15 mg/dL (ref 7–25)
CO2: 26 mmol/L (ref 20–32)
Calcium: 9.7 mg/dL (ref 8.6–10.4)
Chloride: 103 mmol/L (ref 98–110)
Creat: 0.93 mg/dL (ref 0.60–0.93)
Globulin: 2.4 g/dL (calc) (ref 1.9–3.7)
Glucose, Bld: 93 mg/dL (ref 65–99)
Potassium: 4.4 mmol/L (ref 3.5–5.3)
Sodium: 139 mmol/L (ref 135–146)
Total Bilirubin: 0.4 mg/dL (ref 0.2–1.2)
Total Protein: 6.8 g/dL (ref 6.1–8.1)

## 2019-05-08 LAB — TSH: TSH: 2.11 mIU/L (ref 0.40–4.50)

## 2019-05-09 NOTE — Assessment & Plan Note (Signed)

## 2019-05-10 NOTE — Addendum Note (Signed)
Addended by: Leeanne Rio on: 05/10/2019 02:28 PM   Modules accepted: Orders

## 2019-05-13 ENCOUNTER — Other Ambulatory Visit: Payer: Self-pay | Admitting: Internal Medicine

## 2019-05-13 DIAGNOSIS — E611 Iron deficiency: Secondary | ICD-10-CM

## 2019-05-21 ENCOUNTER — Other Ambulatory Visit: Payer: Self-pay | Admitting: Internal Medicine

## 2019-05-21 NOTE — Telephone Encounter (Signed)
Pt is going out of town and need her meds today. Pt stated her refill was supposed to have gone over to the pharmacy automatically but it didn't. Pt want a call back asap from nurse. Please call pt

## 2019-06-02 ENCOUNTER — Other Ambulatory Visit: Payer: Self-pay

## 2019-06-02 ENCOUNTER — Ambulatory Visit: Payer: Medicare HMO | Admitting: Gastroenterology

## 2019-06-02 VITALS — BP 137/84 | HR 67 | Temp 97.8°F | Ht 63.0 in | Wt 160.2 lb

## 2019-06-02 DIAGNOSIS — M7061 Trochanteric bursitis, right hip: Secondary | ICD-10-CM | POA: Diagnosis not present

## 2019-06-02 DIAGNOSIS — D509 Iron deficiency anemia, unspecified: Secondary | ICD-10-CM | POA: Diagnosis not present

## 2019-06-02 DIAGNOSIS — R030 Elevated blood-pressure reading, without diagnosis of hypertension: Secondary | ICD-10-CM | POA: Diagnosis not present

## 2019-06-02 DIAGNOSIS — M4155 Other secondary scoliosis, thoracolumbar region: Secondary | ICD-10-CM | POA: Diagnosis not present

## 2019-06-02 DIAGNOSIS — Z6827 Body mass index (BMI) 27.0-27.9, adult: Secondary | ICD-10-CM | POA: Diagnosis not present

## 2019-06-02 DIAGNOSIS — M5134 Other intervertebral disc degeneration, thoracic region: Secondary | ICD-10-CM | POA: Diagnosis not present

## 2019-06-02 DIAGNOSIS — M545 Low back pain: Secondary | ICD-10-CM | POA: Diagnosis not present

## 2019-06-02 DIAGNOSIS — M4015 Other secondary kyphosis, thoracolumbar region: Secondary | ICD-10-CM | POA: Diagnosis not present

## 2019-06-02 DIAGNOSIS — M47814 Spondylosis without myelopathy or radiculopathy, thoracic region: Secondary | ICD-10-CM | POA: Diagnosis not present

## 2019-06-02 NOTE — Progress Notes (Signed)
Jonathon Bellows MD, MRCP(U.K) [address]  [place]  [place], [place] [address]  Main: [phone]  Fax: [phone]   Gastroenterology Consultation  Referring Provider:     Crecencio Mc, MD Primary Care Physician:  Crecencio Mc, MD Primary Gastroenterologist:  Dr. Jonathon Bellows  Reason for Consultation:     Iron deficiency anemia        HPI:   Tammy Tate is a 78 y.o. y/o female referred for consultation & management  by Dr. Crecencio Mc, MD.    She has been referred for iron deficiency anemia.  Hemoglobin is 11.5 g when checked 2 months back with an MCV of 95.6.  Ferritin was 9 TIBC of 393.  Repeat hemoglobin 3 weeks back shows a normal hemoglobin 12.3 I do note that she is also on a B12 supplement for possible deficiency. Colonoscopy in 05/19/2014 showed sigmoid diverticulosis and internal hemorrhoids otherwise was normal.  She states that she donated a unit of blood sometime in April and another 1 in May or June of this year.  Subsequently she developed some palpitations and was seen by Dr. Derrel Nip and had a CBC checked and was found to be anemic.  She denies any overt blood loss per vagina per rectum, hematemesis, blood in urine.  She denies any weight loss.  She denies any change in bowel movements.  She has a personal history of breast cancer.  She has been on 1 iron tablet pill a day.  Denies use of any blood thinners.  Past Medical History:  Diagnosis Date  . Allergy   . B12 deficiency   . Bronchitis    hx of recurrent and bronchospasm  . Cancer [facility]) 2010   DCIS , right  . Chicken pox   . Degenerative arthritis of lumbar spine   . Degenerative disk disease    KAM 08-04-2007  . GERD (gastroesophageal reflux disease)   . Migraine with aura   . Thyroid disease   . Vasomotor rhinitis     Past Surgical History:  Procedure Laterality Date  . ABDOMINAL HYSTERECTOMY     with prior HRT  . APPENDECTOMY    . MODIFIED RADICAL MASTECTOMY W/ AXILLARY LYMPH  NODE DISSECTION W/ PECTORALIS MINOR PRESERVATION  2010   bilateral, for DCIS,  [name] , [facility]    Prior to Admission medications   Medication Sig Start Date End Date Taking? Authorizing Provider  Biotin (SUPER BIOTIN) 5 MG TABS Take by mouth.    [provider]  Cholecalciferol (VITAMIN D) 2000 UNITS tablet Take 2,000 Units by mouth daily.    [provider]  levothyroxine (SYNTHROID) 88 MCG tablet TAKE 1 TABLET EVERY DAY ON EMPTY STOMACHWITH A GLASS OF WATER AT LEAST 30-60 MINBEFORE BREAKFAST 05/21/19   Crecencio Mc, MD  Omega-3 Fatty Acids (OMEGA 3 PO) Take by mouth daily.    [provider]  tizanidine (ZANAFLEX) 2 MG capsule Take 1 capsule (2 mg total) by mouth 3 (three) times daily as needed for muscle spasms. Patient not taking: Reported on 05/07/2019 07/16/17   Lyndal Pulley, DO  vitamin B-12 (CYANOCOBALAMIN) 1000 MCG tablet Take 1,000 mcg by mouth daily.    [provider]  zolpidem (AMBIEN) 10 MG tablet TAKE ONE TABLET BY MOUTH AT BEDTIME AS NEEDED 07/03/18   Crecencio Mc, MD    Family History  Problem Relation Age of Onset  . Cancer Mother 47  esophgeal CA mets to lungs, spine   . Stroke Father 61       tobacco abuse, hyperlipidemia  . Hyperlipidemia Father   . Heart disease Father   . Diabetes Maternal Grandfather   . Pancreatitis Daughter      Social History   Tobacco Use  . Smoking status: Former Smoker    Quit date: 10/23/1971    Years since quitting: 47.6  . Smokeless tobacco: Never Used  Substance Use Topics  . Alcohol use: Yes    Alcohol/week: 7.0 standard drinks    Types: 7 Glasses of wine per week    Comment: occasional  . Drug use: No    Allergies as of 06/02/2019  . (No Known Allergies)    Review of Systems:    All systems reviewed and negative except where noted in HPI.   Physical Exam:  There were no vitals taken for this visit. No LMP recorded. Patient has had a hysterectomy. Psych:  Alert and  cooperative. Normal mood and affect. General:   Alert,  Well-developed, well-nourished, pleasant and cooperative in NAD Head:  Normocephalic and atraumatic. Eyes:  Sclera clear, no icterus.   Conjunctiva pink. Ears:  Normal auditory acuity. Nose:  No deformity, discharge, or lesions. Mouth:  No deformity or lesions,oropharynx pink & moist. Neck:  Supple; no masses or thyromegaly. Lungs:  Respirations even and unlabored.  Clear throughout to auscultation.   No wheezes, crackles, or rhonchi. No acute distress. Heart:  Regular rate and rhythm; no murmurs, clicks, rubs, or gallops. Abdomen:  Normal bowel sounds.  No bruits.  Soft, non-tender and non-distended without masses, hepatosplenomegaly or hernias noted.  No guarding or rebound tenderness.    Neurologic:  Alert and oriented x3;  grossly normal neurologically. Skin:  Intact without significant lesions or rashes. No jaundice. Lymph Nodes:  No significant cervical adenopathy. Psych:  Alert and cooperative. Normal mood and affect.  Imaging Studies: No results found.  Assessment and Plan:   Tammy Tate is a 78 y.o. y/o female has been referred for iron deficiency anemia.  Ferritin 9 when checked a few months back.  History of B12 deficiency as well in the past.  She is on a B12 supplement.  Last colonoscopy back in 2015 which was normal.  No overt blood loss.  2 units of blood recently donated.  Possible that the iron deficiency may be secondary to blood donation but in view of her age I would evaluate this further.  Plan 1.  EGD plus colonoscopy. 2.  Continue on iron supplementation and if CBC remains stable will not require capsule study of the small bowel. 3.  H. pylori breath test to be performed for iron deficiency anemia according to new AGA guidelines 2020   I have discussed alternative options, risks & benefits,  which include, but are not limited to, bleeding, infection, perforation,respiratory complication & drug reaction.   The patient agrees with this plan & written consent will be obtained.     Follow up in 3 months  Dr Jonathon Bellows MD,MRCP(U.K)

## 2019-06-03 LAB — H. PYLORI BREATH TEST: H pylori Breath Test: NEGATIVE

## 2019-06-08 ENCOUNTER — Other Ambulatory Visit
Admission: RE | Admit: 2019-06-08 | Discharge: 2019-06-08 | Disposition: A | Discharge status: Home / Self Care | Payer: Medicare HMO | Source: Ambulatory Visit | Attending: Gastroenterology | Admitting: Gastroenterology

## 2019-06-08 ENCOUNTER — Encounter: Payer: Self-pay | Admitting: Gastroenterology

## 2019-06-08 ENCOUNTER — Other Ambulatory Visit: Payer: Self-pay

## 2019-06-08 DIAGNOSIS — Z20828 Contact with and (suspected) exposure to other viral communicable diseases: Secondary | ICD-10-CM | POA: Diagnosis not present

## 2019-06-08 DIAGNOSIS — Z01812 Encounter for preprocedural laboratory examination: Secondary | ICD-10-CM | POA: Diagnosis not present

## 2019-06-08 LAB — SARS CORONAVIRUS 2 (TAT 6-24 HRS): SARS Coronavirus 2: NEGATIVE

## 2019-06-10 ENCOUNTER — Other Ambulatory Visit: Payer: Self-pay

## 2019-06-10 ENCOUNTER — Encounter: Payer: Self-pay | Admitting: Certified Registered Nurse Anesthetist

## 2019-06-10 ENCOUNTER — Ambulatory Visit: Payer: Medicare HMO | Admitting: Certified Registered"

## 2019-06-10 ENCOUNTER — Encounter
Admission: RE | Disposition: A | Discharge status: Home / Self Care | Payer: Self-pay | Source: Home / Self Care | Attending: Gastroenterology

## 2019-06-10 ENCOUNTER — Ambulatory Visit
Admission: RE | Admit: 2019-06-10 | Discharge: 2019-06-10 | Disposition: A | Discharge status: Home / Self Care | Payer: Medicare HMO | Attending: Gastroenterology | Admitting: Gastroenterology

## 2019-06-10 DIAGNOSIS — R51 Headache: Secondary | ICD-10-CM | POA: Diagnosis not present

## 2019-06-10 DIAGNOSIS — K219 Gastro-esophageal reflux disease without esophagitis: Secondary | ICD-10-CM | POA: Diagnosis not present

## 2019-06-10 DIAGNOSIS — D509 Iron deficiency anemia, unspecified: Secondary | ICD-10-CM | POA: Insufficient documentation

## 2019-06-10 DIAGNOSIS — K635 Polyp of colon: Secondary | ICD-10-CM

## 2019-06-10 DIAGNOSIS — Z833 Family history of diabetes mellitus: Secondary | ICD-10-CM | POA: Diagnosis not present

## 2019-06-10 DIAGNOSIS — K229 Disease of esophagus, unspecified: Secondary | ICD-10-CM | POA: Diagnosis not present

## 2019-06-10 DIAGNOSIS — D124 Benign neoplasm of descending colon: Secondary | ICD-10-CM | POA: Diagnosis not present

## 2019-06-10 DIAGNOSIS — Z8541 Personal history of malignant neoplasm of cervix uteri: Secondary | ICD-10-CM | POA: Insufficient documentation

## 2019-06-10 DIAGNOSIS — Z87891 Personal history of nicotine dependence: Secondary | ICD-10-CM | POA: Insufficient documentation

## 2019-06-10 DIAGNOSIS — D125 Benign neoplasm of sigmoid colon: Secondary | ICD-10-CM | POA: Diagnosis not present

## 2019-06-10 DIAGNOSIS — Z8249 Family history of ischemic heart disease and other diseases of the circulatory system: Secondary | ICD-10-CM | POA: Insufficient documentation

## 2019-06-10 DIAGNOSIS — D123 Benign neoplasm of transverse colon: Secondary | ICD-10-CM | POA: Diagnosis not present

## 2019-06-10 DIAGNOSIS — B3781 Candidal esophagitis: Secondary | ICD-10-CM | POA: Diagnosis not present

## 2019-06-10 DIAGNOSIS — E538 Deficiency of other specified B group vitamins: Secondary | ICD-10-CM | POA: Insufficient documentation

## 2019-06-10 DIAGNOSIS — Z9013 Acquired absence of bilateral breasts and nipples: Secondary | ICD-10-CM | POA: Diagnosis not present

## 2019-06-10 DIAGNOSIS — Z823 Family history of stroke: Secondary | ICD-10-CM | POA: Diagnosis not present

## 2019-06-10 DIAGNOSIS — Z90710 Acquired absence of both cervix and uterus: Secondary | ICD-10-CM | POA: Insufficient documentation

## 2019-06-10 DIAGNOSIS — D126 Benign neoplasm of colon, unspecified: Secondary | ICD-10-CM | POA: Diagnosis not present

## 2019-06-10 DIAGNOSIS — Z800 Family history of malignant neoplasm of digestive organs: Secondary | ICD-10-CM | POA: Diagnosis not present

## 2019-06-10 DIAGNOSIS — E079 Disorder of thyroid, unspecified: Secondary | ICD-10-CM | POA: Diagnosis not present

## 2019-06-10 DIAGNOSIS — Z8379 Family history of other diseases of the digestive system: Secondary | ICD-10-CM | POA: Diagnosis not present

## 2019-06-10 DIAGNOSIS — K5790 Diverticulosis of intestine, part unspecified, without perforation or abscess without bleeding: Secondary | ICD-10-CM | POA: Diagnosis not present

## 2019-06-10 DIAGNOSIS — Z79899 Other long term (current) drug therapy: Secondary | ICD-10-CM | POA: Insufficient documentation

## 2019-06-10 HISTORY — PX: COLONOSCOPY WITH PROPOFOL: SHX5780

## 2019-06-10 HISTORY — PX: ESOPHAGOGASTRODUODENOSCOPY (EGD) WITH PROPOFOL: SHX5813

## 2019-06-10 LAB — KOH PREP

## 2019-06-10 SURGERY — COLONOSCOPY WITH PROPOFOL
Anesthesia: General

## 2019-06-10 MED ORDER — PROPOFOL 500 MG/50ML IV EMUL
INTRAVENOUS | Status: DC | PRN
  Administered 2019-06-10: 130 ug/kg/min via INTRAVENOUS

## 2019-06-10 MED ORDER — PROPOFOL 500 MG/50ML IV EMUL
INTRAVENOUS | Status: AC
  Filled 2019-06-10: qty 50

## 2019-06-10 MED ORDER — SODIUM CHLORIDE 0.9 % IV SOLN
INTRAVENOUS | Status: DC
  Administered 2019-06-10: 1000 mL via INTRAVENOUS

## 2019-06-10 MED ORDER — LIDOCAINE HCL (CARDIAC) PF 100 MG/5ML IV SOSY
PREFILLED_SYRINGE | INTRAVENOUS | Status: DC | PRN
  Administered 2019-06-10: 50 mg via INTRAVENOUS

## 2019-06-10 MED ORDER — LIDOCAINE HCL (PF) 2 % IJ SOLN
INTRAMUSCULAR | Status: AC
  Filled 2019-06-10: qty 10

## 2019-06-10 MED ORDER — GLYCOPYRROLATE 0.2 MG/ML IJ SOLN
INTRAMUSCULAR | Status: DC | PRN
  Administered 2019-06-10: 0.2 mg via INTRAVENOUS

## 2019-06-10 MED ORDER — PROPOFOL 10 MG/ML IV BOLUS
INTRAVENOUS | Status: DC | PRN
  Administered 2019-06-10: 90 mg via INTRAVENOUS

## 2019-06-10 NOTE — Anesthesia Post-op Follow-up Note (Signed)
Anesthesia QCDR form completed.        

## 2019-06-10 NOTE — H&P (Signed)
Jonathon Bellows, MD [address] [place], [place], [place], [address] [address], [place], [place], [place], [address] Phone: [phone]  Fax: [phone]  Primary Care Physician:  Crecencio Mc, MD   Pre-Procedure History & Physical: HPI:  Tammy Tate is a 78 y.o. female is here for an endoscopy and colonoscopy    Past Medical History:  Diagnosis Date  . Allergy   . B12 deficiency   . Bronchitis    hx of recurrent and bronchospasm  . Cancer [facility]) 2010   DCIS , right  . Chicken pox   . Degenerative arthritis of lumbar spine   . Degenerative disk disease    KAM 08-04-2007  . GERD (gastroesophageal reflux disease)   . Migraine with aura   . Thyroid disease   . Vasomotor rhinitis     Past Surgical History:  Procedure Laterality Date  . ABDOMINAL HYSTERECTOMY     with prior HRT  . APPENDECTOMY    . MODIFIED RADICAL MASTECTOMY W/ AXILLARY LYMPH NODE DISSECTION W/ PECTORALIS MINOR PRESERVATION  2010   bilateral, for DCIS,  [name] , [facility]    Prior to Admission medications   Medication Sig Start Date End Date Taking? Authorizing Provider  Biotin (SUPER BIOTIN) 5 MG TABS Take by mouth.   Yes [provider]  Cholecalciferol (VITAMIN D) 2000 UNITS tablet Take 2,000 Units by mouth daily.   Yes [provider]  Ferrous Sulfate (IRON) 325 (65 Fe) MG TABS  05/10/19  Yes [provider]  levothyroxine (SYNTHROID) 88 MCG tablet TAKE 1 TABLET EVERY DAY ON EMPTY STOMACHWITH A GLASS OF WATER AT LEAST 30-60 MINBEFORE BREAKFAST 05/21/19  Yes Crecencio Mc, MD  Multiple Vitamins-Minerals (PRESERVISION AREDS 2 PO)  11/02/18  Yes [provider]  Omega-3 Fatty Acids (OMEGA 3 PO) Take by mouth daily.   Yes [provider]  tizanidine (ZANAFLEX) 2 MG capsule Take 1 capsule (2 mg total) by mouth 3 (three) times daily as needed for muscle spasms. 07/16/17  Yes Lyndal Pulley, DO  vitamin B-12 (CYANOCOBALAMIN) 1000 MCG tablet Take  1,000 mcg by mouth daily.   Yes [provider]  zolpidem (AMBIEN) 10 MG tablet TAKE ONE TABLET BY MOUTH AT BEDTIME AS NEEDED 07/03/18  Yes Crecencio Mc, MD    Allergies as of 06/02/2019  . (No Known Allergies)    Family History  Problem Relation Age of Onset  . Cancer Mother 30       esophgeal CA mets to lungs, spine   . Stroke Father 72       tobacco abuse, hyperlipidemia  . Hyperlipidemia Father   . Heart disease Father   . Diabetes Maternal Grandfather   . Pancreatitis Daughter     Social History   Socioeconomic History  . Marital status: Married    Spouse name: Not on file  . Number of children: Not on file  . Years of education: Not on file  . Highest education level: Not on file  Occupational History  . Not on file  Social Needs  . Financial resource strain: Not hard at all  . Food insecurity    Worry: Never true    Inability: Never true  . Transportation needs    Medical: No    Non-medical: No  Tobacco Use  . Smoking status: Former Smoker    Quit date: 10/23/1971    Years since quitting: 47.6  . Smokeless tobacco: Never Used  Substance and Sexual Activity  . Alcohol use: Yes    Alcohol/week: 7.0 standard drinks    Types: 7 Glasses of wine per week    Comment: occasional  . Drug use: No  . Sexual activity: Not Currently  Lifestyle  . Physical activity    Days per week: 2 days    Minutes per session: 20 min  . Stress: Not at all  Relationships  . Social Herbalist on phone: Not on file    Gets together: Not on file    Attends religious service: Not on file    Active member of club or organization: Not on file    Attends meetings of clubs or organizations: Not on file    Relationship status: Married  . Intimate partner violence    Fear of current or ex partner: No    Emotionally abused: No    Physically abused: No    Forced sexual activity: No  Other Topics Concern  . Not on file  Social History Narrative  . Not on file     Review of Systems: See HPI, otherwise negative ROS  Physical Exam: BP 124/68   Pulse (!) 59   Temp (!) 97.2 F (36.2 C) (Tympanic)   Resp 20   Ht 5\' 4"  (1.626 m)   Wt 72.6 kg   SpO2 100%   BMI 27.46 kg/m  General:   Alert,  pleasant and cooperative in NAD Head:  Normocephalic and atraumatic. Neck:  Supple; no masses or thyromegaly. Lungs:  Clear throughout to auscultation, normal respiratory effort.    Heart:  +S1, +S2, Regular rate and rhythm, No edema. Abdomen:  Soft, nontender and nondistended. Normal bowel sounds, without guarding, and without rebound.   Neurologic:  Alert and  oriented x4;  grossly normal neurologically.  Impression/Plan: Tammy Tate is here for an endoscopy and colonoscopy  to be performed for  evaluation of iron deficiency anemia    Risks, benefits, limitations, and alternatives regarding endoscopy have been reviewed with the patient.  Questions have been answered.  All parties agreeable.   Jonathon Bellows, MD  06/10/2019, 10:12 AM

## 2019-06-10 NOTE — Transfer of Care (Signed)
Immediate Anesthesia Transfer of Care Note  Patient: Tammy Tate  Procedure(s) Performed: COLONOSCOPY WITH PROPOFOL (N/A ) ESOPHAGOGASTRODUODENOSCOPY (EGD) WITH PROPOFOL (N/A )  Patient Location: PACU and Endoscopy Unit  Anesthesia Type:General  Level of Consciousness: awake, alert , oriented and patient cooperative  Airway & Oxygen Therapy: Patient Spontanous Breathing  Post-op Assessment: Report given to RN and Post -op Vital signs reviewed and stable  Post vital signs: Reviewed and stable  Last Vitals:  Vitals Value Taken Time  BP 98/65 06/10/19 1108  Temp 36.1 C 06/10/19 1108  Pulse 90 06/10/19 1108  Resp 15 06/10/19 1108  SpO2 98 % 06/10/19 1108  Vitals shown include unvalidated device data.  Last Pain:  Vitals:   06/10/19 1108  TempSrc: Tympanic  PainSc:          Complications: No apparent anesthesia complications

## 2019-06-10 NOTE — Op Note (Signed)
[facility] Gastroenterology Patient Name: Tammy Tate Procedure Date: 06/10/2019 10:27 AM MRN: [id number] Account #: [id number] Date of Birth: April 14, 1941 Admit Type: Outpatient Age: 78 Room: [facility] ENDO ROOM 4 Gender: Female Note Status: Finalized Procedure:            Upper GI endoscopy Indications:          Iron deficiency anemia Providers:            Jonathon Bellows MD, MD Referring MD:         Deborra Medina, MD (Referring MD) Medicines:            Monitored Anesthesia Care Complications:        No immediate complications. Procedure:            Pre-Anesthesia Assessment:                       - Prior to the procedure, a History and Physical was                        performed, and patient medications, allergies and                        sensitivities were reviewed. The patient's tolerance of                        previous anesthesia was reviewed.                       - The risks and benefits of the procedure and the                        sedation options and risks were discussed with the                        patient. All questions were answered and informed                        consent was obtained.                       - ASA Grade Assessment: II - A patient with mild                        systemic disease.                       After obtaining informed consent, the endoscope was                        passed under direct vision. Throughout the procedure,                        the patient's blood pressure, pulse, and oxygen                        saturations were monitored continuously. The Endoscope                        was introduced through the mouth, and advanced to the  third part of duodenum. The upper GI endoscopy was                        accomplished without difficulty. The patient tolerated                        the procedure well. Findings:      The examined duodenum was normal. Biopsies for histology were taken  with       a cold forceps for evaluation of celiac disease.      The stomach was normal.      Diffuse, white plaques were found in the entire esophagus. Brushings for       KOH prep were obtained in the entire esophagus.      The cardia and gastric fundus were normal on retroflexion. Impression:           - Normal examined duodenum. Biopsied.                       - Normal stomach.                       - Esophageal plaques were found, suspicious for                        candidiasis. Brushings performed. Recommendation:       - Await pathology results.                       - Perform a colonoscopy today. Procedure Code(s):    --- Professional ---                       [phone], Esophagogastroduodenoscopy, flexible, transoral;                        with biopsy, single or multiple Diagnosis Code(s):    --- Professional ---                       K22.9, Disease of esophagus, unspecified                       D50.9, Iron deficiency anemia, unspecified CPT copyright 2019 American Medical Association. All rights reserved. The codes documented in this report are preliminary and upon coder review may  be revised to meet current compliance requirements. Jonathon Bellows, MD Jonathon Bellows MD, MD 06/10/2019 10:40:06 AM This report has been signed electronically. Number of Addenda: 0 Note Initiated On: 06/10/2019 10:27 AM Estimated Blood Loss: Estimated blood loss: none.      [facility]

## 2019-06-10 NOTE — Anesthesia Preprocedure Evaluation (Signed)
Anesthesia Evaluation  Patient identified by MRN, date of birth, ID band Patient awake    Reviewed: Allergy & Precautions, H&P , NPO status , Patient's Chart, lab work & pertinent test results  Airway Mallampati: II  TM Distance: >3 FB     Dental  (+) Teeth Intact   Pulmonary neg COPD, former smoker,           Cardiovascular (-) angina(-) Past MI and (-) Cardiac Stents negative cardio ROS  (-) dysrhythmias      Neuro/Psych  Headaches, negative psych ROS   GI/Hepatic Neg liver ROS, GERD  Controlled,  Endo/Other  Hypothyroidism   Renal/GU negative Renal ROS  negative genitourinary   Musculoskeletal   Abdominal   Peds  Hematology negative hematology ROS (+)   Anesthesia Other Findings Past Medical History: No date: Allergy No date: B12 deficiency No date: Bronchitis     Comment:  hx of recurrent and bronchospasm 2010: Cancer ([place])     Comment:  DCIS , right No date: Chicken pox No date: Degenerative arthritis of lumbar spine No date: Degenerative disk disease     Comment:  KAM 08-04-2007 No date: GERD (gastroesophageal reflux disease) No date: Migraine with aura No date: Thyroid disease No date: Vasomotor rhinitis  Past Surgical History: No date: ABDOMINAL HYSTERECTOMY     Comment:  with prior HRT No date: APPENDECTOMY 2010: MODIFIED RADICAL MASTECTOMY W/ AXILLARY LYMPH NODE DISSECTION W/  PECTORALIS MINOR PRESERVATION     Comment:  bilateral, for DCIS,  [name] , [facility]  BMI    Body Mass Index: 27.46 kg/m      Reproductive/Obstetrics negative OB ROS                             Anesthesia Physical Anesthesia Plan  ASA: II  Anesthesia Plan: General   Post-op Pain Management:    Induction:   PONV Risk Score and Plan: Propofol infusion and TIVA  Airway Management Planned:   Additional Equipment:   Intra-op Plan:   Post-operative Plan:   Informed Consent: I have  reviewed the patients History and Physical, chart, labs and discussed the procedure including the risks, benefits and alternatives for the proposed anesthesia with the patient or authorized representative who has indicated his/her understanding and acceptance.     Dental Advisory Given  Plan Discussed with: Anesthesiologist and CRNA  Anesthesia Plan Comments:         Anesthesia Quick Evaluation

## 2019-06-10 NOTE — Anesthesia Postprocedure Evaluation (Signed)
Anesthesia Post Note  Patient: Tammy Tate  Procedure(s) Performed: COLONOSCOPY WITH PROPOFOL (N/A ) ESOPHAGOGASTRODUODENOSCOPY (EGD) WITH PROPOFOL (N/A )  Patient location during evaluation: PACU Anesthesia Type: General Level of consciousness: awake and alert Pain management: pain level controlled Vital Signs Assessment: post-procedure vital signs reviewed and stable Respiratory status: spontaneous breathing, nonlabored ventilation and respiratory function stable Cardiovascular status: blood pressure returned to baseline and stable Postop Assessment: no apparent nausea or vomiting Anesthetic complications: no     Last Vitals:  Vitals:   06/10/19 1108 06/10/19 1137  BP: 98/65 118/87  Pulse:    Resp:    Temp: (!) 36.1 C   SpO2:      Last Pain:  Vitals:   06/10/19 1128  TempSrc:   PainSc: 0-No pain                 Durenda Hurt

## 2019-06-10 NOTE — Progress Notes (Signed)
Inform that she had some yeastlike infection in the esophagus we took some brushings it has come back positive for yeast.  Commence on Diflucan 200 mg daily for 14 days

## 2019-06-10 NOTE — Op Note (Signed)
[facility] Gastroenterology Patient Name: Tammy Tate Procedure Date: 06/10/2019 10:26 AM MRN: [id number] Account #: [id number] Date of Birth: 1940/11/13 Admit Type: Outpatient Age: 78 Room: [facility] ENDO ROOM 4 Gender: Female Note Status: Finalized Procedure:            Colonoscopy Indications:          Iron deficiency anemia Providers:            Jonathon Bellows MD, MD Referring MD:         Deborra Medina, MD (Referring MD) Medicines:            Monitored Anesthesia Care Complications:        No immediate complications. Procedure:            Pre-Anesthesia Assessment:                       - Prior to the procedure, a History and Physical was                        performed, and patient medications, allergies and                        sensitivities were reviewed. The patient's tolerance of                        previous anesthesia was reviewed.                       - The risks and benefits of the procedure and the                        sedation options and risks were discussed with the                        patient. All questions were answered and informed                        consent was obtained.                       - ASA Grade Assessment: II - A patient with mild                        systemic disease.                       After obtaining informed consent, the colonoscope was                        passed under direct vision. Throughout the procedure,                        the patient's blood pressure, pulse, and oxygen                        saturations were monitored continuously. The                        Colonoscope was introduced through the anus and                        advanced  to the the cecum, identified by the                        appendiceal orifice. The colonoscopy was performed with                        ease. The patient tolerated the procedure well. The                        quality of the bowel preparation was good. Findings:    The perianal and digital rectal examinations were normal.      Two sessile polyps were found in the transverse colon. The polyps were 5       to 7 mm in size. These polyps were removed with a cold snare. Resection       and retrieval were complete.      Three sessile polyps were found in the descending colon. The polyps were       4 to 6 mm in size. These polyps were removed with a cold snare.       Resection and retrieval were complete.      A 5 mm polyp was found in the sigmoid colon. The polyp was sessile. The       polyp was removed with a cold snare. Resection and retrieval were       complete.      Multiple small-mouthed diverticula were found in the entire colon. Impression:           - Two 5 to 7 mm polyps in the transverse colon, removed                        with a cold snare. Resected and retrieved.                       - Three 4 to 6 mm polyps in the descending colon,                        removed with a cold snare. Resected and retrieved.                       - One 5 mm polyp in the sigmoid colon, removed with a                        cold snare. Resected and retrieved. Recommendation:       - Discharge patient to home (with escort).                       - Resume previous diet.                       - Continue present medications.                       - Await pathology results.                       - Suggest monitor CBC if stable no further evaluation                        if drops then will need capsule study of the small bowel  Procedure Code(s):    --- Professional ---                       [phone], Colonoscopy, flexible; with removal of tumor(s),                        polyp(s), or other lesion(s) by snare technique Diagnosis Code(s):    --- Professional ---                       K63.5, Polyp of colon                       D50.9, Iron deficiency anemia, unspecified CPT copyright 2019 American Medical Association. All rights reserved. The codes documented in this  report are preliminary and upon coder review may  be revised to meet current compliance requirements. Jonathon Bellows, MD Jonathon Bellows MD, MD 06/10/2019 11:05:56 AM This report has been signed electronically. Number of Addenda: 0 Note Initiated On: 06/10/2019 10:26 AM Scope Withdrawal Time: 0 hours 15 minutes 18 seconds  Total Procedure Duration: 0 hours 20 minutes 57 seconds  Estimated Blood Loss: Estimated blood loss: none.      [facility]

## 2019-06-11 ENCOUNTER — Encounter: Payer: Self-pay | Admitting: Gastroenterology

## 2019-06-14 LAB — SURGICAL PATHOLOGY

## 2019-06-15 ENCOUNTER — Other Ambulatory Visit: Payer: Self-pay

## 2019-06-15 ENCOUNTER — Telehealth: Payer: Self-pay

## 2019-06-15 MED ORDER — FLUCONAZOLE 200 MG PO TABS: 200.0000 mg | ORAL_TABLET | Freq: Every day | ORAL | 0 refills | Status: AC

## 2019-06-15 NOTE — Telephone Encounter (Signed)
-----   Message from Jonathon Bellows, MD sent at 06/10/2019 12:41 PM EDT ----- Inform that she had some yeastlike infection in the esophagus we took some brushings it has come back positive for yeast.  Commence on Diflucan 200 mg daily for 14 days

## 2019-06-15 NOTE — Telephone Encounter (Signed)
Spoke with Tammy Tate and informed her of endoscopy findings and Dr. Georgeann Oppenheim directions to commence on Diflucan for treatment. Tammy Tate agrees and states she plans to start the medication when she returns from her vacation this week. Script has been sent to Tammy Tate preferred pharmacy.

## 2019-06-21 ENCOUNTER — Ambulatory Visit (INDEPENDENT_AMBULATORY_CARE_PROVIDER_SITE_OTHER): Payer: Medicare HMO | Admitting: Internal Medicine

## 2019-06-21 ENCOUNTER — Encounter: Payer: Self-pay | Admitting: Internal Medicine

## 2019-06-21 VITALS — BP 115/72 | Ht 63.0 in | Wt 154.0 lb

## 2019-06-21 DIAGNOSIS — J301 Allergic rhinitis due to pollen: Secondary | ICD-10-CM

## 2019-06-21 DIAGNOSIS — E034 Atrophy of thyroid (acquired): Secondary | ICD-10-CM | POA: Diagnosis not present

## 2019-06-21 DIAGNOSIS — B3781 Candidal esophagitis: Secondary | ICD-10-CM

## 2019-06-21 DIAGNOSIS — M48061 Spinal stenosis, lumbar region without neurogenic claudication: Secondary | ICD-10-CM | POA: Diagnosis not present

## 2019-06-21 DIAGNOSIS — E7800 Pure hypercholesterolemia, unspecified: Secondary | ICD-10-CM

## 2019-06-21 DIAGNOSIS — D500 Iron deficiency anemia secondary to blood loss (chronic): Secondary | ICD-10-CM | POA: Diagnosis not present

## 2019-06-21 MED ORDER — MONTELUKAST SODIUM 10 MG PO TABS: 10.0000 mg | ORAL_TABLET | Freq: Every day | ORAL | 3 refills | Status: DC

## 2019-06-21 MED ORDER — ATROVENT HFA 17 MCG/ACT IN AERS: 2.0000 | INHALATION_SPRAY | Freq: Four times a day (QID) | RESPIRATORY_TRACT | 12 refills | Status: DC | PRN

## 2019-06-21 NOTE — Assessment & Plan Note (Signed)
No surgical intervention per Dr Sherwood Gambler.  Symptoms controlled with stretching,  Prn aleve,

## 2019-06-21 NOTE — Patient Instructions (Signed)
The prednisone has caused your yeast infection of the esophagus    2 new medications for your rhinitis:  atrovent nasal spray 2 puffs up to 4 times daily (NOT A STEROID)  sINGULAIR  (mast cell inhibitor, blocks the release of histamine)   Continue iron   repet labs 1 month   You can add up to 2000 mg of acetominophen (tylenol) every day safely  In divided doses (500 mg every 6 hours  Or 1000 mg every 12 hours.)  If you start taking aleve or motrin daily,  You will need GI protection with a PPI (Prilosec, Nexium, etc)

## 2019-06-21 NOTE — Progress Notes (Signed)
Virtual Visit via [email or web address]  This visit type was conducted due to national recommendations for restrictions regarding the COVID-19 pandemic (e.g. social distancing).  This format is felt to be most appropriate for this patient at this time.  All issues noted in this document were discussed and addressed.  No physical exam was performed (except for noted visual exam findings with Video Visits).   I connected with@ on 06/21/19 at  9:30 AM EDT by a video enabled telemedicine application  and verified that I am speaking with the correct person using two identifiers. Location patient: home Location provider: work or home office Persons participating in the virtual visit: patient, provider  I discussed the limitations, risks, security and privacy concerns of performing an evaluation and management service by telephone and the availability of in person appointments. I also discussed with the patient that there may be a patient responsible charge related to this service. The patient expressed understanding and agreed to proceed.   Reason for visit: follow up on multiple issues, including  recent testing by GI for IDA   HPI:   78 yr old female recently underwent GI eval for IDA with EGD/colonoscopy  Tubular adenomas, at least 5  Candida in esophagus  Found.    Has had a chronic cough , last chest x ray was normal in dec 2017  Drainage,  Recurrent  Sneezing.  Aggravated by temp changes and weather changes. Has had prednisone trials  several times per year, also for the back pain . No prior trial of atrovent or singulair.    Spinal stenosis: saw [facility]elman sept 2  :  Not symptomatic enough to warrant surgery which would be extensive  bc of concurrent scoliosis.  Pain is tolerable with aleve and has not tried adding tylenol  Gabapentin was not effective at tolerated doses   Right hip was injected for bursitis , currently pain free.   Still taking iron 65 mg for IDA    ROS: See pertinent positives and  negatives per HPI.  Past Medical History:  Diagnosis Date  . Allergy   . B12 deficiency   . Bronchitis    hx of recurrent and bronchospasm  . Cancer [facility]) 2010   DCIS , right  . Chicken pox   . Degenerative arthritis of lumbar spine   . Degenerative disk disease    KAM 08-04-2007  . GERD (gastroesophageal reflux disease)   . Migraine with aura   . Thyroid disease   . Vasomotor rhinitis     Past Surgical History:  Procedure Laterality Date  . ABDOMINAL HYSTERECTOMY     with prior HRT  . APPENDECTOMY    . COLONOSCOPY WITH PROPOFOL N/A 06/10/2019   Procedure: COLONOSCOPY WITH PROPOFOL;  Surgeon: Jonathon Bellows, MD;  Location: [facility] ENDOSCOPY;  Service: Gastroenterology;  Laterality: N/A;  . ESOPHAGOGASTRODUODENOSCOPY (EGD) WITH PROPOFOL N/A 06/10/2019   Procedure: ESOPHAGOGASTRODUODENOSCOPY (EGD) WITH PROPOFOL;  Surgeon: Jonathon Bellows, MD;  Location: [facility] ENDOSCOPY;  Service: Gastroenterology;  Laterality: N/A;  . MODIFIED RADICAL MASTECTOMY W/ AXILLARY LYMPH NODE DISSECTION W/ PECTORALIS MINOR PRESERVATION  2010   bilateral, for DCIS,  [name] , [facility]    Family History  Problem Relation Age of Onset  . Cancer Mother 30       esophgeal CA mets to lungs, spine   . Stroke Father 75       tobacco abuse, hyperlipidemia  . Hyperlipidemia Father   . Heart disease Father   . Diabetes Maternal Grandfather   .  Pancreatitis Daughter     SOCIAL HX:  reports that she quit smoking about 47 years ago. She has never used smokeless tobacco. She reports current alcohol use of about 7.0 standard drinks of alcohol per week. She reports that she does not use drugs.   Current Outpatient Medications:  .  Biotin (SUPER BIOTIN) 5 MG TABS, Take by mouth., Disp: , Rfl:  .  Ferrous Sulfate (IRON) 325 (65 Fe) MG TABS, , Disp: , Rfl:  .  fluconazole (DIFLUCAN) 200 MG tablet, Take 1 tablet (200 mg total) by mouth daily for 14 days., Disp: 14 tablet, Rfl: 0 .  levothyroxine (SYNTHROID) 88 MCG tablet, TAKE 1  TABLET EVERY DAY ON EMPTY STOMACHWITH A GLASS OF WATER AT LEAST 30-60 MINBEFORE BREAKFAST, Disp: 90 tablet, Rfl: 0 .  Multiple Vitamins-Minerals (PRESERVISION AREDS 2 PO), , Disp: , Rfl:  .  Omega-3 Fatty Acids (OMEGA 3 PO), Take by mouth daily., Disp: , Rfl:  .  tizanidine (ZANAFLEX) 2 MG capsule, Take 1 capsule (2 mg total) by mouth 3 (three) times daily as needed for muscle spasms., Disp: 30 capsule, Rfl: 3 .  Turmeric (QC TUMERIC COMPLEX) 500 MG CAPS, Take 2 capsules by mouth daily. , Disp: , Rfl:  .  vitamin B-12 (CYANOCOBALAMIN) 1000 MCG tablet, Take 3,000 mcg by mouth daily. , Disp: , Rfl:  .  zolpidem (AMBIEN) 10 MG tablet, TAKE ONE TABLET BY MOUTH AT BEDTIME AS NEEDED, Disp: 30 tablet, Rfl: 2 .  ipratropium (ATROVENT HFA) 17 MCG/ACT inhaler, Inhale 2 puffs into the lungs every 6 (six) hours as needed for wheezing., Disp: 1 Inhaler, Rfl: 12 .  montelukast (SINGULAIR) 10 MG tablet, Take 1 tablet (10 mg total) by mouth at bedtime., Disp: 30 tablet, Rfl: 3  EXAM:  VITALS per patient if applicable:  GENERAL: alert, oriented, appears well and in no acute distress  HEENT: atraumatic, conjunttiva clear, no obvious abnormalities on inspection of external nose and ears  NECK: normal movements of the head and neck  LUNGS: on inspection no signs of respiratory distress, breathing rate appears normal, no obvious gross SOB, gasping or wheezing  CV: no obvious cyanosis  MS: moves all visible extremities without noticeable abnormality  PSYCH/NEURO: pleasant and cooperative, no obvious depression or anxiety, speech and thought processing grossly intact  ASSESSMENT AND PLAN:  Discussed the following assessment and plan:  Candida esophagitis (HCC)  Hypothyroidism due to acquired atrophy of thyroid  Pure hypercholesterolemia - Plan: Comprehensive metabolic panel  Iron deficiency anemia due to chronic blood loss - Plan: IBC + Ferritin, CBC with Differential/Platelet  Spinal stenosis of  lumbar region without neurogenic claudication  Non-seasonal allergic rhinitis due to pollen  Lumbar spinal stenosis No surgical intervention per Dr Sherwood Gambler.  Symptoms controlled with stretching,  Prn aleve,    Candida esophagitis (HCC) secondary to recurrent prednisone tapers used to manage recurrent sinusitis/bronchitis.  Fluconazole prescribed by GI.  Will use 3 day  Course whenever prescribing prednisone in the future   Iron deficiency anemia No source in colon or esophagus found.  secondary to blood donation.  Continue iron daily ofr one month,  Recheck.   Allergic rhinitis Patient now willing to try Singulair and Atrovent.     I discussed the assessment and treatment plan with the patient. The patient was provided an opportunity to ask questions and all were answered. The patient agreed with the plan and demonstrated an understanding of the instructions.   The patient was advised to call back  or seek an in-person evaluation if the symptoms worsen or if the condition fails to improve as anticipated.  I provided  25 minutes of non-face-to-face time during this encounter reviewing patient's current problems and post surgeries.  Providing counseling on the above mentioned problems , and coordination  of care .   Crecencio Mc, MD

## 2019-06-21 NOTE — Assessment & Plan Note (Signed)
secondary to recurrent prednisone tapers used to manage recurrent sinusitis/bronchitis.  Fluconazole prescribed by GI.  Will use 3 day  Course whenever prescribing prednisone in the future

## 2019-06-21 NOTE — Assessment & Plan Note (Signed)
Patient now willing to try Singulair and Atrovent.

## 2019-06-21 NOTE — Assessment & Plan Note (Signed)
No source in colon or esophagus found.  secondary to blood donation.  Continue iron daily ofr one month,  Recheck.

## 2019-07-02 ENCOUNTER — Other Ambulatory Visit: Payer: Self-pay

## 2019-07-02 ENCOUNTER — Ambulatory Visit (INDEPENDENT_AMBULATORY_CARE_PROVIDER_SITE_OTHER): Payer: Medicare HMO

## 2019-07-02 DIAGNOSIS — Z23 Encounter for immunization: Secondary | ICD-10-CM | POA: Diagnosis not present

## 2019-07-21 ENCOUNTER — Other Ambulatory Visit: Payer: Self-pay

## 2019-07-21 ENCOUNTER — Other Ambulatory Visit (INDEPENDENT_AMBULATORY_CARE_PROVIDER_SITE_OTHER): Payer: Medicare HMO

## 2019-07-21 DIAGNOSIS — D500 Iron deficiency anemia secondary to blood loss (chronic): Secondary | ICD-10-CM | POA: Diagnosis not present

## 2019-07-21 DIAGNOSIS — E7800 Pure hypercholesterolemia, unspecified: Secondary | ICD-10-CM

## 2019-07-21 LAB — IBC + FERRITIN
Ferritin: 28 ng/mL (ref 10.0–291.0)
Iron: 121 ug/dL (ref 42–145)
Saturation Ratios: 38.8 % (ref 20.0–50.0)
Transferrin: 223 mg/dL (ref 212.0–360.0)

## 2019-07-21 LAB — CBC WITH DIFFERENTIAL/PLATELET
Basophils Absolute: 0.1 10*3/uL (ref 0.0–0.1)
Basophils Relative: 0.9 % (ref 0.0–3.0)
Eosinophils Absolute: 0.2 10*3/uL (ref 0.0–0.7)
Eosinophils Relative: 2.9 % (ref 0.0–5.0)
HCT: 41.2 % (ref 36.0–46.0)
Hemoglobin: 13.9 g/dL (ref 12.0–15.0)
Lymphocytes Relative: 18.8 % (ref 12.0–46.0)
Lymphs Abs: 1.2 10*3/uL (ref 0.7–4.0)
MCHC: 33.8 g/dL (ref 30.0–36.0)
MCV: 98.9 fl (ref 78.0–100.0)
Monocytes Absolute: 0.8 10*3/uL (ref 0.1–1.0)
Monocytes Relative: 12.6 % — ABNORMAL HIGH (ref 3.0–12.0)
Neutro Abs: 4.2 10*3/uL (ref 1.4–7.7)
Neutrophils Relative %: 64.8 % (ref 43.0–77.0)
Platelets: 313 10*3/uL (ref 150.0–400.0)
RBC: 4.16 Mil/uL (ref 3.87–5.11)
RDW: 18.9 % — ABNORMAL HIGH (ref 11.5–15.5)
WBC: 6.5 10*3/uL (ref 4.0–10.5)

## 2019-07-21 LAB — COMPREHENSIVE METABOLIC PANEL
ALT: 15 U/L (ref 0–35)
AST: 16 U/L (ref 0–37)
Albumin: 4 g/dL (ref 3.5–5.2)
Alkaline Phosphatase: 62 U/L (ref 39–117)
BUN: 16 mg/dL (ref 6–23)
CO2: 30 mEq/L (ref 19–32)
Calcium: 9 mg/dL (ref 8.4–10.5)
Chloride: 104 mEq/L (ref 96–112)
Creatinine, Ser: 0.83 mg/dL (ref 0.40–1.20)
GFR: 66.52 mL/min (ref >60.00)
Glucose, Bld: 82 mg/dL (ref 70–99)
Potassium: 4 mEq/L (ref 3.5–5.1)
Sodium: 140 mEq/L (ref 135–145)
Total Bilirubin: 0.6 mg/dL (ref 0.2–1.2)
Total Protein: 6.3 g/dL (ref 6.0–8.3)

## 2019-07-22 NOTE — Assessment & Plan Note (Signed)
Resolved with iron supplementation .  Continue iron supplement for now

## 2019-08-23 DIAGNOSIS — R2231 Localized swelling, mass and lump, right upper limb: Secondary | ICD-10-CM | POA: Diagnosis not present

## 2019-09-01 ENCOUNTER — Ambulatory Visit: Payer: Medicare HMO | Admitting: Gastroenterology

## 2019-09-01 ENCOUNTER — Other Ambulatory Visit: Payer: Self-pay

## 2019-09-01 ENCOUNTER — Other Ambulatory Visit: Payer: Self-pay | Admitting: Internal Medicine

## 2019-09-01 ENCOUNTER — Encounter: Payer: Self-pay | Admitting: Gastroenterology

## 2019-09-01 VITALS — BP 122/75 | HR 66 | Temp 97.6°F | Ht 63.0 in | Wt 164.2 lb

## 2019-09-01 DIAGNOSIS — D509 Iron deficiency anemia, unspecified: Secondary | ICD-10-CM | POA: Diagnosis not present

## 2019-09-01 MED ORDER — LEVOTHYROXINE SODIUM 88 MCG PO TABS: ORAL_TABLET | ORAL | 0 refills | Status: DC

## 2019-09-01 NOTE — Telephone Encounter (Signed)
Medication Refill - Medication: levothyroxine (SYNTHROID) 88 MCG tablet   Preferred Pharmacy (with phone number or street name):  [place], [place] [phone] (Phone) [phone] (Fax)     Agent: Please be advised that RX refills may take up to 3 business days. We ask that you follow-up with your pharmacy.

## 2019-09-01 NOTE — Progress Notes (Signed)
Jonathon Bellows MD, MRCP(U.K) [address]  [place]  [place], [place] [address]  Main: [phone]  Fax: [phone]   Primary Care Physician: Crecencio Mc, MD  Primary Gastroenterologist:  Dr. Jonathon Bellows   Follow-up for iron deficiency anemia.  HPI: Tammy Tate is a 78 y.o. female   Summary of history :  Initially referred and seen on 06/02/2019 for iron deficiency anemia.  2 months prior hemoglobin is 11.5 g with an MCV of 95.6 and a ferritin of 9.  Has been on B12 supplementation for deficiency. She states that she donated a unit of blood sometime in April and another 1 in May or June of this year.  Subsequently she developed some palpitations and was seen by Dr. Derrel Nip and had a CBC checked and was found to be anemic.  She denies any overt blood loss per vagina per rectum, hematemesis, blood in urine.  She denies any weight loss.  She denies any change in bowel movements.  She has a personal history of breast cancer.  She has been on 1 iron tablet pill a day.  Denies use of any blood thinners   Interval history   06/02/2019-09/01/2019  06/02/2019: H. pylori breath test negative 06/10/2019: EGD: Features suggestive of esophageal Candida was seen.  Brushings positive for yeast commenced on Diflucan for 14 days.  6 sessile subcentimeter polyps were seen and resected, they were tubular adenomas.  Biopsies of the duodenum were negative.  07/21/2019 iron studies have normalized.  Hemoglobin 13.9 g with an MCV of 98.9.  CMP normal She is doing well no issues.  On iron pills. Current Outpatient Medications  Medication Sig Dispense Refill  . Biotin (SUPER BIOTIN) 5 MG TABS Take by mouth.    . Ferrous Sulfate (IRON) 325 (65 Fe) MG TABS     . ipratropium (ATROVENT HFA) 17 MCG/ACT inhaler Inhale 2 puffs into the lungs every 6 (six) hours as needed for wheezing. 1 Inhaler 12  . levothyroxine (SYNTHROID) 88 MCG tablet TAKE 1 TABLET EVERY DAY ON EMPTY STOMACHWITH A GLASS OF WATER AT  LEAST 30-60 MINBEFORE BREAKFAST 90 tablet 0  . montelukast (SINGULAIR) 10 MG tablet Take 1 tablet (10 mg total) by mouth at bedtime. 30 tablet 3  . Multiple Vitamins-Minerals (PRESERVISION AREDS 2 PO)     . Omega-3 Fatty Acids (OMEGA 3 PO) Take by mouth daily.    . tizanidine (ZANAFLEX) 2 MG capsule Take 1 capsule (2 mg total) by mouth 3 (three) times daily as needed for muscle spasms. 30 capsule 3  . Turmeric (QC TUMERIC COMPLEX) 500 MG CAPS Take 2 capsules by mouth daily.     . vitamin B-12 (CYANOCOBALAMIN) 1000 MCG tablet Take 3,000 mcg by mouth daily.     Marland Kitchen zolpidem (AMBIEN) 10 MG tablet TAKE ONE TABLET BY MOUTH AT BEDTIME AS NEEDED 30 tablet 2   No current facility-administered medications for this visit.     Allergies as of 09/01/2019  . (No Known Allergies)    ROS:  General: Negative for anorexia, weight loss, fever, chills, fatigue, weakness. ENT: Negative for hoarseness, difficulty swallowing , nasal congestion. CV: Negative for chest pain, angina, palpitations, dyspnea on exertion, peripheral edema.  Respiratory: Negative for dyspnea at rest, dyspnea on exertion, cough, sputum, wheezing.  GI: See history of present illness. GU:  Negative for dysuria, hematuria, urinary incontinence, urinary frequency, nocturnal urination.  Endo: Negative for unusual weight change.    Physical Examination:   There were  no vitals taken for this visit.  General: Well-nourished, well-developed in no acute distress.  Eyes: No icterus. Conjunctivae pink. Mouth: Oropharyngeal mucosa moist and pink , no lesions erythema or exudate. Lungs: Clear to auscultation bilaterally. Non-labored. Heart: Regular rate and rhythm, no murmurs rubs or gallops.  Abdomen: Bowel sounds are normal, nontender, nondistended, no hepatosplenomegaly or masses, no abdominal bruits or hernia , no rebound or guarding.   Extremities: No lower extremity edema. No clubbing or deformities. Neuro: Alert and oriented x 3.   Grossly intact. Skin: Warm and dry, no jaundice.   Psych: Alert and cooperative, normal mood and affect.   Imaging Studies: No results found.  Assessment and Plan:   Tammy Tate is a 78 y.o. y/o female here to follow-up for iron deficiency anemia.  Ferritin 9 when checked a few months back.  History of B12 deficiency as well in the past.  She is on a B12 supplement.  Had donated blood 2 months prior to her labs. Possible that the iron deficiency may be secondary to blood donation but in view of her age I would evaluate this further.  EGD and colonoscopy showed no contributing abnormalities.  Repeat labs in October 2020 showed that her iron studies are normalized.  At this point I would suggest to monitor CBC and iron studies probably once again in 6 months.  If normal needs no further evaluation.  If there is evidence of iron deficiency then may need capsule study of small bowel.  I would ask her to please avoid blood donation till we know for sure that her hemoglobin has been stable for a good 5 to 6 months.  I suggest that she can stop taking the iron pills since her iron studies are normalized and follow-up with Dr. Derrel Nip.  Dr Jonathon Bellows  MD,MRCP [facility]) Follow up in as needed

## 2019-09-02 DIAGNOSIS — L570 Actinic keratosis: Secondary | ICD-10-CM | POA: Diagnosis not present

## 2019-09-02 DIAGNOSIS — D225 Melanocytic nevi of trunk: Secondary | ICD-10-CM | POA: Diagnosis not present

## 2019-09-02 DIAGNOSIS — D2261 Melanocytic nevi of right upper limb, including shoulder: Secondary | ICD-10-CM | POA: Diagnosis not present

## 2019-09-02 DIAGNOSIS — Z08 Encounter for follow-up examination after completed treatment for malignant neoplasm: Secondary | ICD-10-CM | POA: Diagnosis not present

## 2019-09-02 DIAGNOSIS — L821 Other seborrheic keratosis: Secondary | ICD-10-CM | POA: Diagnosis not present

## 2019-09-02 DIAGNOSIS — M19032 Primary osteoarthritis, left wrist: Secondary | ICD-10-CM | POA: Diagnosis not present

## 2019-09-02 DIAGNOSIS — D2262 Melanocytic nevi of left upper limb, including shoulder: Secondary | ICD-10-CM | POA: Diagnosis not present

## 2019-09-02 DIAGNOSIS — D2272 Melanocytic nevi of left lower limb, including hip: Secondary | ICD-10-CM | POA: Diagnosis not present

## 2019-09-02 DIAGNOSIS — D2271 Melanocytic nevi of right lower limb, including hip: Secondary | ICD-10-CM | POA: Diagnosis not present

## 2019-09-02 DIAGNOSIS — D485 Neoplasm of uncertain behavior of skin: Secondary | ICD-10-CM | POA: Diagnosis not present

## 2019-09-02 DIAGNOSIS — Z85828 Personal history of other malignant neoplasm of skin: Secondary | ICD-10-CM | POA: Diagnosis not present

## 2019-09-10 ENCOUNTER — Ambulatory Visit: Payer: Medicare HMO

## 2019-09-17 MED ORDER — ZOLPIDEM TARTRATE 10 MG PO TABS: 10.0000 mg | ORAL_TABLET | Freq: Every evening | ORAL | 2 refills | Status: DC | PRN

## 2019-09-17 NOTE — Telephone Encounter (Signed)
PLEASE CALL [facility] AND CANCEL THE AMBIEN REFILL.  IT HAS BEEN RESENT [facility] TE[facility]ER

## 2019-11-25 DIAGNOSIS — R69 Illness, unspecified: Secondary | ICD-10-CM | POA: Diagnosis not present

## 2019-11-26 ENCOUNTER — Telehealth: Payer: Self-pay | Admitting: Internal Medicine

## 2019-11-26 ENCOUNTER — Other Ambulatory Visit: Payer: Self-pay | Admitting: Internal Medicine

## 2019-11-26 MED ORDER — LEVOTHYROXINE SODIUM 88 MCG PO TABS: ORAL_TABLET | ORAL | 0 refills | Status: DC

## 2019-11-26 NOTE — Telephone Encounter (Signed)
Pt called Tammy Tate and she needs a refill on levothyroxine.

## 2020-03-14 DIAGNOSIS — H353131 Nonexudative age-related macular degeneration, bilateral, early dry stage: Secondary | ICD-10-CM | POA: Diagnosis not present

## 2020-03-17 ENCOUNTER — Other Ambulatory Visit: Payer: Self-pay | Admitting: Orthopedic Surgery

## 2020-03-17 DIAGNOSIS — M549 Dorsalgia, unspecified: Secondary | ICD-10-CM

## 2020-03-17 DIAGNOSIS — M4720 Other spondylosis with radiculopathy, site unspecified: Secondary | ICD-10-CM | POA: Diagnosis not present

## 2020-03-17 DIAGNOSIS — M40204 Unspecified kyphosis, thoracic region: Secondary | ICD-10-CM | POA: Diagnosis not present

## 2020-03-17 DIAGNOSIS — Z9889 Other specified postprocedural states: Secondary | ICD-10-CM | POA: Diagnosis not present

## 2020-03-17 DIAGNOSIS — M7072 Other bursitis of hip, left hip: Secondary | ICD-10-CM | POA: Diagnosis not present

## 2020-03-17 DIAGNOSIS — M438X6 Other specified deforming dorsopathies, lumbar region: Secondary | ICD-10-CM | POA: Diagnosis not present

## 2020-03-17 DIAGNOSIS — M438X4 Other specified deforming dorsopathies, thoracic region: Secondary | ICD-10-CM | POA: Diagnosis not present

## 2020-03-17 DIAGNOSIS — M5136 Other intervertebral disc degeneration, lumbar region: Secondary | ICD-10-CM | POA: Diagnosis not present

## 2020-03-17 DIAGNOSIS — M5116 Intervertebral disc disorders with radiculopathy, lumbar region: Secondary | ICD-10-CM | POA: Diagnosis not present

## 2020-03-17 DIAGNOSIS — M419 Scoliosis, unspecified: Secondary | ICD-10-CM

## 2020-03-17 DIAGNOSIS — M7071 Other bursitis of hip, right hip: Secondary | ICD-10-CM | POA: Diagnosis not present

## 2020-03-17 DIAGNOSIS — M5115 Intervertebral disc disorders with radiculopathy, thoracolumbar region: Secondary | ICD-10-CM | POA: Diagnosis not present

## 2020-03-17 DIAGNOSIS — M5134 Other intervertebral disc degeneration, thoracic region: Secondary | ICD-10-CM | POA: Diagnosis not present

## 2020-03-17 DIAGNOSIS — M5416 Radiculopathy, lumbar region: Secondary | ICD-10-CM | POA: Diagnosis not present

## 2020-03-17 DIAGNOSIS — Y939 Activity, unspecified: Secondary | ICD-10-CM | POA: Diagnosis not present

## 2020-03-17 DIAGNOSIS — M48061 Spinal stenosis, lumbar region without neurogenic claudication: Secondary | ICD-10-CM

## 2020-03-17 DIAGNOSIS — M47819 Spondylosis without myelopathy or radiculopathy, site unspecified: Secondary | ICD-10-CM | POA: Diagnosis not present

## 2020-03-17 DIAGNOSIS — M4184 Other forms of scoliosis, thoracic region: Secondary | ICD-10-CM | POA: Diagnosis not present

## 2020-03-30 DIAGNOSIS — D0439 Carcinoma in situ of skin of other parts of face: Secondary | ICD-10-CM | POA: Diagnosis not present

## 2020-03-30 DIAGNOSIS — D485 Neoplasm of uncertain behavior of skin: Secondary | ICD-10-CM | POA: Diagnosis not present

## 2020-04-04 ENCOUNTER — Ambulatory Visit: Payer: Medicare HMO

## 2020-04-06 DIAGNOSIS — M7061 Trochanteric bursitis, right hip: Secondary | ICD-10-CM | POA: Diagnosis not present

## 2020-04-10 DIAGNOSIS — M7061 Trochanteric bursitis, right hip: Secondary | ICD-10-CM | POA: Diagnosis not present

## 2020-05-01 ENCOUNTER — Ambulatory Visit: Payer: Medicare HMO

## 2020-05-08 ENCOUNTER — Encounter: Payer: Medicare HMO | Admitting: Internal Medicine

## 2020-05-09 ENCOUNTER — Encounter: Payer: Medicare HMO | Admitting: Internal Medicine

## 2020-05-17 ENCOUNTER — Encounter: Payer: Self-pay | Admitting: Internal Medicine

## 2020-05-17 ENCOUNTER — Other Ambulatory Visit: Payer: Self-pay

## 2020-05-17 ENCOUNTER — Ambulatory Visit (INDEPENDENT_AMBULATORY_CARE_PROVIDER_SITE_OTHER): Payer: Medicare HMO | Admitting: Internal Medicine

## 2020-05-17 VITALS — BP 122/74 | HR 54 | Temp 98.4°F | Resp 14 | Ht 63.0 in | Wt 159.4 lb

## 2020-05-17 DIAGNOSIS — D509 Iron deficiency anemia, unspecified: Secondary | ICD-10-CM

## 2020-05-17 DIAGNOSIS — M48061 Spinal stenosis, lumbar region without neurogenic claudication: Secondary | ICD-10-CM | POA: Diagnosis not present

## 2020-05-17 DIAGNOSIS — Z23 Encounter for immunization: Secondary | ICD-10-CM

## 2020-05-17 DIAGNOSIS — Z0000 Encounter for general adult medical examination without abnormal findings: Secondary | ICD-10-CM

## 2020-05-17 DIAGNOSIS — E559 Vitamin D deficiency, unspecified: Secondary | ICD-10-CM

## 2020-05-17 DIAGNOSIS — M8949 Other hypertrophic osteoarthropathy, multiple sites: Secondary | ICD-10-CM | POA: Diagnosis not present

## 2020-05-17 DIAGNOSIS — R69 Illness, unspecified: Secondary | ICD-10-CM | POA: Diagnosis not present

## 2020-05-17 DIAGNOSIS — M159 Polyosteoarthritis, unspecified: Secondary | ICD-10-CM

## 2020-05-17 DIAGNOSIS — M150 Primary generalized (osteo)arthritis: Secondary | ICD-10-CM

## 2020-05-17 DIAGNOSIS — Z8781 Personal history of (healed) traumatic fracture: Secondary | ICD-10-CM

## 2020-05-17 DIAGNOSIS — E7800 Pure hypercholesterolemia, unspecified: Secondary | ICD-10-CM

## 2020-05-17 DIAGNOSIS — D7589 Other specified diseases of blood and blood-forming organs: Secondary | ICD-10-CM

## 2020-05-17 DIAGNOSIS — E538 Deficiency of other specified B group vitamins: Secondary | ICD-10-CM

## 2020-05-17 DIAGNOSIS — F5104 Psychophysiologic insomnia: Secondary | ICD-10-CM

## 2020-05-17 DIAGNOSIS — E034 Atrophy of thyroid (acquired): Secondary | ICD-10-CM

## 2020-05-17 DIAGNOSIS — R944 Abnormal results of kidney function studies: Secondary | ICD-10-CM

## 2020-05-17 LAB — CBC WITH DIFFERENTIAL/PLATELET
Basophils Absolute: 0.1 10*3/uL (ref 0.0–0.1)
Basophils Relative: 0.8 % (ref 0.0–3.0)
Eosinophils Absolute: 0.2 10*3/uL (ref 0.0–0.7)
Eosinophils Relative: 3.3 % (ref 0.0–5.0)
HCT: 41.5 % (ref 36.0–46.0)
Hemoglobin: 14.3 g/dL (ref 12.0–15.0)
Lymphocytes Relative: 15.4 % (ref 12.0–46.0)
Lymphs Abs: 1 10*3/uL (ref 0.7–4.0)
MCHC: 34.5 g/dL (ref 30.0–36.0)
MCV: 102.7 fl — ABNORMAL HIGH (ref 78.0–100.0)
Monocytes Absolute: 0.7 10*3/uL (ref 0.1–1.0)
Monocytes Relative: 11.8 % (ref 3.0–12.0)
Neutro Abs: 4.3 10*3/uL (ref 1.4–7.7)
Neutrophils Relative %: 68.7 % (ref 43.0–77.0)
Platelets: 287 10*3/uL (ref 150.0–400.0)
RBC: 4.04 Mil/uL (ref 3.87–5.11)
RDW: 13 % (ref 11.5–15.5)
WBC: 6.3 10*3/uL (ref 4.0–10.5)

## 2020-05-17 LAB — LIPID PANEL
Cholesterol: 210 mg/dL — ABNORMAL HIGH (ref 0–200)
HDL: 93.3 mg/dL (ref >39.00)
LDL Cholesterol: 104 mg/dL — ABNORMAL HIGH (ref 0–99)
NonHDL: 116.49
Total CHOL/HDL Ratio: 2
Triglycerides: 61 mg/dL (ref 0.0–149.0)
VLDL: 12.2 mg/dL (ref 0.0–40.0)

## 2020-05-17 LAB — COMPREHENSIVE METABOLIC PANEL
ALT: 16 U/L (ref 0–35)
AST: 18 U/L (ref 0–37)
Albumin: 4.1 g/dL (ref 3.5–5.2)
Alkaline Phosphatase: 61 U/L (ref 39–117)
BUN: 22 mg/dL (ref 6–23)
CO2: 29 mEq/L (ref 19–32)
Calcium: 9.3 mg/dL (ref 8.4–10.5)
Chloride: 101 mEq/L (ref 96–112)
Creatinine, Ser: 0.95 mg/dL (ref 0.40–1.20)
GFR: 56.8 mL/min — ABNORMAL LOW (ref >60.00)
Glucose, Bld: 80 mg/dL (ref 70–99)
Potassium: 4.6 mEq/L (ref 3.5–5.1)
Sodium: 138 mEq/L (ref 135–145)
Total Bilirubin: 0.6 mg/dL (ref 0.2–1.2)
Total Protein: 6.9 g/dL (ref 6.0–8.3)

## 2020-05-17 LAB — VITAMIN D 25 HYDROXY (VIT D DEFICIENCY, FRACTURES): VITD: 36.81 ng/mL (ref 30.00–100.00)

## 2020-05-17 LAB — IBC + FERRITIN
Ferritin: 20.3 ng/mL (ref 10.0–291.0)
Iron: 98 ug/dL (ref 42–145)
Saturation Ratios: 27.5 % (ref 20.0–50.0)
Transferrin: 255 mg/dL (ref 212.0–360.0)

## 2020-05-17 LAB — TSH: TSH: 3.47 u[IU]/mL (ref 0.35–4.50)

## 2020-05-17 LAB — VITAMIN B12: Vitamin B-12: 1109 pg/mL — ABNORMAL HIGH (ref 211–911)

## 2020-05-17 MED ORDER — PREDNISONE 10 MG PO TABS: ORAL_TABLET | ORAL | 0 refills | Status: DC

## 2020-05-17 MED ORDER — TETANUS-DIPHTH-ACELL PERTUSSIS 5-2.5-18.5 LF-MCG/0.5 IM SUSP: 0.5000 mL | Freq: Once | INTRAMUSCULAR | 0 refills | Status: AC

## 2020-05-17 NOTE — Patient Instructions (Addendum)
You can get your flu and your Tetanus vaccines on the same day.    Your recent x rays showed a T12 vertebral fracture that was not seen on the 2017 MRI  This may indicate osteoporosis.    We are going to repeat your DEXA scan before we discuss therapy   Exercise: (supported back extension, standing)  Stand against a counter or sofa with your buttocks resting on the edge and your hands on the edge as well on either side of your back  Slowly lean backwards,  Bending from the waist, until you feel slight discomfort. Restore yourself to vertical position (up straight) Repeat the back extension 10 times , each time extending a little farther).  What should you expect? The pain should recede from the calf/thigh/buttocks but may localize to the lower back  If it does not,  Or if it makes the leg pain worse, STOP doing it.   If it results in improvement,  Repeat the exercise every 2-3 hours while awake and STOP THE OTHER EXERCISES that do the opposite motion (back flexion)  .

## 2020-05-17 NOTE — Assessment & Plan Note (Signed)
Noted during recent orthopedic workup for chronic pack pain . Not seen on 2017 MRI.  DEXA scan needs repeating

## 2020-05-17 NOTE — Progress Notes (Signed)
Patient ID: Tammy Tate, female    DOB: [birth year]  Age: 79 y.o. MRN: [id number]  The patient is here for annual preventive examination and management of other chronic and acute problems. Last appt was 2018! This visit occurred during the SARS-CoV-2 public health emergency.  Safety protocols were in place, including screening questions prior to the visit, additional usage of staff PPE, and extensive cleaning of exam room while observing appropriate contact time as indicated for disinfecting solutions.    Patient has received both doses of the available COVID 19 vaccine without complications.  Patient continues to mask when outside of the home except when walking in yard or at safe distances from others .  Patient denies any change in mood or development of unhealthy behaviors resuting from the pandemic's restriction of activities and socialization.    Going to [place] in September with a girlfriend to visit a friend.  Tammy Tate not going due to fear of travel.     The risk factors are reflected in the social history.  The roster of all physicians providing medical care to patient - is listed in the Snapshot section of the chart.  Activities of daily living:  The patient is 100% independent in all ADLs: dressing, toileting, feeding as well as independent mobility  Home safety : The patient has smoke detectors in the home. They wear seatbelts.  There are no firearms at home. There is no violence in the home.   There is no risks for hepatitis, STDs or HIV. There is no   history of blood transfusion. They have no travel history to infectious disease endemic areas of the world.  The patient has seen their dentist in the last six month. They have seen their eye doctor in the last year. They admit to slight hearing difficulty with regard to whispered voices and some television programs.  They have deferred audiologic testing in the last year.  They do not  have excessive sun exposure. Discussed the  need for sun protection: hats, long sleeves and use of sunscreen if there is significant sun exposure.   Diet: the importance of a healthy diet is discussed. They do have a healthy diet.  The benefits of regular aerobic exercise were discussed. She walks   4 times per week ,  30 minutes.  And walking in the salt water pool Depression screen: there are no signs or vegative symptoms of depression- irritability, change in appetite, anhedonia, sadness/tearfullness.  Cognitive assessment: the patient manages all their financial and personal affairs and is actively engaged. They could relate day,date,year and events; recalled 2/3 objects at 3 minutes; performed clock-face test normally.  The following portions of the patient's history were reviewed and updated as appropriate: allergies, current medications, past family history, past medical history,  past surgical history, past social history  and problem list.  Visual acuity was not assessed per patient preference since she has regular follow up with her ophthalmologist. Hearing and body mass index were assessed and reviewed.   During the course of the visit the patient was educated and counseled about appropriate screening and preventive services including : fall prevention , diabetes screening, nutrition counseling, colorectal cancer screening, and recommended immunizations.    CC: The primary encounter diagnosis was High priority for 2019 novel coronavirus vaccination. Diagnoses of History of vertebral fracture, B12 deficiency, Vitamin D deficiency, Pure hypercholesterolemia, Hypothyroidism due to acquired atrophy of thyroid, Iron deficiency anemia, unspecified iron deficiency anemia type, Visit for preventive health examination, Chronic insomnia,  Primary osteoarthritis involving multiple joints, and Spinal stenosis of lumbar region without neurogenic claudication were also pertinent to this visit.  Taking biotin   wants the booster.    Wants a  prednisone pack to take to [place]  Taking preservision for macular degeneration.  Takes b12 , tuermeric and co 10   Back pain managed by [facility].  Missed the injection due to light fever,  Hasn't been called back after 5 calls.  Going to go to [place] has a past medical history of Allergy, B12 deficiency, Bronchitis, Cancer ([place]) (2010), Chicken pox, Degenerative arthritis of lumbar spine, Degenerative disk disease, GERD (gastroesophageal reflux disease), Migraine with aura, Thyroid disease, and Vasomotor rhinitis.   She has a past surgical history that includes Appendectomy; Modified radical mastectomy w/ axillary lymph node dissection w/ pectoral minor preservation (2010); Abdominal hysterectomy; Colonoscopy with propofol (N/A, 06/10/2019); and Esophagogastroduodenoscopy (egd) with propofol (N/A, 06/10/2019).   Her family history includes Cancer (age of onset: 58) in her mother; Diabetes in her maternal grandfather; Heart disease in her father; Hyperlipidemia in her father; Pancreatitis in her daughter; Stroke (age of onset: 71) in her father.She reports that she quit smoking about 48 years ago. She has never used smokeless tobacco. She reports current alcohol use of about 7.0 standard drinks of alcohol per week. She reports that she does not use drugs.  Outpatient Medications Prior to Visit  Medication Sig Dispense Refill  . Biotin (SUPER BIOTIN) 5 MG TABS Take by mouth.    Marland Kitchen ipratropium (ATROVENT HFA) 17 MCG/ACT inhaler Inhale 2 puffs into the lungs every 6 (six) hours as needed for wheezing. 1 Inhaler 12  . levothyroxine (SYNTHROID) 88 MCG tablet TAKE ONE TABLET BY MOUTH DAILY ON AN EMPTY STOMACH WITH A GLASS OF WATER AT LEAST 30-60 MINUTES BEFORE BREAKFAST 90 tablet 0  . Multiple Vitamins-Minerals (PRESERVISION AREDS 2 PO)     . Omega-3 Fatty Acids (OMEGA 3 PO) Take by mouth daily.    . Turmeric (QC TUMERIC COMPLEX) 500 MG CAPS Take 2 capsules by mouth daily.      . vitamin B-12 (CYANOCOBALAMIN) 1000 MCG tablet Take 3,000 mcg by mouth daily.     Marland Kitchen zolpidem (AMBIEN) 10 MG tablet Take 1 tablet (10 mg total) by mouth at bedtime as needed. 30 tablet 2  . Ferrous Sulfate (IRON) 325 (65 Fe) MG TABS     . montelukast (SINGULAIR) 10 MG tablet Take 1 tablet (10 mg total) by mouth at bedtime. 30 tablet 3  . tizanidine (ZANAFLEX) 2 MG capsule Take 1 capsule (2 mg total) by mouth 3 (three) times daily as needed for muscle spasms. 30 capsule 3   No facility-administered medications prior to visit.    Review of Systems   Patient denies headache, fevers, malaise, unintentional weight loss, skin rash, eye pain, sinus congestion and sinus pain, sore throat, dysphagia,  hemoptysis , cough, dyspnea, wheezing, chest pain, palpitations, orthopnea, edema, abdominal pain, nausea, melena, diarrhea, constipation, flank pain, dysuria, hematuria, urinary  Frequency, nocturia, numbness, tingling, seizures,  Focal weakness, Loss of consciousness,  Tremor, insomnia, depression, anxiety, and suicidal ideation.      Objective:  BP 122/74 (BP Location: Left Arm, Patient Position: Sitting, Cuff Size: Normal)   Pulse (!) 54   Temp 98.4 F (36.9 C) (Oral)   Resp 14   Ht 5\' 3"  (1.6 m)   Wt 159 lb 6.4 oz (72.3 kg)   SpO2 98%   BMI 28.24 kg/m  Physical Exam  General appearance: alert, cooperative and appears stated age Ears: normal TM's and external ear canals both ears Throat: lips, mucosa, and tongue normal; teeth and gums normal Neck: no adenopathy, no carotid bruit, supple, symmetrical, trachea midline and thyroid not enlarged, symmetric, no tenderness/mass/nodules Back: symmetric, no curvature. ROM normal. No CVA tenderness. Lungs: clear to auscultation bilaterally Heart: regular rate and rhythm, S1, S2 normal, no murmur, click, rub or gallop Abdomen: soft, non-tender; bowel sounds normal; no masses,  no organomegaly Pulses: 2+ and symmetric Skin: Skin color, texture,  turgor normal. No rashes or lesions Lymph nodes: Cervical, supraclavicular, and axillary nodes normal.  Assessment & Plan:   Problem List Items Addressed This Visit      Unprioritized   B12 deficiency   Relevant Orders   Vitamin B12 (Completed)   HLD (hyperlipidemia)   Relevant Orders   Lipid panel (Completed)   Comprehensive metabolic panel (Completed)   Arthritis, degenerative    She is requesting a steroid taper to take with her to [place] for joint pain       Relevant Medications   predniSONE (DELTASONE) 10 MG tablet   Chronic insomnia    Managed with prn ambien. The risks and benefits of hypnotic use by the elderly were reviewedwith patient today including excessive sedation , confusion, ,  impaired thinking/driving, and addiction.  Patient was advised to avoid concurrent use with alcohol, to use medication only as needed and not to share with others  .       History of vertebral fracture    Noted during recent orthopedic workup for chronic pack pain . Not seen on 2017 MRI.  DEXA scan needs repeating       Relevant Orders   DG Bone Density   Hypothyroidism    TSH is therapeutic,  Continue current levothyoxine dose,  Recheck in 6 montths   Lab Results  Component Value Date   TSH 3.47 05/17/2020         Relevant Orders   TSH (Completed)   Iron deficiency anemia    No source in colon or esophagus found.  secondary to blood donation.  Normalized with iron daily , now stopped   Lab Results  Component Value Date   WBC 6.3 05/17/2020   HGB 14.3 05/17/2020   HCT 41.5 05/17/2020   MCV 102.7 (H) 05/17/2020   PLT 287.0 05/17/2020   Lab Results  Component Value Date   IRON 98 05/17/2020   TIBC 393 05/07/2019   FERRITIN 20.3 05/17/2020        Relevant Orders   IBC + Ferritin (Completed)   CBC with Differential/Platelet (Completed)   Lumbar spinal stenosis    No surgical intervention per Dr Sherwood Gambler.  Symptoms controlled with stretching,  Prn aleve,  Exercises  demonstrated       Visit for preventive health examination    age appropriate education and counseling updated, referrals for preventative services and immunizations addressed, dietary and smoking counseling addressed, most recent labs reviewed.  I have personally reviewed and have noted:  1) the patient's medical and social history 2) The pt's use of alcohol, tobacco, and illicit drugs 3) The patient's current medications and supplements 4) Functional ability including ADL's, fall risk, home safety risk, hearing and visual impairment 5) Diet and physical activities 6) Evidence for depression or mood disorder 7) The patient's height, weight, and BMI have been recorded in the chart  I have made referrals, and provided counseling and education based on  review of the above       Other Visit Diagnoses    High priority for 2019 novel coronavirus vaccination    -  Primary   Relevant Orders   SARS-CoV-2 Semi-Quantitative Total Antibody, Spike   Vitamin D deficiency       Relevant Orders   VITAMIN D 25 Hydroxy (Vit-D Deficiency, Fractures) (Completed)      I have discontinued Donzella S. Bethards "Raye"'s tizanidine, Iron, and montelukast. I am also having her start on predniSONE and Tdap. Additionally, I am having her maintain her Omega-3 Fatty Acids (OMEGA 3 PO), vitamin B-12, Biotin, Multiple Vitamins-Minerals (PRESERVISION AREDS 2 PO), Turmeric, Atrovent HFA, zolpidem, and levothyroxine.  Meds ordered this encounter  Medications  . predniSONE (DELTASONE) 10 MG tablet    Sig: 4 tablets daily for 7 days,  Then  reduce by 1 tablet daily until gone    Dispense:  34 tablet    Refill:  0  . Tdap (BOOSTRIX) 5-2.5-18.5 LF-MCG/0.5 injection    Sig: Inject 0.5 mLs into the muscle once for 1 dose.    Dispense:  0.5 mL    Refill:  0    Medications Discontinued During This Encounter  Medication Reason  . tizanidine (ZANAFLEX) 2 MG capsule   . Ferrous Sulfate (IRON) 325 (65 Fe) MG TABS   .  montelukast (SINGULAIR) 10 MG tablet     Follow-up: Return in about 6 months (around 11/17/2020).   Crecencio Mc, MD

## 2020-05-17 NOTE — Assessment & Plan Note (Signed)
No source in colon or esophagus found.  secondary to blood donation.  Normalized with iron daily , now stopped   Lab Results  Component Value Date   WBC 6.3 05/17/2020   HGB 14.3 05/17/2020   HCT 41.5 05/17/2020   MCV 102.7 (H) 05/17/2020   PLT 287.0 05/17/2020   Lab Results  Component Value Date   IRON 98 05/17/2020   TIBC 393 05/07/2019   FERRITIN 20.3 05/17/2020

## 2020-05-17 NOTE — Assessment & Plan Note (Signed)
She is requesting a steroid taper to take with her to [place] for joint pain

## 2020-05-17 NOTE — Assessment & Plan Note (Signed)
No surgical intervention per Dr Sherwood Gambler.  Symptoms controlled with stretching,  Prn aleve,  Exercises demonstrated

## 2020-05-17 NOTE — Assessment & Plan Note (Signed)

## 2020-05-17 NOTE — Assessment & Plan Note (Signed)
Managed with prn ambien. The risks and benefits of hypnotic use by the elderly were reviewedwith patient today including excessive sedation , confusion, ,  impaired thinking/driving, and addiction.  Patient was advised to avoid concurrent use with alcohol, to use medication only as needed and not to share with others  .

## 2020-05-17 NOTE — Assessment & Plan Note (Signed)
TSH is therapeutic,  Continue current levothyoxine dose,  Recheck in 6 montths   Lab Results  Component Value Date   TSH 3.47 05/17/2020

## 2020-05-18 ENCOUNTER — Other Ambulatory Visit: Payer: Self-pay | Admitting: Internal Medicine

## 2020-05-18 NOTE — Telephone Encounter (Signed)
Refill request for Ambien, last seen 05-16-20, last filled 09-17-19.  Please advise.

## 2020-05-18 NOTE — Addendum Note (Signed)
Addended by: Crecencio Mc on: 05/18/2020 07:01 PM   Modules accepted: Orders

## 2020-05-19 DIAGNOSIS — R69 Illness, unspecified: Secondary | ICD-10-CM | POA: Diagnosis not present

## 2020-05-22 ENCOUNTER — Telehealth: Payer: Self-pay

## 2020-05-22 LAB — SARS-COV-2 SEMI-QUANTITATIVE TOTAL ANTIBODY, SPIKE: SARS COV2 AB, Total Spike Semi QN: 45.9 U/mL — ABNORMAL HIGH (ref <0.8)

## 2020-05-22 NOTE — Telephone Encounter (Signed)
PA for Zolpidem has been submitted on covermymeds and denied by insurance. Medicare does not allow coverage for this medication.

## 2020-05-23 NOTE — Telephone Encounter (Signed)
Ambien:    Your insurance has denied coverage for the medication listed above,  Even with a prior authorization.  If you would like to pay out of pocket, let me know and I will send the prescription to your pharmacy again.  If you would like to discuss alternatives,  Please respond with a report of alternative medications you have already tried and whether they were effective or not.   Regards,   Deborra Medina, MD

## 2020-05-26 ENCOUNTER — Other Ambulatory Visit: Payer: Self-pay | Admitting: Internal Medicine

## 2020-05-26 DIAGNOSIS — M545 Low back pain: Secondary | ICD-10-CM | POA: Diagnosis not present

## 2020-05-26 MED ORDER — LEVOTHYROXINE SODIUM 88 MCG PO TABS: ORAL_TABLET | ORAL | 0 refills | Status: DC

## 2020-06-01 ENCOUNTER — Telehealth: Payer: Self-pay | Admitting: Internal Medicine

## 2020-06-01 NOTE — Telephone Encounter (Signed)
PA has been faxed in.

## 2020-06-01 NOTE — Telephone Encounter (Signed)
Melissa from [facility], [phone]. Needs a prior authorization  today by 3pm, on zolpidem (AMBIEN) 10 MG tablet.

## 2020-06-03 DIAGNOSIS — Z03818 Encounter for observation for suspected exposure to other biological agents ruled out: Secondary | ICD-10-CM | POA: Diagnosis not present

## 2020-06-08 NOTE — Telephone Encounter (Signed)
PA has been approved through 09/29/2020. Pt is aware.

## 2020-06-19 DIAGNOSIS — M5416 Radiculopathy, lumbar region: Secondary | ICD-10-CM | POA: Diagnosis not present

## 2020-07-04 DIAGNOSIS — G8929 Other chronic pain: Secondary | ICD-10-CM | POA: Diagnosis not present

## 2020-07-04 DIAGNOSIS — Z87891 Personal history of nicotine dependence: Secondary | ICD-10-CM | POA: Diagnosis not present

## 2020-07-04 DIAGNOSIS — Z823 Family history of stroke: Secondary | ICD-10-CM | POA: Diagnosis not present

## 2020-07-04 DIAGNOSIS — Z85828 Personal history of other malignant neoplasm of skin: Secondary | ICD-10-CM | POA: Diagnosis not present

## 2020-07-04 DIAGNOSIS — M1990 Unspecified osteoarthritis, unspecified site: Secondary | ICD-10-CM | POA: Diagnosis not present

## 2020-07-04 DIAGNOSIS — Z791 Long term (current) use of non-steroidal anti-inflammatories (NSAID): Secondary | ICD-10-CM | POA: Diagnosis not present

## 2020-07-04 DIAGNOSIS — R030 Elevated blood-pressure reading, without diagnosis of hypertension: Secondary | ICD-10-CM | POA: Diagnosis not present

## 2020-07-04 DIAGNOSIS — E039 Hypothyroidism, unspecified: Secondary | ICD-10-CM | POA: Diagnosis not present

## 2020-07-04 DIAGNOSIS — Z809 Family history of malignant neoplasm, unspecified: Secondary | ICD-10-CM | POA: Diagnosis not present

## 2020-07-04 DIAGNOSIS — M810 Age-related osteoporosis without current pathological fracture: Secondary | ICD-10-CM | POA: Diagnosis not present

## 2020-07-11 DIAGNOSIS — M5416 Radiculopathy, lumbar region: Secondary | ICD-10-CM | POA: Diagnosis not present

## 2020-07-12 ENCOUNTER — Other Ambulatory Visit: Payer: Medicare HMO

## 2020-07-18 ENCOUNTER — Ambulatory Visit
Admission: RE | Admit: 2020-07-18 | Discharge: 2020-07-18 | Disposition: A | Discharge status: Home / Self Care | Payer: Medicare HMO | Source: Ambulatory Visit | Attending: Internal Medicine | Admitting: Internal Medicine

## 2020-07-18 ENCOUNTER — Other Ambulatory Visit: Payer: Self-pay

## 2020-07-18 DIAGNOSIS — M85852 Other specified disorders of bone density and structure, left thigh: Secondary | ICD-10-CM | POA: Diagnosis not present

## 2020-07-18 DIAGNOSIS — E039 Hypothyroidism, unspecified: Secondary | ICD-10-CM | POA: Diagnosis not present

## 2020-07-18 DIAGNOSIS — Z87828 Personal history of other (healed) physical injury and trauma: Secondary | ICD-10-CM | POA: Diagnosis not present

## 2020-07-18 DIAGNOSIS — Z13820 Encounter for screening for osteoporosis: Secondary | ICD-10-CM | POA: Insufficient documentation

## 2020-07-18 DIAGNOSIS — Z780 Asymptomatic menopausal state: Secondary | ICD-10-CM | POA: Insufficient documentation

## 2020-07-18 DIAGNOSIS — M419 Scoliosis, unspecified: Secondary | ICD-10-CM | POA: Insufficient documentation

## 2020-07-18 DIAGNOSIS — Z8781 Personal history of (healed) traumatic fracture: Secondary | ICD-10-CM

## 2020-07-18 DIAGNOSIS — Z8262 Family history of osteoporosis: Secondary | ICD-10-CM | POA: Diagnosis not present

## 2020-07-19 DIAGNOSIS — M5416 Radiculopathy, lumbar region: Secondary | ICD-10-CM | POA: Diagnosis not present

## 2020-07-20 NOTE — Progress Notes (Signed)
Your recent  DEXA scan  showed mild to moderate bone loss in the "mild osteopenia range." Your risk of a major fracture in the next ten years is 13%.  I recommend regular weight bearing exercise, 1200 mg of calcium (combined diet and supplements) , a minimum intake of 1000 units of vitamin  D3 daily,  and  repeating your DEXA  in 2 years to reassess.    Regards,  Dr. Derrel Nip

## 2020-07-24 DIAGNOSIS — M5136 Other intervertebral disc degeneration, lumbar region: Secondary | ICD-10-CM | POA: Diagnosis not present

## 2020-07-24 DIAGNOSIS — M48062 Spinal stenosis, lumbar region with neurogenic claudication: Secondary | ICD-10-CM | POA: Diagnosis not present

## 2020-08-07 DIAGNOSIS — M545 Low back pain, unspecified: Secondary | ICD-10-CM | POA: Diagnosis not present

## 2020-08-07 DIAGNOSIS — M6281 Muscle weakness (generalized): Secondary | ICD-10-CM | POA: Diagnosis not present

## 2020-09-01 ENCOUNTER — Other Ambulatory Visit: Payer: Self-pay | Admitting: Internal Medicine

## 2020-09-05 DIAGNOSIS — M7542 Impingement syndrome of left shoulder: Secondary | ICD-10-CM | POA: Diagnosis not present

## 2020-09-05 DIAGNOSIS — H353131 Nonexudative age-related macular degeneration, bilateral, early dry stage: Secondary | ICD-10-CM | POA: Diagnosis not present

## 2020-09-06 DIAGNOSIS — D2271 Melanocytic nevi of right lower limb, including hip: Secondary | ICD-10-CM | POA: Diagnosis not present

## 2020-09-06 DIAGNOSIS — L570 Actinic keratosis: Secondary | ICD-10-CM | POA: Diagnosis not present

## 2020-09-06 DIAGNOSIS — D2261 Melanocytic nevi of right upper limb, including shoulder: Secondary | ICD-10-CM | POA: Diagnosis not present

## 2020-09-06 DIAGNOSIS — D2262 Melanocytic nevi of left upper limb, including shoulder: Secondary | ICD-10-CM | POA: Diagnosis not present

## 2020-09-06 DIAGNOSIS — Z85828 Personal history of other malignant neoplasm of skin: Secondary | ICD-10-CM | POA: Diagnosis not present

## 2020-09-06 DIAGNOSIS — X32XXXA Exposure to sunlight, initial encounter: Secondary | ICD-10-CM | POA: Diagnosis not present

## 2020-09-06 DIAGNOSIS — D225 Melanocytic nevi of trunk: Secondary | ICD-10-CM | POA: Diagnosis not present

## 2020-10-17 DIAGNOSIS — M25512 Pain in left shoulder: Secondary | ICD-10-CM | POA: Diagnosis not present

## 2020-10-17 DIAGNOSIS — M7061 Trochanteric bursitis, right hip: Secondary | ICD-10-CM | POA: Diagnosis not present

## 2020-11-07 DIAGNOSIS — H353131 Nonexudative age-related macular degeneration, bilateral, early dry stage: Secondary | ICD-10-CM | POA: Diagnosis not present

## 2020-11-13 ENCOUNTER — Other Ambulatory Visit (HOSPITAL_COMMUNITY): Payer: Self-pay | Admitting: Ophthalmology

## 2020-11-13 ENCOUNTER — Other Ambulatory Visit: Payer: Self-pay | Admitting: Ophthalmology

## 2020-11-13 DIAGNOSIS — H353131 Nonexudative age-related macular degeneration, bilateral, early dry stage: Secondary | ICD-10-CM | POA: Diagnosis not present

## 2020-11-13 DIAGNOSIS — H539 Unspecified visual disturbance: Secondary | ICD-10-CM

## 2020-11-14 DIAGNOSIS — M25512 Pain in left shoulder: Secondary | ICD-10-CM | POA: Diagnosis not present

## 2020-11-16 ENCOUNTER — Other Ambulatory Visit: Payer: Self-pay

## 2020-11-16 ENCOUNTER — Encounter: Payer: Self-pay | Admitting: Internal Medicine

## 2020-11-16 ENCOUNTER — Ambulatory Visit (INDEPENDENT_AMBULATORY_CARE_PROVIDER_SITE_OTHER): Payer: Medicare HMO | Admitting: Internal Medicine

## 2020-11-16 VITALS — BP 118/76 | HR 58 | Temp 97.7°F | Resp 15 | Ht 63.0 in | Wt 158.4 lb

## 2020-11-16 DIAGNOSIS — D500 Iron deficiency anemia secondary to blood loss (chronic): Secondary | ICD-10-CM

## 2020-11-16 DIAGNOSIS — R635 Abnormal weight gain: Secondary | ICD-10-CM | POA: Diagnosis not present

## 2020-11-16 DIAGNOSIS — Z23 Encounter for immunization: Secondary | ICD-10-CM | POA: Diagnosis not present

## 2020-11-16 DIAGNOSIS — E034 Atrophy of thyroid (acquired): Secondary | ICD-10-CM | POA: Diagnosis not present

## 2020-11-16 DIAGNOSIS — H259 Unspecified age-related cataract: Secondary | ICD-10-CM | POA: Diagnosis not present

## 2020-11-16 DIAGNOSIS — M8949 Other hypertrophic osteoarthropathy, multiple sites: Secondary | ICD-10-CM

## 2020-11-16 DIAGNOSIS — E7800 Pure hypercholesterolemia, unspecified: Secondary | ICD-10-CM

## 2020-11-16 DIAGNOSIS — F5104 Psychophysiologic insomnia: Secondary | ICD-10-CM

## 2020-11-16 DIAGNOSIS — R69 Illness, unspecified: Secondary | ICD-10-CM | POA: Diagnosis not present

## 2020-11-16 DIAGNOSIS — M159 Polyosteoarthritis, unspecified: Secondary | ICD-10-CM

## 2020-11-16 LAB — COMPREHENSIVE METABOLIC PANEL
ALT: 17 U/L (ref 0–35)
AST: 18 U/L (ref 0–37)
Albumin: 4.1 g/dL (ref 3.5–5.2)
Alkaline Phosphatase: 52 U/L (ref 39–117)
BUN: 17 mg/dL (ref 6–23)
CO2: 30 mEq/L (ref 19–32)
Calcium: 9.5 mg/dL (ref 8.4–10.5)
Chloride: 102 mEq/L (ref 96–112)
Creatinine, Ser: 0.87 mg/dL (ref 0.40–1.20)
GFR: 63.5 mL/min (ref >60.00)
Glucose, Bld: 84 mg/dL (ref 70–99)
Potassium: 4.1 mEq/L (ref 3.5–5.1)
Sodium: 139 mEq/L (ref 135–145)
Total Bilirubin: 0.6 mg/dL (ref 0.2–1.2)
Total Protein: 6.5 g/dL (ref 6.0–8.3)

## 2020-11-16 LAB — TSH: TSH: 2.31 u[IU]/mL (ref 0.35–4.50)

## 2020-11-16 LAB — LIPID PANEL
Cholesterol: 205 mg/dL — ABNORMAL HIGH (ref 0–200)
HDL: 99.3 mg/dL (ref >39.00)
LDL Cholesterol: 90 mg/dL (ref 0–99)
NonHDL: 106.12
Total CHOL/HDL Ratio: 2
Triglycerides: 82 mg/dL (ref 0.0–149.0)
VLDL: 16.4 mg/dL (ref 0.0–40.0)

## 2020-11-16 LAB — HEMOGLOBIN A1C: Hgb A1c MFr Bld: 5.1 % (ref 4.6–6.5)

## 2020-11-16 MED ORDER — ALPRAZOLAM 0.25 MG PO TABS
0.2500 mg | ORAL_TABLET | Freq: Two times a day (BID) | ORAL | 0 refills | Status: DC | PRN
Start: 2020-11-16 — End: 2021-05-28

## 2020-11-16 NOTE — Assessment & Plan Note (Addendum)
Secondary to blood donation .  Pre donation hgb was 15 per patient recently

## 2020-11-16 NOTE — Progress Notes (Signed)
Subjective:  Patient ID: Tammy Tate, female    DOB: 26-Feb-1941  Age: 80 y.o. MRN: [id number]  CC: The primary encounter diagnosis was Hypothyroidism due to acquired atrophy of thyroid. Diagnoses of Iron deficiency anemia due to chronic blood loss, Pure hypercholesterolemia, Weight gain, COVID-19 vaccine administered, Primary osteoarthritis involving multiple joints, Chronic insomnia, and Age-related cataract of both eyes, unspecified age-related cataract type were also pertinent to this visit.  HPI Tammy Tate presents for follow up  This visit occurred during the SARS-CoV-2 public health emergency.  Safety protocols were in place, including screening questions prior to the visit, additional usage of staff PPE, and extensive cleaning of exam room while observing appropriate contact time as indicated for disinfecting solutions.    Seeing ortho for multiple joint issues .  Stressed out by husband Tammy Tate's aging and declining health due to recurrent chronic diarrhea,  Dehydration and recent fall while out dining with friends. Fortunately they were accompanied by Tammy Tate (Ortho),  Saw GI  Tammy Tammy Tate at [place] yesterday , who suggested Tammy Tate change PPIs from prilosec to another PPI  Which was done.  Social isolation playing a role.   Gave blood last week   hgb was 15.9 pre donation .  History of iron deficiency  No longer seeing neurosurgeon who retired.  Spinal stenosis not bothering her currently.  Left shoulder received a cortisone injection earlier this week   Fingers feel numb bilaterally only in the morning  Some vision loss   With reading and coomputer screens but not with driving ,  Has macular degeneration and cataracts ,  Saw Tammy Tate last week.  MRI this Sunday   Depression screen negative.  But feels frustrated at how long it takes to do things at this point I nhe rlife.   Insomnia :  aggravated by 2:30 wake up .  Attributes it to 2nd glass of wine with  dinner    Exercising differently with yoga, being careful with her back   Outpatient Medications Prior to Visit  Medication Sig Dispense Refill  . Biotin 5 MG TABS Take by mouth.    . levothyroxine (SYNTHROID) 88 MCG tablet TAKE ONE TABLET BY MOUTH DAILY ON AN EMPTY STOMACH WITH A GLASS OF WATER AT LEAST 30-60 MINUTES BEFORE BREAKFAST 90 tablet 0  . Multiple Vitamins-Minerals (PRESERVISION AREDS 2 PO)     . Omega-3 Fatty Acids (OMEGA 3 PO) Take by mouth daily.    . Turmeric 500 MG CAPS Take 2 capsules by mouth daily.     . vitamin B-12 (CYANOCOBALAMIN) 1000 MCG tablet Take 3,000 mcg by mouth daily.     Marland Kitchen zolpidem (AMBIEN) 10 MG tablet TAKE ONE TABLET BY MOUTH EVERY NIGHT AT BEDTIME AS NEEDED 30 tablet 5  . ipratropium (ATROVENT HFA) 17 MCG/ACT inhaler Inhale 2 puffs into the lungs every 6 (six) hours as needed for wheezing. 1 Inhaler 12  . predniSONE (DELTASONE) 10 MG tablet 4 tablets daily for 7 days,  Then  reduce by 1 tablet daily until gone 34 tablet 0   No facility-administered medications prior to visit.    Review of Systems;  Patient denies headache, fevers, malaise, unintentional weight loss, skin rash, eye pain, sinus congestion and sinus pain, sore throat, dysphagia,  hemoptysis , cough, dyspnea, wheezing, chest pain, palpitations, orthopnea, edema, abdominal pain, nausea, melena, diarrhea, constipation, flank pain, dysuria, hematuria, urinary  Frequency, nocturia, numbness, tingling, seizures,  Focal weakness, Loss of consciousness,  Tremor, insomnia,  depression, anxiety, and suicidal ideation.      Objective:  BP 118/76 (BP Location: Left Arm, Patient Position: Sitting, Cuff Size: Normal)   Pulse (!) 58   Temp 97.7 F (36.5 C) (Oral)   Resp 15   Ht 5\' 3"  (1.6 m)   Wt 158 lb 6.4 oz (71.8 kg)   SpO2 99%   BMI 28.06 kg/m   BP Readings from Last 3 Encounters:  11/16/20 118/76  05/17/20 122/74  09/01/19 122/75    Wt Readings from Last 3 Encounters:  11/16/20 158 lb  6.4 oz (71.8 kg)  05/17/20 159 lb 6.4 oz (72.3 kg)  09/01/19 164 lb 3.2 oz (74.5 kg)    General appearance: alert, cooperative and appears stated age Ears: normal TM's and external ear canals both ears Throat: lips, mucosa, and tongue normal; teeth and gums normal Neck: no adenopathy, no carotid bruit, supple, symmetrical, trachea midline and thyroid not enlarged, symmetric, no tenderness/mass/nodules Back: symmetric, no curvature. ROM normal. No CVA tenderness. Lungs: clear to auscultation bilaterally Heart: regular rate and rhythm, S1, S2 normal, no murmur, click, rub or gallop Abdomen: soft, non-tender; bowel sounds normal; no masses,  no organomegaly Pulses: 2+ and symmetric Skin: Skin color, texture, turgor normal. No rashes or lesions Lymph nodes: Cervical, supraclavicular, and axillary nodes normal.  Lab Results  Component Value Date   HGBA1C 5.1 11/16/2020   HGBA1C 5.1 06/17/2016    Lab Results  Component Value Date   CREATININE 0.87 11/16/2020   CREATININE 0.95 05/17/2020   CREATININE 0.83 07/21/2019    Lab Results  Component Value Date   WBC 6.3 05/17/2020   HGB 14.3 05/17/2020   HCT 41.5 05/17/2020   PLT 287.0 05/17/2020   GLUCOSE 84 11/16/2020   CHOL 205 (H) 11/16/2020   TRIG 82.0 11/16/2020   HDL 99.30 11/16/2020   LDLDIRECT 127.3 07/05/2013   LDLCALC 90 11/16/2020   ALT 17 11/16/2020   AST 18 11/16/2020   NA 139 11/16/2020   K 4.1 11/16/2020   CL 102 11/16/2020   CREATININE 0.87 11/16/2020   BUN 17 11/16/2020   CO2 30 11/16/2020   TSH 2.31 11/16/2020   HGBA1C 5.1 11/16/2020   MICROALBUR 1.3 06/17/2016    DG Bone Density  Result Date: 07/18/2020 EXAM: DUAL X-RAY ABSORPTIOMETRY (DXA) FOR BONE MINERAL DENSITY IMPRESSION: Your patient Tammy Tate completed a BMD test on 07/18/2020 using the [employer] (software version: 14.10) manufactured by [employer]. The following summarizes the results of our evaluation. Technologist:  Tammy Tate PATIENT BIOGRAPHICAL: Name: Tammy Tate Patient ID: [id number] Birth Date: 28-Apr-1941 Height: 61.5 in. Gender: Female Exam Date: 07/18/2020 Weight: 157.2 lbs. Indications: Advanced Age, Caucasian, Family Hx of Osteoporosis, History of Fracture (Adult), Hypothyroid, Hysterectomy, Postmenopausal, Scoliosis Fractures: Left hand Treatments: Levothyroxine, Multi-Vitamin with calcium, Vitamin D DENSITOMETRY RESULTS: Site         Region     Measured Date Measured Age WHO Classification Young Adult T-score BMD         %Change vs. Previous Significant Change (*) DualFemur Neck Left 07/18/2020 78.8 Osteopenia -1.3 0.859 g/cm2 -4.9% - DualFemur Neck Left 01/17/2016 74.3 Normal -1.0 0.903 g/cm2 - - DualFemur Total Mean 07/18/2020 78.8 Normal -0.5 0.950 g/cm2 -3.9% Yes DualFemur Total Mean 01/17/2016 74.3 Normal -0.1 0.989 g/cm2 - - Left Forearm Radius 33% 07/18/2020 78.8 Normal -0.8 0.802 g/cm2 - - ASSESSMENT: The BMD measured at Femur Neck Left is 0.859 g/cm2 with a T-score of -1.3. This patient  is considered OSTEOPENIC according to [facility]) criteria. The scan quality is good. Lumbar spine was not utilized due to advanced degenerative changes/scoliosis. Compared with prior study, there has been a significant decrease in the total hip. World Pharmacologist [facility]) criteria for post-menopausal, Caucasian Women: Normal:                   T-score at or above -1 SD Osteopenia/low bone mass: T-score between -1 and -2.5 SD Osteoporosis:             T-score at or below -2.5 SD RECOMMENDATIONS: 1. All patients should optimize calcium and vitamin D intake. 2. Consider FDA-approved medical therapies in postmenopausal women and men aged 91 years and older, based on the following: a. A hip or vertebral(clinical or morphometric) fracture b. T-score < -2.5 at the femoral neck or spine after appropriate evaluation to exclude secondary causes c. Low bone mass (T-score between -1.0 and -2.5 at the femoral neck  or spine) and a 10-year probability of a hip fracture > 3% or a 10-year probability of a major osteoporosis-related fracture > 20% based on the [place]-adapted WHO algorithm 3. Clinician judgment and/or patient preferences may indicate treatment for people with 10-year fracture probabilities above or below these levels FOLLOW-UP: People with diagnosed cases of osteoporosis or at high risk for fracture should have regular bone mineral density tests. For patients eligible for Medicare, routine testing is allowed once every 2 years. The testing frequency can be increased to one year for patients who have rapidly progressing disease, those who are receiving or discontinuing medical therapy to restore bone mass, or have additional risk factors. I have reviewed this report, and agree with the above findings. Mark A. Thornton Papas, M.D. [facility] Dear Tammy Derrel Nip, Your patient Juanetta Gosling Palos completed a FRAX assessment on 07/18/2020 using the [employer] (analysis version: 14.10) manufactured by [employer]. The following summarizes the results of our evaluation. PATIENT BIOGRAPHICAL: Name: Aviella, Disbrow Patient ID: [id number] Birth Date: 1941/07/27 Height:    61.5 in. Gender:     [age]    [age]       Weight:    157.2 lbs. Ethnicity:  White                            Exam Date: 07/18/2020 FRAX* RESULTS:  (version: 3.5) 10-year Probability of Fracture1 Major Osteoporotic Fracture2 Hip Fracture 17.6% 3.3% Population: [place] (Caucasian) Risk Factors: History of Fracture (Adult) Based on Femur (Left) Neck BMD 1 -The 10-year probability of fracture may be lower than reported if the patient has received treatment. 2 -Major Osteoporotic Fracture: Clinical Spine, Forearm, Hip or Shoulder *FRAX is a Materials engineer of the [employer] for Metabolic Bone Disease, a [place] (WHO) [facility]. ASSESSMENT: The probability of a major osteoporotic fracture  is 17.6% within the next ten years. The probability of a hip fracture is 3.3% within the next ten years. I have reviewed this report and agree with the above findings. Mark A. Thornton Papas, M.D. [facility] Electronically Signed   By: Lavonia Dana M.D.   On: 07/18/2020 10:46    Assessment & Plan:   Problem List Items Addressed This Visit      Unprioritized   Arthritis, degenerative    Symptoms controlled with stretching,  Prn aleve,  And yoga  Cataracts, bilateral    Mild, with eary macular degeneration       Chronic insomnia    Managed with prn ambien. The risks and benefits of hypnotic use by the elderly were reviewedwith patient today including excessive sedation , confusion, ,  impaired thinking/driving, and addiction.  Patient was advised to try using melatonin  At dinnertime       HLD (hyperlipidemia)    secondar yto high HDL with HDL:LDL ratio >1 .   No treatment advised.    Lab Results  Component Value Date   CHOL 205 (H) 11/16/2020   HDL 99.30 11/16/2020   LDLCALC 90 11/16/2020   LDLDIRECT 127.3 07/05/2013   TRIG 82.0 11/16/2020   CHOLHDL 2 11/16/2020         Relevant Orders   Lipid panel (Completed)   Hypothyroidism - Primary    TSH is therapeutic,  Continue current levothyoxine dose,  Recheck in 6 montths   Lab Results  Component Value Date   TSH 2.31 11/16/2020         Relevant Orders   TSH (Completed)   Iron deficiency anemia    Secondary to blood donation .  Pre donation hgb was 15 per patient recently         Other Visit Diagnoses    Weight gain       Relevant Orders   Comprehensive metabolic panel (Completed)   Hemoglobin A1c (Completed)   COVID-19 vaccine administered       Relevant Orders   SARS-CoV-2 Semi-Quantitative Total Antibody, Spike     I provided  30 minutes of  face-to-face time during this encounter reviewing patient's current problems and past surgeries, labs and imaging studies, providing counseling on the above  mentioned problems , and coordination  of care .  I have discontinued Tammy S. Clemon "Raye"'s Atrovent HFA, predniSONE, and zolpidem. I am also having her start on ALPRAZolam. Additionally, I am having her maintain her Omega-3 Fatty Acids (OMEGA 3 PO), vitamin B-12, Biotin, Multiple Vitamins-Minerals (PRESERVISION AREDS 2 PO), Turmeric, and levothyroxine.  Meds ordered this encounter  Medications  . ALPRAZolam (XANAX) 0.25 MG tablet    Sig: Take 1 tablet (0.25 mg total) by mouth 2 (two) times daily as needed for anxiety.    Dispense:  20 tablet    Refill:  0    Medications Discontinued During This Encounter  Medication Reason  . ipratropium (ATROVENT HFA) 17 MCG/ACT inhaler   . predniSONE (DELTASONE) 10 MG tablet   . zolpidem (AMBIEN) 10 MG tablet     Follow-up: Return in about 6 months (around 05/16/2021).   Crecencio Mc, MD

## 2020-11-16 NOTE — Patient Instructions (Addendum)
I recommend trying melatonin for your insomnia.  It is not a sedative,  But must be taken on  a regular basis to help your internal clock.  Take every evening after dinner start with 3 mg dose   Max effective dose is 10 mg   INSTEAD OF TAKING AMBIEN ,  Do not take anything at bedtime.  You may try the alprazolam if you cannot return to sleep after your 2:30 wakeup

## 2020-11-18 DIAGNOSIS — H269 Unspecified cataract: Secondary | ICD-10-CM | POA: Insufficient documentation

## 2020-11-18 NOTE — Assessment & Plan Note (Signed)
Mild, with eary macular degeneration

## 2020-11-18 NOTE — Assessment & Plan Note (Signed)
secondar yto high HDL with HDL:LDL ratio >1 .   No treatment advised.    Lab Results  Component Value Date   CHOL 205 (H) 11/16/2020   HDL 99.30 11/16/2020   LDLCALC 90 11/16/2020   LDLDIRECT 127.3 07/05/2013   TRIG 82.0 11/16/2020   CHOLHDL 2 11/16/2020

## 2020-11-18 NOTE — Assessment & Plan Note (Signed)
Symptoms controlled with stretching,  Prn aleve,  And yoga

## 2020-11-18 NOTE — Assessment & Plan Note (Signed)
Managed with prn ambien. The risks and benefits of hypnotic use by the elderly were reviewedwith patient today including excessive sedation , confusion, ,  impaired thinking/driving, and addiction.  Patient was advised to try using melatonin  At dinnertime

## 2020-11-18 NOTE — Assessment & Plan Note (Signed)
TSH is therapeutic,  Continue current levothyoxine dose,  Recheck in 6 montths   Lab Results  Component Value Date   TSH 2.31 11/16/2020

## 2020-11-19 ENCOUNTER — Other Ambulatory Visit: Payer: Self-pay

## 2020-11-19 ENCOUNTER — Ambulatory Visit
Admission: RE | Admit: 2020-11-19 | Discharge: 2020-11-19 | Disposition: A | Discharge status: Home / Self Care | Payer: Medicare HMO | Source: Ambulatory Visit | Attending: Ophthalmology | Admitting: Ophthalmology

## 2020-11-19 DIAGNOSIS — H539 Unspecified visual disturbance: Secondary | ICD-10-CM | POA: Insufficient documentation

## 2020-11-19 DIAGNOSIS — H532 Diplopia: Secondary | ICD-10-CM | POA: Diagnosis not present

## 2020-11-19 DIAGNOSIS — Z853 Personal history of malignant neoplasm of breast: Secondary | ICD-10-CM | POA: Diagnosis not present

## 2020-11-19 MED ORDER — GADOBUTROL 1 MMOL/ML IV SOLN
7.0000 mL | Freq: Once | INTRAVENOUS | Status: AC | PRN
  Administered 2020-11-19: 7 mL via INTRAVENOUS

## 2020-11-21 LAB — SARS-COV-2 SEMI-QUANTITATIVE TOTAL ANTIBODY, SPIKE: SARS COV2 AB, Total Spike Semi QN: 464 U/mL — ABNORMAL HIGH (ref <0.8)

## 2020-11-30 ENCOUNTER — Other Ambulatory Visit: Payer: Self-pay | Admitting: Internal Medicine

## 2020-12-11 DIAGNOSIS — H353131 Nonexudative age-related macular degeneration, bilateral, early dry stage: Secondary | ICD-10-CM | POA: Diagnosis not present

## 2021-01-11 DIAGNOSIS — D485 Neoplasm of uncertain behavior of skin: Secondary | ICD-10-CM | POA: Diagnosis not present

## 2021-01-11 DIAGNOSIS — B078 Other viral warts: Secondary | ICD-10-CM | POA: Diagnosis not present

## 2021-01-15 DIAGNOSIS — H353131 Nonexudative age-related macular degeneration, bilateral, early dry stage: Secondary | ICD-10-CM | POA: Diagnosis not present

## 2021-01-19 DIAGNOSIS — R200 Anesthesia of skin: Secondary | ICD-10-CM | POA: Diagnosis not present

## 2021-01-19 DIAGNOSIS — M47812 Spondylosis without myelopathy or radiculopathy, cervical region: Secondary | ICD-10-CM | POA: Diagnosis not present

## 2021-01-30 DIAGNOSIS — M47812 Spondylosis without myelopathy or radiculopathy, cervical region: Secondary | ICD-10-CM | POA: Diagnosis not present

## 2021-01-31 DIAGNOSIS — M25551 Pain in right hip: Secondary | ICD-10-CM | POA: Diagnosis not present

## 2021-01-31 DIAGNOSIS — M545 Low back pain, unspecified: Secondary | ICD-10-CM | POA: Diagnosis not present

## 2021-01-31 DIAGNOSIS — M25552 Pain in left hip: Secondary | ICD-10-CM | POA: Diagnosis not present

## 2021-01-31 DIAGNOSIS — M47812 Spondylosis without myelopathy or radiculopathy, cervical region: Secondary | ICD-10-CM | POA: Diagnosis not present

## 2021-02-22 ENCOUNTER — Telehealth: Payer: Self-pay | Admitting: Internal Medicine

## 2021-02-22 NOTE — Telephone Encounter (Signed)
FYI

## 2021-02-22 NOTE — Telephone Encounter (Signed)
That's fine.  There is no definitive treatment for Raynaud's , just avoid picking up cold objects or reaching into the refrigerator or freezer with hand unless it is gloved.  Col temperatures make the blood vessels spasm

## 2021-02-22 NOTE — Telephone Encounter (Signed)
Patient called in stated that she has been to orthopedic  doctor about her right hand it has been getting numb and turning white and blue when this happens the orthopedic doctor told her that she may have raynaud's and that she could call her pcp  they could take care of this it has been going on for months now, made her an appointment for 7/12 at 1:30pm

## 2021-02-23 NOTE — Telephone Encounter (Signed)
Mychart message has been sent in regards to message below.

## 2021-03-01 ENCOUNTER — Other Ambulatory Visit: Payer: Self-pay | Admitting: Internal Medicine

## 2021-03-06 ENCOUNTER — Ambulatory Visit (INDEPENDENT_AMBULATORY_CARE_PROVIDER_SITE_OTHER): Payer: Medicare HMO

## 2021-03-06 ENCOUNTER — Other Ambulatory Visit: Payer: Self-pay

## 2021-03-06 VITALS — BP 136/83 | HR 64 | Resp 14 | Ht 63.0 in | Wt 156.4 lb

## 2021-03-06 DIAGNOSIS — Z0000 Encounter for general adult medical examination without abnormal findings: Secondary | ICD-10-CM

## 2021-03-06 NOTE — Progress Notes (Addendum)
Subjective:   Tammy Tate is a 80 y.o. female who presents for Medicare Annual (Subsequent) preventive examination.  Review of Systems    No ROS.  Medicare Wellness  Cardiac Risk Factors include: advanced age (>45men, >30 women)     Objective:    Today's Vitals   03/06/21 0954  BP: 136/83  Pulse: 64  Resp: 14  SpO2: 100%  Weight: 156 lb 6.4 oz (70.9 kg)  Height: 5\' 3"  (1.6 m)   Body mass index is 27.71 kg/m.  Advanced Directives 03/06/2021 06/10/2019 09/08/2018 09/02/2017 06/17/2016 02/09/2015  Does Patient Have a Medical Advance Directive? Yes Yes Yes Yes Yes Yes  Type of Paramedic of [place];Living will Living will [place];Living will Living will;Healthcare Power of [place];Living will Living will;Healthcare Power of [place];Advance instruction for mental health treatment  Does patient want to make changes to medical advance directive? No - Patient declined - No - Patient declined No - Patient declined - Yes - information given  Copy of [place] in Chart? No - copy requested - Yes - validated most recent copy scanned in chart (See row information) No - copy requested No - copy requested No - copy requested    Current Medications (verified) Outpatient Encounter Medications as of 03/06/2021  Medication Sig  . Calcium-Magnesium-Vitamin D (CALCIUM 1200+D3 PO) Take 1 tablet by mouth. Ca 1200mg   D3 51mcg  . ALPRAZolam (XANAX) 0.25 MG tablet Take 1 tablet (0.25 mg total) by mouth 2 (two) times daily as needed for anxiety.  . Biotin 5 MG TABS Take by mouth.  . levothyroxine (SYNTHROID) 88 MCG tablet TAKE ONE TABLET BY MOUTH EVERY MORNING AT LEAST 30 TO 60 MINUTES BEFORE BREAKFAST **TAKE ON AN EMPTY STOMACH WITH A GLASS OF WATER**  . Multiple Vitamins-Minerals (PRESERVISION AREDS 2 PO)   . Omega-3 Fatty Acids (OMEGA 3 PO) Take by mouth daily.  . Turmeric 500 MG CAPS Take 2 capsules by  mouth daily.   . vitamin B-12 (CYANOCOBALAMIN) 1000 MCG tablet Take 3,000 mcg by mouth daily.    No facility-administered encounter medications on file as of 03/06/2021.    Allergies (verified) Patient has no known allergies.   History: Past Medical History:  Diagnosis Date  . Allergy   . B12 deficiency   . Bronchitis    hx of recurrent and bronchospasm  . Cancer [facility]) 2010   DCIS , right  . Chicken pox   . Degenerative arthritis of lumbar spine   . Degenerative disk disease    KAM 08-04-2007  . GERD (gastroesophageal reflux disease)   . Migraine with aura   . Thyroid disease   . Vasomotor rhinitis    Past Surgical History:  Procedure Laterality Date  . ABDOMINAL HYSTERECTOMY     with prior HRT  . APPENDECTOMY    . COLONOSCOPY WITH PROPOFOL N/A 06/10/2019   Procedure: COLONOSCOPY WITH PROPOFOL;  Surgeon: Jonathon Bellows, MD;  Location: [facility] END[facility]COPY;  Service: Gastroenterology;  Laterality: N/A;  . ESOPHAGOGASTRODUODENOSCOPY (EGD) WITH PROPOFOL N/A 06/10/2019   Procedure: ESOPHAGOGASTRODUODENOSCOPY (EGD) WITH PROPOFOL;  Surgeon: Jonathon Bellows, MD;  Location: [facility] ENDOSCOPY;  Service: Gastroenterology;  Laterality: N/A;  . MODIFIED RADICAL MASTECTOMY W/ AXILLARY LYMPH NODE DISSECTION W/ PECTORALIS MINOR PRESERVATION  2010   bilateral, for DCIS,  [name] , [facility]   Family History  Problem Relation Age of Onset  . Cancer Mother 9       esophgeal  CA mets to lungs, spine   . Stroke Father 35       tobacco abuse, hyperlipidemia  . Hyperlipidemia Father   . Heart disease Father   . Diabetes Maternal Grandfather   . Pancreatitis Daughter    Social History   Socioeconomic History  . Marital status: Married    Spouse name: Not on file  . Number of children: Not on file  . Years of education: Not on file  . Highest education level: Not on file  Occupational History  . Not on file  Tobacco Use  . Smoking status: Former Smoker    Quit date: 10/23/1971    Years since quitting:  49.4  . Smokeless tobacco: Never Used  Substance and Sexual Activity  . Alcohol use: Yes    Alcohol/week: 7.0 standard drinks    Types: 7 Glasses of wine per week    Comment: occasional  . Drug use: No  . Sexual activity: Not Currently  Other Topics Concern  . Not on file  Social History Narrative  . Not on file   Social Determinants of Health   Financial Resource Strain: Low Risk   . Difficulty of Paying Living Expenses: Not hard at all  Food Insecurity: No Food Insecurity  . Worried About Charity fundraiser in the Last Year: Never true  . Ran Out of Food in the Last Year: Never true  Transportation Needs: No Transportation Needs  . Lack of Transportation (Medical): No  . Lack of Transportation (Non-Medical): No  Physical Activity: Insufficiently Active  . Days of Exercise per Week: 2 days  . Minutes of Exercise per Session: 20 min  Stress: No Stress Concern Present  . Feeling of Stress : Not at all  Social Connections: Unknown  . Frequency of Communication with Friends and Family: Not on file  . Frequency of Social Gatherings with Friends and Family: Not on file  . Attends Religious Services: More than 4 times per year  . Active Member of Clubs or Organizations: Not on file  . Attends Archivist Meetings: Not on file  . Marital Status: Married    Tobacco Counseling Counseling given: Not Answered   Clinical Intake:  Pre-visit preparation completed: Yes        Diabetes: No  How often do you need to have someone help you when you read instructions, pamphlets, or other written materials from your doctor or pharmacy?: 1 - Never   Interpreter Needed?: No      Activities of Daily Living In your present state of health, do you have any difficulty performing the following activities: 03/06/2021  Hearing? N  Vision? N  Difficulty concentrating or making decisions? N  Walking or climbing stairs? N  Dressing or bathing? N  Doing errands, shopping? N   Preparing Food and eating ? N  Using the Toilet? N  In the past six months, have you accidently leaked urine? N  Do you have problems with loss of bowel control? N  Managing your Medications? N  Managing your Finances? N  Housekeeping or managing your Housekeeping? N  Some recent data might be hidden    Patient Care Team: Crecencio Mc, MD as PCP - General (Internal Medicine)  Indicate any recent Medical Services you may have received from other than [facility] providers in the past year (date may be approximate).     Assessment:   This is a routine wellness examination for [place].  Hearing/Vision screen  Hearing Screening  125Hz  250Hz  500Hz  1000Hz  2000Hz  3000Hz  4000Hz  6000Hz  8000Hz   Right ear:           Left ear:           Comments: Patient is able to hear conversational tones without difficulty.  No issues reported.  Vision Screening Comments: Followed by [facility] Wears corrective lenses They have seen their ophthalmologist in the last 12 months.    Dietary issues and exercise activities discussed: Current Exercise Habits: Home exercise routine, Type of exercise: walking, Frequency (Times/Week): 5, Intensity: Mild  Goals Addressed              This Visit's Progress     Patient Stated   .  Weight (lb) < 155 lb (70.3 kg) (pt-stated)   156 lb 6.4 oz (70.9 kg)     7,000 steps daily Healthy diet      Depression Screen PHQ 2/9 Scores 03/06/2021 11/16/2020 05/17/2020 09/08/2018 09/02/2017 06/17/2016 03/09/2015  PHQ - 2 Score 0 0 0 0 0 0 0  PHQ- 9 Score - 0 - - - - -    Fall Risk Fall Risk  03/06/2021 11/16/2020 05/17/2020 09/08/2018 09/02/2017  Falls in the past year? 0 0 1 1 No  Number falls in past yr: 0 - 0 - -  Injury with Fall? 0 - 0 - -  Follow up Falls evaluation completed Falls evaluation completed Falls evaluation completed - -    FALL RISK PREVENTION PERTAINING TO THE HOME: Handrails in use when climbing stairs? Yes Home free of loose throw rugs in  walkways, pet beds, electrical cords, etc? Yes  Adequate lighting in your home to reduce risk of falls? Yes   ASSISTIVE DEVICES UTILIZED TO PREVENT FALLS: Life alert? No  Use of a cane, walker or w/c? No   TIMED UP AND GO: Was the test performed? Yes .  Length of time to ambulate 10 feet: 10 sec.   Gait steady and fast without use of assistive device  Cognitive Function: Patient is alert and oriented x3.   Denies difficulty focusing, making decisions, memory loss.  Enjoys brain health activities. MMSE/6CIT deferred. Normal by direct communication/observation.   MMSE - Mini Mental State Exam 09/08/2018 06/17/2016  Orientation to time 5 5  Orientation to Place 5 5  Registration 3 3  Attention/ Calculation 5 5  Recall 2 3  Language- name 2 objects 2 2  Language- repeat 1 1  Language- follow 3 step command 3 3  Language- read & follow direction 1 1  Write a sentence 1 1  Copy design 1 1  Total score 29 30     6CIT Screen 09/02/2017  What Year? 0 points  What month? 0 points  What time? 0 points  Count back from 20 0 points  Months in reverse 0 points  Repeat phrase 0 points  Total Score 0    Immunizations Immunization History  Administered Date(s) Administered  . Fluad Quad(high Dose 65+) 07/02/2019  . Influenza Split 07/02/2013, 07/11/2014  . Influenza, High Dose Seasonal PF 06/17/2016, 06/26/2017, 07/15/2018, 05/19/2020  . Influenza-Unspecified 07/02/2013  . PFIZER(Purple Top)SARS-COV-2 Vaccination 10/07/2019, 10/28/2019, 06/27/2020  . Pneumococcal Conjugate-13 03/02/2014  . Pneumococcal Polysaccharide-23 03/03/2007, 03/19/2018  . Tdap 02/25/2009, 05/19/2020  . Zoster Recombinat (Shingrix) 12/19/2017, 04/16/2018  . Zoster, Live 03/27/2011   Health Maintenance  Health Maintenance  Topic Date Due  . COVID-19 Vaccine (4 - Booster for D[place]el series) 03/22/2021 (Originally 09/26/2020)  . Pneumococcal Vaccine 24-26 Years old (  1 of 4 - PCV13) 03/30/2021 (Originally  09/29/1947)  . INFLUENZA VACCINE  04/30/2021  . TETANUS/TDAP  05/19/2030  . DEXA SCAN  Completed  . Hepatitis C Screening  Completed  . PNA vac Low Risk Adult  Completed  . Zoster Vaccines- Shingrix  Completed  . HPV VACCINES  Aged Out   Colorectal cancer screening: No longer required.   Lung Cancer Screening: (Low Dose CT Chest recommended if Age 75-80 years, 30 pack-year currently smoking OR have quit w/in 15years.) does not qualify.   Vision Screening: Recommended annual ophthalmology exams for early detection of glaucoma and other disorders of the eye. Is the patient up to date with their annual eye exam?  Yes   Dental Screening: Recommended annual dental exams for proper oral hygiene  Community Resource Referral / Chronic Care Management: CRR required this visit?  No   CCM required this visit?  No      Plan:   Keep all routine maintenance appointments.   I have personally reviewed and noted the following in the patient's chart:   . Medical and social history . Use of alcohol, tobacco or illicit drugs  . Current medications and supplements including opioid prescriptions. Patient is not currently taking opioids.  . Functional ability and status . Nutritional status . Physical activity . Advanced directives . List of other physicians . Hospitalizations, surgeries, and ER visits in previous 12 months . Vitals . Screenings to include cognitive, depression, and falls . Referrals and appointments  In addition, I have reviewed and discussed with patient certain preventive protocols, quality metrics, and best practice recommendations. A written personalized care plan for preventive services as well as general preventive health recommendations were provided to patient.     Varney Biles, LPN   [id number]

## 2021-03-06 NOTE — Patient Instructions (Addendum)
Tammy Tate , Thank you for taking time to come for your Medicare Wellness Visit. I appreciate your ongoing commitment to your health goals. Please review the following plan we discussed and let me know if I can assist you in the future.   These are the goals we discussed: Goals      Patient Stated   .  Weight (lb) < 155 lb (70.3 kg) (pt-stated)      7,000 steps daily Healthy diet       This is a list of the screening recommended for you and due dates:  Health Maintenance  Topic Date Due  . COVID-19 Vaccine (4 - Booster for Pfizer series) 03/22/2021*  . Pneumococcal Vaccination (1 of 4 - PCV13) 03/30/2021*  . Flu Shot  04/30/2021  . Tetanus Vaccine  05/19/2030  . DEXA scan (bone density measurement)  Completed  . Hepatitis C Screening: USPSTF Recommendation to screen - Ages 20-79 yo.  Completed  . Pneumonia vaccines  Completed  . Zoster (Shingles) Vaccine  Completed  . HPV Vaccine  Aged Out  *Topic was postponed. The date shown is not the original due date.   Advanced directives: End of life planning; Advance aging; Advanced directives discussed.  Copy of current HCPOA/Living Will requested.    Conditions/risks identified: none new  Follow up in one year for your annual wellness visit    Preventive Care 65 Years and Older, Female Preventive care refers to lifestyle choices and visits with your health care provider that can promote health and wellness. What does preventive care include?  A yearly physical exam. This is also called an annual well check.  Dental exams once or twice a year.  Routine eye exams. Ask your health care provider how often you should have your eyes checked.  Personal lifestyle choices, including:  Daily care of your teeth and gums.  Regular physical activity.  Eating a healthy diet.  Avoiding tobacco and drug use.  Limiting alcohol use.  Practicing safe sex.  Taking low-dose aspirin every day.  Taking vitamin and mineral supplements as  recommended by your health care provider. What happens during an annual well check? The services and screenings done by your health care provider during your annual well check will depend on your age, overall health, lifestyle risk factors, and family history of disease. Counseling  Your health care provider may ask you questions about your:  Alcohol use.  Tobacco use.  Drug use.  Emotional well-being.  Home and relationship well-being.  Sexual activity.  Eating habits.  History of falls.  Memory and ability to understand (cognition).  Work and work Statistician.  Reproductive health. Screening  You may have the following tests or measurements:  Height, weight, and BMI.  Blood pressure.  Lipid and cholesterol levels. These may be checked every 5 years, or more frequently if you are over 15 years old.  Skin check.  Lung cancer screening. You may have this screening every year starting at age 25 if you have a 30-pack-year history of smoking and currently smoke or have quit within the past 15 years.  Fecal occult blood test (FOBT) of the stool. You may have this test every year starting at age 1.  Flexible sigmoidoscopy or colonoscopy. You may have a sigmoidoscopy every 5 years or a colonoscopy every 10 years starting at age 56.  Hepatitis C blood test.  Hepatitis B blood test.  Sexually transmitted disease (STD) testing.  Diabetes screening. This is done by checking your blood  sugar (glucose) after you have not eaten for a while (fasting). You may have this done every 1-3 years.  Bone density scan. This is done to screen for osteoporosis. You may have this done starting at age 29.  Mammogram. This may be done every 1-2 years. Talk to your health care provider about how often you should have regular mammograms. Talk with your health care provider about your test results, treatment options, and if necessary, the need for more tests. Vaccines  Your health care  provider may recommend certain vaccines, such as:  Influenza vaccine. This is recommended every year.  Tetanus, diphtheria, and acellular pertussis (Tdap, Td) vaccine. You may need a Td booster every 10 years.  Zoster vaccine. You may need this after age 37.  Pneumococcal 13-valent conjugate (PCV13) vaccine. One dose is recommended after age 82.  Pneumococcal polysaccharide (PPSV23) vaccine. One dose is recommended after age 63. Talk to your health care provider about which screenings and vaccines you need and how often you need them. This information is not intended to replace advice given to you by your health care provider. Make sure you discuss any questions you have with your health care provider. Document Released: 10/13/2015 Document Revised: 06/05/2016 Document Reviewed: 07/18/2015 Elsevier Interactive Patient Education  2017 [employer] Prevention in the Home Falls can cause injuries. They can happen to people of all ages. There are many things you can do to make your home safe and to help prevent falls. What can I do on the outside of my home?  Regularly fix the edges of walkways and driveways and fix any cracks.  Remove anything that might make you trip as you walk through a door, such as a raised step or threshold.  Trim any bushes or trees on the path to your home.  Use bright outdoor lighting.  Clear any walking paths of anything that might make someone trip, such as rocks or tools.  Regularly check to see if handrails are loose or broken. Make sure that both sides of any steps have handrails.  Any raised decks and porches should have guardrails on the edges.  Have any leaves, snow, or ice cleared regularly.  Use sand or salt on walking paths during winter.  Clean up any spills in your garage right away. This includes oil or grease spills. What can I do in the bathroom?  Use night lights.  Install grab bars by the toilet and in the tub and shower. Do  not use towel bars as grab bars.  Use non-skid mats or decals in the tub or shower.  If you need to sit down in the shower, use a plastic, non-slip stool.  Keep the floor dry. Clean up any water that spills on the floor as soon as it happens.  Remove soap buildup in the tub or shower regularly.  Attach bath mats securely with double-sided non-slip rug tape.  Do not have throw rugs and other things on the floor that can make you trip. What can I do in the bedroom?  Use night lights.  Make sure that you have a light by your bed that is easy to reach.  Do not use any sheets or blankets that are too big for your bed. They should not hang down onto the floor.  Have a firm chair that has side arms. You can use this for support while you get dressed.  Do not have throw rugs and other things on the floor that can  make you trip. What can I do in the kitchen?  Clean up any spills right away.  Avoid walking on wet floors.  Keep items that you use a lot in easy-to-reach places.  If you need to reach something above you, use a strong step stool that has a grab bar.  Keep electrical cords out of the way.  Do not use floor polish or wax that makes floors slippery. If you must use wax, use non-skid floor wax.  Do not have throw rugs and other things on the floor that can make you trip. What can I do with my stairs?  Do not leave any items on the stairs.  Make sure that there are handrails on both sides of the stairs and use them. Fix handrails that are broken or loose. Make sure that handrails are as long as the stairways.  Check any carpeting to make sure that it is firmly attached to the stairs. Fix any carpet that is loose or worn.  Avoid having throw rugs at the top or bottom of the stairs. If you do have throw rugs, attach them to the floor with carpet tape.  Make sure that you have a light switch at the top of the stairs and the bottom of the stairs. If you do not have them,  ask someone to add them for you. What else can I do to help prevent falls?  Wear shoes that:  Do not have high heels.  Have rubber bottoms.  Are comfortable and fit you well.  Are closed at the toe. Do not wear sandals.  If you use a stepladder:  Make sure that it is fully opened. Do not climb a closed stepladder.  Make sure that both sides of the stepladder are locked into place.  Ask someone to hold it for you, if possible.  Clearly mark and make sure that you can see:  Any grab bars or handrails.  First and last steps.  Where the edge of each step is.  Use tools that help you move around (mobility aids) if they are needed. These include:  Canes.  Walkers.  Scooters.  Crutches.  Turn on the lights when you go into a dark area. Replace any light bulbs as soon as they burn out.  Set up your furniture so you have a clear path. Avoid moving your furniture around.  If any of your floors are uneven, fix them.  If there are any pets around you, be aware of where they are.  Review your medicines with your doctor. Some medicines can make you feel dizzy. This can increase your chance of falling. Ask your doctor what other things that you can do to help prevent falls. This information is not intended to replace advice given to you by your health care provider. Make sure you discuss any questions you have with your health care provider. Document Released: 07/13/2009 Document Revised: 02/22/2016 Document Reviewed: 10/21/2014 Elsevier Interactive Patient Education  2017 [employer].

## 2021-03-08 DIAGNOSIS — H353131 Nonexudative age-related macular degeneration, bilateral, early dry stage: Secondary | ICD-10-CM | POA: Diagnosis not present

## 2021-03-26 DIAGNOSIS — M47817 Spondylosis without myelopathy or radiculopathy, lumbosacral region: Secondary | ICD-10-CM | POA: Diagnosis not present

## 2021-04-10 ENCOUNTER — Ambulatory Visit: Payer: Medicare HMO | Admitting: Internal Medicine

## 2021-04-18 DIAGNOSIS — M5416 Radiculopathy, lumbar region: Secondary | ICD-10-CM | POA: Diagnosis not present

## 2021-04-20 DIAGNOSIS — M7061 Trochanteric bursitis, right hip: Secondary | ICD-10-CM | POA: Diagnosis not present

## 2021-04-20 DIAGNOSIS — M7062 Trochanteric bursitis, left hip: Secondary | ICD-10-CM | POA: Diagnosis not present

## 2021-04-23 DIAGNOSIS — M67814 Other specified disorders of tendon, left shoulder: Secondary | ICD-10-CM | POA: Diagnosis not present

## 2021-04-30 DIAGNOSIS — M47817 Spondylosis without myelopathy or radiculopathy, lumbosacral region: Secondary | ICD-10-CM | POA: Diagnosis not present

## 2021-05-09 DIAGNOSIS — M47817 Spondylosis without myelopathy or radiculopathy, lumbosacral region: Secondary | ICD-10-CM | POA: Diagnosis not present

## 2021-05-28 ENCOUNTER — Other Ambulatory Visit: Payer: Self-pay | Admitting: Internal Medicine

## 2021-05-28 NOTE — Telephone Encounter (Signed)
RX Refill:xanax Last Seen:11-16-20 Last ordered:11-16-20

## 2021-05-28 NOTE — Telephone Encounter (Signed)
LAST REFILL WITHOUT IN OFFICE VISIT , PLEASE SCHEDULE  

## 2021-05-29 DIAGNOSIS — M47817 Spondylosis without myelopathy or radiculopathy, lumbosacral region: Secondary | ICD-10-CM | POA: Diagnosis not present

## 2021-05-30 NOTE — Telephone Encounter (Signed)
Patient is scheduled for an appointment 06/25/21 for a follow up.

## 2021-06-08 DIAGNOSIS — R1310 Dysphagia, unspecified: Secondary | ICD-10-CM | POA: Diagnosis not present

## 2021-06-08 DIAGNOSIS — J9589 Other postprocedural complications and disorders of respiratory system, not elsewhere classified: Secondary | ICD-10-CM | POA: Diagnosis not present

## 2021-06-12 ENCOUNTER — Other Ambulatory Visit: Payer: Self-pay | Admitting: Unknown Physician Specialty

## 2021-06-12 ENCOUNTER — Telehealth: Payer: Self-pay

## 2021-06-12 DIAGNOSIS — R1310 Dysphagia, unspecified: Secondary | ICD-10-CM

## 2021-06-12 NOTE — Telephone Encounter (Signed)
Patient was contacted per Dr. Georgeann Oppenheim request after receiving a message from patient's ENT physician. Dr. Vicente Males wanted to me schedule patient an appointment to come in to see him. However, patient stated that she would give [place] a call back after she came back from [place]. I told her to call us when she had a chance. Patient agreed since she did not wanted to schedule anything right now.

## 2021-06-25 ENCOUNTER — Ambulatory Visit: Payer: Medicare HMO | Admitting: Internal Medicine

## 2021-06-27 ENCOUNTER — Ambulatory Visit
Admission: RE | Admit: 2021-06-27 | Discharge: 2021-06-27 | Disposition: A | Discharge status: Home / Self Care | Payer: Medicare HMO | Source: Ambulatory Visit | Attending: Unknown Physician Specialty | Admitting: Unknown Physician Specialty

## 2021-06-27 ENCOUNTER — Other Ambulatory Visit: Payer: Self-pay

## 2021-06-27 DIAGNOSIS — R1310 Dysphagia, unspecified: Secondary | ICD-10-CM | POA: Diagnosis not present

## 2021-06-27 DIAGNOSIS — R1312 Dysphagia, oropharyngeal phase: Secondary | ICD-10-CM | POA: Diagnosis not present

## 2021-06-27 NOTE — Therapy (Addendum)
[place], [place], [address] Phone: [phone]   Fax:     Modified Barium Swallow  Patient Details  Name: Tammy Tate MRN: [id number] Date of Birth: [birth year] No data recorded  Encounter Date: 06/27/2021   End of Session - 06/27/21 1345     Visit Number 1    Number of Visits 1    Date for SLP Re-Evaluation 06/27/21    SLP Start Time 7939    SLP Stop Time  1300    SLP Time Calculation (min) 30 min    Activity Tolerance Patient tolerated treatment well               There were no vitals filed for this visit.   Subjective Assessment - 06/27/21 1323     Subjective "I've about decided it's allergies."    Currently in Pain? No/denies             Objective Swallowing Evaluation: Type of Study: MBS-Modified Barium Swallow Study   Patient Details  Name: Tammy Tate MRN: [id number] Date of Birth: [birth year]  Today's Date: 06/27/2021 Time: SLP Start Time (ACUTE ONLY): 38 -SLP Stop Time (ACUTE ONLY): 1300  SLP Time Calculation (min) (ACUTE ONLY): 30 min   Past Medical History:  Past Medical History:  Diagnosis Date   Allergy    B12 deficiency    Bronchitis    hx of recurrent and bronchospasm   Cancer ([place]) 2010   DCIS , right   Chicken pox    Degenerative arthritis of lumbar spine    Degenerative disk disease    KAM 08-04-2007   GERD (gastroesophageal reflux disease)    Migraine with aura    Thyroid disease    Vasomotor rhinitis    Past Surgical History:  Past Surgical History:  Procedure Laterality Date   ABDOMINAL HYSTERECTOMY     with prior HRT   APPENDECTOMY     COLONOSCOPY WITH PROPOFOL N/A 06/10/2019   Procedure: COLONOSCOPY WITH PROPOFOL;  Surgeon: Jonathon Bellows, MD;  Location: [facility] ENDOSCOPY;  Service: Gastroenterology;  Laterality: N/A;   ESOPHAGOGASTRODUODENOSCOPY (EGD) WITH PROPOFOL N/A 06/10/2019   Procedure: ESOPHAGOGASTRODUODENOSCOPY (EGD)  WITH PROPOFOL;  Surgeon: Jonathon Bellows, MD;  Location: [facility] ENDOSCOPY;  Service: Gastroenterology;  Laterality: N/A;   MODIFIED RADICAL MASTECTOMY W/ AXILLARY LYMPH NODE DISSECTION W/ PECTORALIS MINOR PRESERVATION  2010   bilateral, for DCIS,  [name] , [facility]   HPI: Tammy Tate is a 80 y.o. female referred by Dr. Beverly Gust for MBS. Hx noted for GERD, breast cancer. ENT scope negative; per ENT report pt felt solids getting stuck occasionally and pt felt she may be aspirating some liquids. No recent hx of PNA. Had recent MRI brain 11/19/2020 showing unremarkable appearance of the brain for age.   Subjective: Reports some instances of coughing, throat clearing with saliva, occasionally when eating and drinking    Assessment / Plan / Recommendation  CHL IP CLINICAL IMPRESSIONS 06/27/2021  Clinical Impression Patient presents with functional oropharyngeal swallowing with noted mild age-related changes. Oral stage is characterized by adequate lip closure, bolus preparation, and anterior to posterior transit. Swallow initiation is timely at the level of the base of tongue. Pharyngeal stage is noted for adequate tongue base retraction, hyolaryngeal excursion, and pharyngeal constriction. Note appearance of bony protrustions along anterior cervical spine. As the epiglottis deflects, this results in slowed/incomplete deflection as the laryngeal surface of the epiglottis contacts the posterior pharyngeal wall,  intermittently resulting in mild coating of residue in the valleculae and above the pharyngoesophageal segment. Pt independently senses residue (localizes and points to region of PE segment when residue is present) and typically initiates a second swallow, which is largely effective in reducing residue.  Trace, shallow, flash penetration of thin liquid x1 during multiple swallows. There is no aspiration. Pharyngeal stripping wave is complete. Amplitude/duration of cricopharyngeus opening is WFL;  prominent cricopharyngeus noted. There was adequate/complete clearance through the cervical esophagus.  Consistencies tested were thin liquids x2 teaspoons, 1 cup sip, and 4 consecutive cup sips, nectar x1 cup sip, puree x2 heaping tsps, mechanical soft (1/4 graham cracker in applesauce x2), 21mm barium tablet. Reviewed study findings with patient and educated on general aspiration precautions, as well as strategies such as multiple swallows, following solids with liquids if sensing residue. Recommend patient continue regular diet with thin liquids; no further ST indicated at this time.  SLP Visit Diagnosis Dysphagia, oropharyngeal phase (R13.12)  Impact on safety and function Mild aspiration risk      CHL IP TREATMENT RECOMMENDATION 06/27/2021  Treatment Recommendations No treatment recommended at this time     No flowsheet data found.  CHL IP DIET RECOMMENDATION 06/27/2021  SLP Diet Recommendations Regular solids;Thin liquid  Liquid Administration via Cup  Medication Administration Whole meds with liquid  Compensations Slow rate;Small sips/bites;Multiple dry swallows after each bite/sip;Follow solids with liquid  Postural Changes Seated upright at 90 degrees      CHL IP OTHER RECOMMENDATIONS 06/27/2021  Recommended Consults --  Oral Care Recommendations Oral care BID  Other Recommendations --      CHL IP FOLLOW UP RECOMMENDATIONS 06/27/2021  Follow up Recommendations None      No flowsheet data found.         CHL IP ORAL PHASE 06/27/2021  Oral Phase WFL    CHL IP PHARYNGEAL PHASE 06/27/2021  Pharyngeal Phase WFL  Pharyngeal- Nectar Cup Reduced epiglottic inversion;Reduced airway/laryngeal closure;Pharyngeal residue - valleculae;Pharyngeal residue - cp segment  Pharyngeal Material does not enter airway  Pharyngeal- Nectar Straw --  Pharyngeal --  Pharyngeal- Thin Teaspoon Reduced epiglottic inversion;Reduced airway/laryngeal closure  Pharyngeal Material does not enter airway   Pharyngeal- Thin Cup Reduced epiglottic inversion;Reduced airway/laryngeal closure;Other (Comment)  Pharyngeal Material does not enter airway;Material enters airway, remains ABOVE vocal cords then ejected out  Pharyngeal- Thin Straw --  Pharyngeal --  Pharyngeal- Puree Reduced epiglottic inversion;Reduced airway/laryngeal closure;Pharyngeal residue - valleculae;Pharyngeal residue - cp segment  Pharyngeal Material does not enter airway  Pharyngeal- Mechanical Soft Reduced epiglottic inversion;Reduced airway/laryngeal closure  Pharyngeal Material does not enter airway  Pharyngeal- Pill Reduced epiglottic inversion;Reduced laryngeal elevation;Penetration/Aspiration during swallow  Pharyngeal Material does not enter airway  Pharyngeal Comment --     CHL IP CERVICAL ESOPHAGEAL PHASE 06/27/2021  Cervical Esophageal Phase [facility]   Deneise Lever, MS, CCC-SLP Speech-Language Pathologist    Aliene Altes 06/27/2021, 1:46 PM     Dysphagia, oropharyngeal phase  Dysphagia, unspecified type - Plan: DG SWALLOW FUNC OP MEDICARE SPEECH PATH, DG SWALLOW FUNC OP MEDICARE SPEECH PATH        Problem List Patient Active Problem List   Diagnosis Date Noted   Cataracts, bilateral 11/18/2020   History of vertebral fracture 05/17/2020   Iron deficiency anemia    Polyp of colon    Chronic insomnia 07/16/2018   Lumbar spinal stenosis 11/07/2017   Degenerative lumbar disc 04/04/2016   Visit for preventive health examination 03/11/2016   Acid reflux  12/26/2015   HLD (hyperlipidemia) 12/26/2015   Arthritis, degenerative 12/26/2015   Polyarthritis of hand 11/21/2015   Macrocytosis without anemia 03/05/2014   S/P breast reconstruction, bilateral 03/05/2014   Overweight (BMI 25.0-29.9) 03/05/2014   Subacromial bursitis 07/06/2013   S/P bilateral mastectomy 04/07/2013   Esophagitis, reflux 02/22/2012   DCIS (ductal carcinoma in situ) of breast, right    Encounter for Medicare annual wellness  exam 02/19/2012   Allergic rhinitis 10/24/2011   Screening for osteoporosis 10/23/2011   Screening for colon cancer 10/23/2011   Hypothyroidism 10/23/2011   B12 deficiency     Aliene Altes 06/27/2021, 1:46 PM  [place], [place], [address] Phone: [phone]   Fax:     Name: Tammy Tate MRN: [id number] Date of Birth: July 04, 1941

## 2021-07-04 DIAGNOSIS — M47817 Spondylosis without myelopathy or radiculopathy, lumbosacral region: Secondary | ICD-10-CM | POA: Diagnosis not present

## 2021-07-11 DIAGNOSIS — M5033 Other cervical disc degeneration, cervicothoracic region: Secondary | ICD-10-CM | POA: Diagnosis not present

## 2021-07-11 DIAGNOSIS — M5412 Radiculopathy, cervical region: Secondary | ICD-10-CM | POA: Diagnosis not present

## 2021-07-11 DIAGNOSIS — M9902 Segmental and somatic dysfunction of thoracic region: Secondary | ICD-10-CM | POA: Diagnosis not present

## 2021-07-11 DIAGNOSIS — M9901 Segmental and somatic dysfunction of cervical region: Secondary | ICD-10-CM | POA: Diagnosis not present

## 2021-07-12 DIAGNOSIS — M5033 Other cervical disc degeneration, cervicothoracic region: Secondary | ICD-10-CM | POA: Diagnosis not present

## 2021-07-12 DIAGNOSIS — M5412 Radiculopathy, cervical region: Secondary | ICD-10-CM | POA: Diagnosis not present

## 2021-07-12 DIAGNOSIS — M9902 Segmental and somatic dysfunction of thoracic region: Secondary | ICD-10-CM | POA: Diagnosis not present

## 2021-07-12 DIAGNOSIS — M9901 Segmental and somatic dysfunction of cervical region: Secondary | ICD-10-CM | POA: Diagnosis not present

## 2021-08-01 ENCOUNTER — Encounter: Payer: Self-pay | Admitting: Internal Medicine

## 2021-08-01 ENCOUNTER — Ambulatory Visit (INDEPENDENT_AMBULATORY_CARE_PROVIDER_SITE_OTHER): Payer: Medicare HMO | Admitting: Internal Medicine

## 2021-08-01 ENCOUNTER — Other Ambulatory Visit: Payer: Self-pay

## 2021-08-01 VITALS — BP 112/68 | HR 68 | Temp 96.6°F | Ht 63.0 in | Wt 153.2 lb

## 2021-08-01 DIAGNOSIS — D7589 Other specified diseases of blood and blood-forming organs: Secondary | ICD-10-CM

## 2021-08-01 DIAGNOSIS — E034 Atrophy of thyroid (acquired): Secondary | ICD-10-CM | POA: Diagnosis not present

## 2021-08-01 DIAGNOSIS — M85859 Other specified disorders of bone density and structure, unspecified thigh: Secondary | ICD-10-CM | POA: Insufficient documentation

## 2021-08-01 DIAGNOSIS — E538 Deficiency of other specified B group vitamins: Secondary | ICD-10-CM

## 2021-08-01 DIAGNOSIS — R69 Illness, unspecified: Secondary | ICD-10-CM | POA: Diagnosis not present

## 2021-08-01 DIAGNOSIS — M9901 Segmental and somatic dysfunction of cervical region: Secondary | ICD-10-CM | POA: Diagnosis not present

## 2021-08-01 DIAGNOSIS — M5412 Radiculopathy, cervical region: Secondary | ICD-10-CM | POA: Diagnosis not present

## 2021-08-01 DIAGNOSIS — M85852 Other specified disorders of bone density and structure, left thigh: Secondary | ICD-10-CM

## 2021-08-01 DIAGNOSIS — Z23 Encounter for immunization: Secondary | ICD-10-CM | POA: Diagnosis not present

## 2021-08-01 DIAGNOSIS — M48061 Spinal stenosis, lumbar region without neurogenic claudication: Secondary | ICD-10-CM

## 2021-08-01 DIAGNOSIS — M9902 Segmental and somatic dysfunction of thoracic region: Secondary | ICD-10-CM | POA: Diagnosis not present

## 2021-08-01 DIAGNOSIS — Z79899 Other long term (current) drug therapy: Secondary | ICD-10-CM | POA: Diagnosis not present

## 2021-08-01 DIAGNOSIS — F5104 Psychophysiologic insomnia: Secondary | ICD-10-CM

## 2021-08-01 DIAGNOSIS — R944 Abnormal results of kidney function studies: Secondary | ICD-10-CM | POA: Diagnosis not present

## 2021-08-01 DIAGNOSIS — M5033 Other cervical disc degeneration, cervicothoracic region: Secondary | ICD-10-CM | POA: Diagnosis not present

## 2021-08-01 NOTE — Progress Notes (Signed)
Subjective:  Patient ID: Tammy Tate, female    DOB: 03/27/1941  Age: 80 y.o. MRN: [id number]  CC: The primary encounter diagnosis was Hypothyroidism due to acquired atrophy of thyroid. Diagnoses of Long-term use of high-risk medication, Macrocytosis without anemia, Osteopenia of neck of left femur, Spinal stenosis of lumbar region without neurogenic claudication, Chronic insomnia, and B12 deficiency were also pertinent to this visit.  HPI Tammy Tate presents for follow up on hypothyroidism and insomnia managed with alprazolam  Chief Complaint  Patient presents with   Follow-up    Follow up on hypothyroidism, and medication refill    This visit occurred during the SARS-CoV-2 public health emergency.  Safety protocols were in place, including screening questions prior to the visit, additional usage of staff PPE, and extensive cleaning of exam room while observing appropriate contact time as indicated for disinfecting solutions.   insomnia:  last refill of alprazolam august 29 .  #20 does not use daily ,   averages 1-2 per week for ear lry wakeups   Had several trips this summer, just returned from [place] and prior to that toured the [place] .  Lots of walking.  Back and hips feel fine , no issues.    Seeing Williams-toone, MD   at [facility] for pain management Lumbosacral spondylosis   Had a procedure done before her trip hat has resolved her back pain   History of dipolopia in Feb: had MRI brain donse by [facility] due to history of BRCA.  Normal.  Symptoms resolved.   Outpatient Medications Prior to Visit  Medication Sig Dispense Refill   ALPRAZolam (XANAX) 0.25 MG tablet TAKE ONE TABLET BY MOUTH TWICE A DAY AS NEEDED FOR ANXIETYT 20 tablet 0   Biotin 5 MG TABS Take by mouth.     Calcium-Magnesium-Vitamin D (CALCIUM 1200+D3 PO) Take 1 tablet by mouth. Ca $RemoveB'1200mg'HQnEcWDQ$   D3 33mcg     levothyroxine (SYNTHROID) 88 MCG tablet TAKE ONE TABLET BY MOUTH EVERY MORNING AT  LEAST 30-60 MINUTES BEFORE BREAKFAST. TAKE ON AN EMPTY STOMACH WITH A GLASS OF WATER 90 tablet 0   Multiple Vitamins-Minerals (PRESERVISION AREDS 2 PO)      Omega-3 Fatty Acids (OMEGA 3 PO) Take by mouth daily.     Turmeric 500 MG CAPS Take 2 capsules by mouth daily.      vitamin B-12 (CYANOCOBALAMIN) 1000 MCG tablet Take 3,000 mcg by mouth daily.  (Patient not taking: Reported on 08/01/2021)     No facility-administered medications prior to visit.    Review of Systems;  Patient denies headache, fevers, malaise, unintentional weight loss, skin rash, eye pain, sinus congestion and sinus pain, sore throat, dysphagia,  hemoptysis , cough, dyspnea, wheezing, chest pain, palpitations, orthopnea, edema, abdominal pain, nausea, melena, diarrhea, constipation, flank pain, dysuria, hematuria, urinary  Frequency, nocturia, numbness, tingling, seizures,  Focal weakness, Loss of consciousness,  Tremor, insomnia, depression, anxiety, and suicidal ideation.      Objective:  BP 112/68 (BP Location: Left Arm, Patient Position: Sitting, Cuff Size: Normal)   Pulse 68   Temp (!) 96.6 F (35.9 C) (Temporal)   Ht $R'5\' 3"'qh$  (1.6 m)   Wt 153 lb 3.2 oz (69.5 kg)   SpO2 98%   BMI 27.14 kg/m   BP Readings from Last 3 Encounters:  08/01/21 112/68  03/06/21 136/83  11/16/20 118/76    Wt Readings from Last 3 Encounters:  08/01/21 153 lb 3.2 oz (69.5 kg)  03/06/21 156 lb  6.4 oz (70.9 kg)  11/16/20 158 lb 6.4 oz (71.8 kg)    General appearance: alert, cooperative and appears stated age Ears: normal TM's and external ear canals both ears Throat: lips, mucosa, and tongue normal; teeth and gums normal Neck: no adenopathy, no carotid bruit, supple, symmetrical, trachea midline and thyroid not enlarged, symmetric, no tenderness/mass/nodules Back: symmetric, no curvature. ROM normal. No CVA tenderness. Lungs: clear to auscultation bilaterally Heart: regular rate and rhythm, S1, S2 normal, no murmur, click, rub or  gallop Abdomen: soft, non-tender; bowel sounds normal; no masses,  no organomegaly Pulses: 2+ and symmetric Skin: Skin color, texture, turgor normal. No rashes or lesions Lymph nodes: Cervical, supraclavicular, and axillary nodes normal.  Lab Results  Component Value Date   HGBA1C 5.1 11/16/2020   HGBA1C 5.1 06/17/2016    Lab Results  Component Value Date   CREATININE 0.87 11/16/2020   CREATININE 0.95 05/17/2020   CREATININE 0.83 07/21/2019    Lab Results  Component Value Date   WBC 6.3 05/17/2020   HGB 14.3 05/17/2020   HCT 41.5 05/17/2020   PLT 287.0 05/17/2020   GLUCOSE 84 11/16/2020   CHOL 205 (H) 11/16/2020   TRIG 82.0 11/16/2020   HDL 99.30 11/16/2020   LDLDIRECT 127.3 07/05/2013   LDLCALC 90 11/16/2020   ALT 17 11/16/2020   AST 18 11/16/2020   NA 139 11/16/2020   K 4.1 11/16/2020   CL 102 11/16/2020   CREATININE 0.87 11/16/2020   BUN 17 11/16/2020   CO2 30 11/16/2020   TSH 2.31 11/16/2020   HGBA1C 5.1 11/16/2020   MICROALBUR 1.3 06/17/2016    DG SWALLOW FUNC OP MEDICARE SPEECH PATH  Result Date: 06/27/2021 Table formatting from the original result was not included. CLINICAL DATA:  Dysphagia, unspecified type. EXAM: MODIFIED BARIUM SWALLOW TECHNIQUE: Different consistencies of barium were administered orally to the patient by the Speech Pathologist. Imaging of the pharynx was performed in the lateral projection. The radiologist was present in the fluoroscopy room for this study, providing personal supervision. FLUOROSCOPY TIME:  Fluoroscopy Time:  1 minute, 24 seconds. Radiation Exposure Index (if provided by the fluoroscopic device): 11.90 mGy COMPARISON:  Report from esophagram 08/21/2001 (images unavailable). FINDINGS: Different consistencies of barium were administered orally to the patient by the Speech Pathologist with fluoroscopic imaging of the pharynx from a lateral projection. Tsosie Billing, PA was present in the fluoroscopy room for this study, providing  personal supervision. The Speech Pathologist did not observe aspiration. Irregular contour of the anterior hypopharynx/upper cervical esophagus at the C5-C6 level (best appreciated on series 10, image 85). Prominent cricopharyngeus muscle. Please refer to the Speech Pathologist's report for full details. Impression #2 will be called to the ordering clinician or representative by the Radiologist Assistant, and communication documented in the PACS or [employer]. IMPRESSION: Modified barium swallow, as described. No aspiration was observed by the Speech Pathologist. Irregular contour of the anterior hypopharynx/upper cervical esophagus at the C5-C6 level. Endoscopy is recommended to exclude a mucosal/submucosal mass or lesion at this site. Prominent cricopharyngeus muscle. Please refer to the Speech Pathologist's report for additional details and recommendations. Electronically Signed   By: Kellie Simmering D.O.   On: 06/27/2021 13:31 CHL IP CLINICAL IMPRESSIONS 06/27/2021 Clinical Impression Patient presents with functional oropharyngeal swallowing with noted mild age-related changes. Oral stage is characterized by adequate lip closure, bolus preparation, and anterior to posterior transit. Swallow initiation is timely at the level of the base of tongue. Pharyngeal stage is noted for  adequate tongue base retraction, hyolaryngeal excursion, and pharyngeal constriction. Note appearance of bony protrustions along anterior cervical spine. As the epiglottis deflects, this results in slowed/incomplete deflection as the laryngeal surface of the epiglottis contacts the posterior pharyngeal wall, intermittently resulting in mild coating of residue in the valleculae and above the pharyngoesophageal segment. Pt independently senses residue (localizes and points to region of PE segment when residue is present) and typically initiates a second swallow, which is largely effective in reducing residue.  Trace, shallow, flash  penetration of thin liquid x1 during multiple swallows. There is no aspiration. Pharyngeal stripping wave is complete. Amplitude/duration of cricopharyngeus opening is WFL; prominent cricopharyngeus noted. There was adequate/complete clearance through the cervical esophagus.  Consistencies tested were thin liquids x2 teaspoons, 1 cup sip, and 4 consecutive cup sips, nectar x1 cup sip, puree x2 heaping tsps, mechanical soft (1/4 graham cracker in applesauce x2), 74mm barium tablet. Reviewed study findings with patient and educated on general aspiration precautions, as well as strategies such as multiple swallows, following solids with liquids if sensing residue. Recommend patient continue regular diet with thin liquids; no further ST indicated at this time. SLP Visit Diagnosis Dysphagia, oropharyngeal phase (R13.12) Impact on safety and function Mild aspiration risk Please see full SLP report in Notes. Deneise Lever, MS, CCC-SLP Speech-Language Pathologist      Assessment & Plan:   Problem List Items Addressed This Visit     B12 deficiency    Rechecking level given macrocytosis on prior CBC       Chronic insomnia    Using prn alprazolam       Hypothyroidism - Primary    Patient held biotin for 2 weeks during trip,  Took a dose today.  Lab Results  Component Value Date   TSH 2.31 11/16/2020         Relevant Orders   TSH   Lumbar spinal stenosis    Currently pain free since the procedures done in October by [facility]  Walking daily       Macrocytosis without anemia   Relevant Orders   CBC with Differential/Platelet   Vitamin B12   Osteopenia of femoral neck   Other Visit Diagnoses     Long-term use of high-risk medication       Relevant Orders   Comprehensive metabolic panel       I am having Tammy Tate "Raye" maintain her Omega-3 Fatty Acids (OMEGA 3 PO), vitamin B-12, Biotin, Multiple Vitamins-Minerals (PRESERVISION AREDS 2 PO), Turmeric,  Calcium-Magnesium-Vitamin D (CALCIUM 1200+D3 PO), levothyroxine, and ALPRAZolam.  No orders of the defined types were placed in this encounter.   There are no discontinued medications.  Follow-up: No follow-ups on file.   Crecencio Mc, MD

## 2021-08-01 NOTE — Assessment & Plan Note (Signed)
Rechecking level given macrocytosis on prior CBC

## 2021-08-01 NOTE — Assessment & Plan Note (Signed)
Using prn alprazolam

## 2021-08-01 NOTE — Assessment & Plan Note (Signed)
Patient held biotin for 2 weeks during trip,  Took a dose today.  Lab Results  Component Value Date   TSH 2.31 11/16/2020

## 2021-08-01 NOTE — Assessment & Plan Note (Signed)
Currently pain free since the procedures done in October by [facility]  Walking daily

## 2021-08-02 DIAGNOSIS — M9901 Segmental and somatic dysfunction of cervical region: Secondary | ICD-10-CM | POA: Diagnosis not present

## 2021-08-02 DIAGNOSIS — M5412 Radiculopathy, cervical region: Secondary | ICD-10-CM | POA: Diagnosis not present

## 2021-08-02 DIAGNOSIS — M9902 Segmental and somatic dysfunction of thoracic region: Secondary | ICD-10-CM | POA: Diagnosis not present

## 2021-08-02 DIAGNOSIS — M5033 Other cervical disc degeneration, cervicothoracic region: Secondary | ICD-10-CM | POA: Diagnosis not present

## 2021-08-02 LAB — COMPREHENSIVE METABOLIC PANEL
ALT: 18 U/L (ref 0–35)
AST: 22 U/L (ref 0–37)
Albumin: 3.7 g/dL (ref 3.5–5.2)
Alkaline Phosphatase: 85 U/L (ref 39–117)
BUN: 16 mg/dL (ref 6–23)
CO2: 26 mEq/L (ref 19–32)
Calcium: 8.8 mg/dL (ref 8.4–10.5)
Chloride: 103 mEq/L (ref 96–112)
Creatinine, Ser: 1.02 mg/dL (ref 0.40–1.20)
GFR: 52.2 mL/min — ABNORMAL LOW (ref >60.00)
Glucose, Bld: 91 mg/dL (ref 70–99)
Potassium: 4.2 mEq/L (ref 3.5–5.1)
Sodium: 138 mEq/L (ref 135–145)
Total Bilirubin: 0.3 mg/dL (ref 0.2–1.2)
Total Protein: 6 g/dL (ref 6.0–8.3)

## 2021-08-02 LAB — CBC WITH DIFFERENTIAL/PLATELET
Basophils Absolute: 0 10*3/uL (ref 0.0–0.1)
Basophils Relative: 0.7 % (ref 0.0–3.0)
Eosinophils Absolute: 0.2 10*3/uL (ref 0.0–0.7)
Eosinophils Relative: 2.5 % (ref 0.0–5.0)
HCT: 38.8 % (ref 36.0–46.0)
Hemoglobin: 12.8 g/dL (ref 12.0–15.0)
Lymphocytes Relative: 15.4 % (ref 12.0–46.0)
Lymphs Abs: 1 10*3/uL (ref 0.7–4.0)
MCHC: 33.1 g/dL (ref 30.0–36.0)
MCV: 97.3 fl (ref 78.0–100.0)
Monocytes Absolute: 0.8 10*3/uL (ref 0.1–1.0)
Monocytes Relative: 12.2 % — ABNORMAL HIGH (ref 3.0–12.0)
Neutro Abs: 4.3 10*3/uL (ref 1.4–7.7)
Neutrophils Relative %: 69.2 % (ref 43.0–77.0)
Platelets: 306 10*3/uL (ref 150.0–400.0)
RBC: 3.99 Mil/uL (ref 3.87–5.11)
RDW: 14 % (ref 11.5–15.5)
WBC: 6.2 10*3/uL (ref 4.0–10.5)

## 2021-08-02 LAB — TSH: TSH: 3.37 u[IU]/mL (ref 0.35–5.50)

## 2021-08-02 LAB — VITAMIN B12: Vitamin B-12: 148 pg/mL — ABNORMAL LOW (ref 211–911)

## 2021-08-04 NOTE — Addendum Note (Signed)
Addended by: Crecencio Mc on: 08/04/2021 02:48 PM   Modules accepted: Orders

## 2021-08-06 DIAGNOSIS — M47817 Spondylosis without myelopathy or radiculopathy, lumbosacral region: Secondary | ICD-10-CM | POA: Diagnosis not present

## 2021-08-07 ENCOUNTER — Other Ambulatory Visit: Payer: Self-pay

## 2021-08-07 ENCOUNTER — Ambulatory Visit (INDEPENDENT_AMBULATORY_CARE_PROVIDER_SITE_OTHER): Payer: Medicare HMO

## 2021-08-07 DIAGNOSIS — E538 Deficiency of other specified B group vitamins: Secondary | ICD-10-CM | POA: Diagnosis not present

## 2021-08-07 MED ORDER — CYANOCOBALAMIN 1000 MCG/ML IJ SOLN
1000.0000 ug | Freq: Once | INTRAMUSCULAR | Status: AC
  Administered 2021-08-07: 1000 ug via INTRAMUSCULAR

## 2021-08-07 NOTE — Progress Notes (Signed)
Patient presented for B 12 injection to left deltoid, patient voiced no concerns nor showed any signs of distress during injection. 

## 2021-08-08 ENCOUNTER — Telehealth: Payer: Self-pay | Admitting: Internal Medicine

## 2021-08-08 DIAGNOSIS — H547 Unspecified visual loss: Secondary | ICD-10-CM | POA: Diagnosis not present

## 2021-08-08 DIAGNOSIS — H539 Unspecified visual disturbance: Secondary | ICD-10-CM | POA: Diagnosis not present

## 2021-08-08 DIAGNOSIS — H538 Other visual disturbances: Secondary | ICD-10-CM | POA: Diagnosis not present

## 2021-08-08 DIAGNOSIS — H3530 Unspecified macular degeneration: Secondary | ICD-10-CM | POA: Diagnosis not present

## 2021-08-08 DIAGNOSIS — G43109 Migraine with aura, not intractable, without status migrainosus: Secondary | ICD-10-CM | POA: Diagnosis not present

## 2021-08-08 DIAGNOSIS — E039 Hypothyroidism, unspecified: Secondary | ICD-10-CM | POA: Diagnosis not present

## 2021-08-08 DIAGNOSIS — Z853 Personal history of malignant neoplasm of breast: Secondary | ICD-10-CM | POA: Diagnosis not present

## 2021-08-08 NOTE — Telephone Encounter (Signed)
Spoke with pt and and advised her that she be seen at the ED for a possible stroke. Pt stated that at 10am today she got very dizzy, unable to function normally and when asked if her speech was altered she stated she had no idea. Her friends that were there when this happened stated that she was not normal. Pt stated that when it happened she called 911, they checked her vitals and did an EKG and it was normal. I explained to pt that just because her EKG was normal does not mean that she wasn't or didn't have stroke. Pt did not want to go to the ED because of the long wait even though it is highly reccommended that she be evaluated as soon as possible. Pt was also given several options of ED that she could go to. Pt finally stated that she would have her husband take her.

## 2021-08-08 NOTE — Telephone Encounter (Signed)
Pt called in complaining of feeling dizzy. Pt said it began about 10 o'clock on 11/9. Pt was sent to access nurse

## 2021-08-08 NOTE — Telephone Encounter (Signed)
noted 

## 2021-08-08 NOTE — Telephone Encounter (Signed)
Pt was seen at [facility] ED.

## 2021-08-09 DIAGNOSIS — H539 Unspecified visual disturbance: Secondary | ICD-10-CM | POA: Diagnosis not present

## 2021-08-09 NOTE — Telephone Encounter (Signed)
Pt called in stating that she would like a callback. Pt stated that she needs to know what is the next step from her episode from yesterday that ended her at the ED.

## 2021-08-09 NOTE — Telephone Encounter (Signed)
Spoke with pt and scheduled her for an ED follow up for tomorrow.

## 2021-08-10 ENCOUNTER — Telehealth: Payer: Self-pay

## 2021-08-10 ENCOUNTER — Encounter: Payer: Self-pay | Admitting: Internal Medicine

## 2021-08-10 ENCOUNTER — Other Ambulatory Visit: Payer: Self-pay

## 2021-08-10 ENCOUNTER — Ambulatory Visit (INDEPENDENT_AMBULATORY_CARE_PROVIDER_SITE_OTHER): Payer: Medicare HMO | Admitting: Internal Medicine

## 2021-08-10 VITALS — BP 124/72 | HR 64 | Temp 96.9°F | Ht 63.0 in | Wt 151.0 lb

## 2021-08-10 DIAGNOSIS — R933 Abnormal findings on diagnostic imaging of other parts of digestive tract: Secondary | ICD-10-CM

## 2021-08-10 DIAGNOSIS — G459 Transient cerebral ischemic attack, unspecified: Secondary | ICD-10-CM

## 2021-08-10 DIAGNOSIS — R404 Transient alteration of awareness: Secondary | ICD-10-CM

## 2021-08-10 DIAGNOSIS — E7800 Pure hypercholesterolemia, unspecified: Secondary | ICD-10-CM | POA: Diagnosis not present

## 2021-08-10 MED ORDER — CLOPIDOGREL BISULFATE 75 MG PO TABS: 75.0000 mg | ORAL_TABLET | Freq: Every day | ORAL | 2 refills | Status: DC

## 2021-08-10 NOTE — Patient Instructions (Signed)
I am concerned that you had a TIA.  So please start the ASPIRIN  and Plavix   CAROTID DOPPLERS HAVE BEEN ORDERED

## 2021-08-10 NOTE — Progress Notes (Signed)
Subjective:  Patient ID: Tammy Tate, female    DOB: [birth year]  Age: 80 y.o. MRN: [id number]  CC: The primary encounter diagnosis was Transient alteration of awareness. Diagnoses of TIA (transient ischemic attack), Abnormal barium swallow, and Pure hypercholesterolemia were also pertinent to this visit.  HPI Tammy Tate presents for  Chief Complaint  Patient presents with   Follow-up    ED follow up from [facility] Pt was advised to be evaluated at the ED due to her having an episode of dizziness, slurred speech and just not acting normal to per her friends that she was playing cards with at the time.     80 yr old female with history of migraines,  breast CA presents after being evaluated in ER at [facility] for transient neurologic symptoms which included blurred vision,  slurred speech, strange affect and visual changes (only symptom reported by [facility] ER physician)   CT head no stroke.  Advised to consider asa/plavix given potential diagnosis of TIA (vs opthalmoplegic migraine, and advised to obtain carotid dopplers and lipid panel   Outpatient Medications Prior to Visit  Medication Sig Dispense Refill   ALPRAZolam (XANAX) 0.25 MG tablet TAKE ONE TABLET BY MOUTH TWICE A DAY AS NEEDED FOR ANXIETYT 20 tablet 0   Biotin 5 MG TABS Take by mouth.     Calcium-Magnesium-Vitamin D (CALCIUM 1200+D3 PO) Take 1 tablet by mouth. Ca 1200mg   D3 48mcg     levothyroxine (SYNTHROID) 88 MCG tablet TAKE ONE TABLET BY MOUTH EVERY MORNING AT LEAST 30-60 MINUTES BEFORE BREAKFAST. TAKE ON AN EMPTY STOMACH WITH A GLASS OF WATER 90 tablet 0   Multiple Vitamins-Minerals (PRESERVISION AREDS 2 PO)      Omega-3 Fatty Acids (OMEGA 3 PO) Take by mouth daily.     Turmeric 500 MG CAPS Take 2 capsules by mouth daily.      vitamin B-12 (CYANOCOBALAMIN) 1000 MCG tablet Take 3,000 mcg by mouth daily.     aspirin 81 MG chewable tablet Chew by mouth. (Patient not taking: Reported on 08/10/2021)     clopidogrel (PLAVIX) 75 MG  tablet Take by mouth. (Patient not taking: Reported on 08/10/2021)     No facility-administered medications prior to visit.    Review of Systems;  Patient denies headache, fevers, malaise, unintentional weight loss, skin rash, eye pain, sinus congestion and sinus pain, sore throat, dysphagia,  hemoptysis , cough, dyspnea, wheezing, chest pain, palpitations, orthopnea, edema, abdominal pain, nausea, melena, diarrhea, constipation, flank pain, dysuria, hematuria, urinary  Frequency, nocturia, numbness, tingling, seizures,  Focal weakness, Loss of consciousness,  Tremor, insomnia, depression, anxiety, and suicidal ideation.      Objective:  BP 124/72 (BP Location: Left Arm, Patient Position: Sitting, Cuff Size: Normal)   Pulse 64   Temp (!) 96.9 F (36.1 C) (Temporal)   Ht 5\' 3"  (1.6 m)   Wt 151 lb (68.5 kg)   SpO2 98%   BMI 26.75 kg/m   BP Readings from Last 3 Encounters:  08/10/21 124/72  08/01/21 112/68  03/06/21 136/83    Wt Readings from Last 3 Encounters:  08/10/21 151 lb (68.5 kg)  08/01/21 153 lb 3.2 oz (69.5 kg)  03/06/21 156 lb 6.4 oz (70.9 kg)    General appearance: alert, cooperative and appears stated age Ears: normal TM's and external ear canals both ears Throat: lips, mucosa, and tongue normal; teeth and gums normal Neck: no adenopathy, no carotid bruit, supple, symmetrical, trachea midline and thyroid not enlarged, symmetric,  no tenderness/mass/nodules Back: symmetric, no curvature. ROM normal. No CVA tenderness. Lungs: clear to auscultation bilaterally Heart: regular rate and rhythm, S1, S2 normal, no murmur, click, rub or gallop Abdomen: soft, non-tender; bowel sounds normal; no masses,  no organomegaly Pulses: 2+ and symmetric Skin: Skin color, texture, turgor normal. No rashes or lesions Lymph nodes: Cervical, supraclavicular, and axillary nodes normal.  Lab Results  Component Value Date   HGBA1C 5.1 11/16/2020   HGBA1C 5.1 06/17/2016    Lab  Results  Component Value Date   CREATININE 1.02 08/01/2021   CREATININE 0.87 11/16/2020   CREATININE 0.95 05/17/2020    Lab Results  Component Value Date   WBC 6.2 08/01/2021   HGB 12.8 08/01/2021   HCT 38.8 08/01/2021   PLT 306.0 08/01/2021   GLUCOSE 91 08/01/2021   CHOL 206 (H) 08/10/2021   TRIG 82 08/10/2021   HDL 85 08/10/2021   LDLDIRECT 127.3 07/05/2013   LDLCALC 104 (H) 08/10/2021   ALT 18 08/01/2021   AST 22 08/01/2021   NA 138 08/01/2021   K 4.2 08/01/2021   CL 103 08/01/2021   CREATININE 1.02 08/01/2021   BUN 16 08/01/2021   CO2 26 08/01/2021   TSH 1.15 08/10/2021   HGBA1C 5.1 11/16/2020   MICROALBUR 1.3 06/17/2016   rally to the patient by the Speech Pathologist. Imaging of the pharynx was performed in the lateral projection. The radiologist was present in the fluoroscopy room for this study, providing personal supervision. FLUOROSCOPY TIME:  Fluoroscopy Time:  1 minute, 24 seconds. Radiation Exposure Index (if provided by the fluoroscopic device): 11.90 mGy COMPARISON:  Report from esophagram 08/21/2001 (images unavailable). FINDINGS: Different consistencies of barium were administered orally to the patient by the Speech Pathologist with fluoroscopic imaging of the pharynx from a lateral projection. Tammy Billing, PA was present in the fluoroscopy room for this study, providing personal supervision. The Speech Pathologist did not observe aspiration. Irregular contour of the anterior hypopharynx/upper cervical esophagus at the C5-C6 level (best appreciated on series 10, image 85). Prominent cricopharyngeus muscle. Please refer to the Speech Pathologist's report for full details. Impression #2 will be called to the ordering clinician or representative by the Radiologist Assistant, and communication documented in the PACS or [employer]. IMPRESSION: Modified barium swallow, as described. No aspiration was observed by the Speech Pathologist. Irregular contour of the  anterior hypopharynx/upper cervical esophagus at the C5-C6 level. Endoscopy is recommended to exclude a mucosal/submucosal mass or lesion at this site. Prominent cricopharyngeus muscle. Please refer to the Speech Pathologist's report for additional details and recommendations. Electronically Signed   By: Kellie Simmering D.O.   On: 06/27/2021 13:31 CHL IP CLINICAL IMPRESSIONS 06/27/2021 Clinical Impression Patient presents with functional oropharyngeal swallowing with noted mild age-related changes. Oral stage is characterized by adequate lip closure, bolus preparation, and anterior to posterior transit. Swallow initiation is timely at the level of the base of tongue. Pharyngeal stage is noted for adequate tongue base retraction, hyolaryngeal excursion, and pharyngeal constriction. Note appearance of bony protrustions along anterior cervical spine. As the epiglottis deflects, this results in slowed/incomplete deflection as the laryngeal surface of the epiglottis contacts the posterior pharyngeal wall, intermittently resulting in mild coating of residue in the valleculae and above the pharyngoesophageal segment. Pt independently senses residue (localizes and points to region of PE segment when residue is present) and typically initiates a second swallow, which is largely effective in reducing residue.  Trace, shallow, flash penetration of thin liquid x1 during multiple  swallows. There is no aspiration. Pharyngeal stripping wave is complete. Amplitude/duration of cricopharyngeus opening is WFL; prominent cricopharyngeus noted. There was adequate/complete clearance through the cervical esophagus.  Consistencies tested were thin liquids x2 teaspoons, 1 cup sip, and 4 consecutive cup sips, nectar x1 cup sip, puree x2 heaping tsps, mechanical soft (1/4 graham cracker in applesauce x2), 11mm barium tablet. Reviewed study findings with patient and educated on general aspiration precautions, as well as strategies such as multiple  swallows, following solids with liquids if sensing residue. Recommend patient continue regular diet with thin liquids; no further ST indicated at this time. SLP Visit Diagnosis Dysphagia, oropharyngeal phase (R13.12) Impact on safety and function Mild aspiration risk Please see full SLP report in Notes. Deneise Lever, MS, CCC-SLP Speech-Language Pathologist      Assessment & Plan:   Problem List Items Addressed This Visit     HLD (hyperlipidemia)    Recommending starting statin therapy despite excellent panel,  Given her recent TIA   Lab Results  Component Value Date   CHOL 206 (H) 08/10/2021   HDL 85 08/10/2021   LDLCALC 104 (H) 08/10/2021   LDLDIRECT 127.3 07/05/2013   TRIG 82 08/10/2021   CHOLHDL 2.4 08/10/2021         Relevant Medications   aspirin 81 MG chewable tablet   Abnormal barium swallow    In review of her chart,  She was referred by Dr Tami Ribas for a barium swallow.  No dysphagia or aspiration was found; however,  The radiologist noted that " an Irregular contour of the anterior hypopharynx/upper cervical esophagus at the C5-C6 leve (was present)l. Endoscopy is recommended to exclude a mucosal/submucosal mass or lesion at this site. Prominent cricopharyngeus muscle."        Transient alteration of awareness - Primary   Relevant Orders   TSH (Completed)   TIA (transient ischemic attack)    Given that her symptoms were clearly different than those that occur during her ophthalmoplegic migraines, I agree that the episode must be treated as a TIA . ISE of ASA  and plavix strongly recommended.  Carotid dopplers ordered. Will also recommend use of statin       Relevant Medications   aspirin 81 MG chewable tablet   Other Relevant Orders   Lipid panel (Completed)   VAS US CAROTID    I spent 40 minutes dedicated to the care of this patient on the date of this encounter to include pre-visit review of her medical history,  most recent imaging studies, Face-to-face time  with the patient , and post visit ordering of testing and therapeutics.    Meds ordered this encounter  Medications   clopidogrel (PLAVIX) 75 MG tablet    Sig: Take 1 tablet (75 mg total) by mouth daily.    Dispense:  30 tablet    Refill:  2    Medications Discontinued During This Encounter  Medication Reason   clopidogrel (PLAVIX) 75 MG tablet     Follow-up: No follow-ups on file.   Crecencio Mc, MD

## 2021-08-10 NOTE — Telephone Encounter (Signed)
Pt called back and stated that she would like to have something sent to her mychart that told her what the symptoms of a stroke is.

## 2021-08-11 DIAGNOSIS — R404 Transient alteration of awareness: Secondary | ICD-10-CM | POA: Insufficient documentation

## 2021-08-11 DIAGNOSIS — R933 Abnormal findings on diagnostic imaging of other parts of digestive tract: Secondary | ICD-10-CM | POA: Insufficient documentation

## 2021-08-11 DIAGNOSIS — Z79899 Other long term (current) drug therapy: Secondary | ICD-10-CM

## 2021-08-11 DIAGNOSIS — Z8673 Personal history of transient ischemic attack (TIA), and cerebral infarction without residual deficits: Secondary | ICD-10-CM | POA: Insufficient documentation

## 2021-08-11 DIAGNOSIS — E538 Deficiency of other specified B group vitamins: Secondary | ICD-10-CM

## 2021-08-11 DIAGNOSIS — G459 Transient cerebral ischemic attack, unspecified: Secondary | ICD-10-CM | POA: Insufficient documentation

## 2021-08-11 LAB — LIPID PANEL
Cholesterol: 206 mg/dL — ABNORMAL HIGH (ref <200)
HDL: 85 mg/dL (ref >=50)
LDL Cholesterol (Calc): 104 mg/dL (calc) — ABNORMAL HIGH
Non-HDL Cholesterol (Calc): 121 mg/dL (calc) (ref <130)
Total CHOL/HDL Ratio: 2.4 (calc) (ref <5.0)
Triglycerides: 82 mg/dL (ref <150)

## 2021-08-11 LAB — TSH: TSH: 1.15 mIU/L (ref 0.40–4.50)

## 2021-08-11 NOTE — Assessment & Plan Note (Addendum)
In review of her chart,  She was referred by Dr Tami Ribas for a barium swallow.  No dysphagia or aspiration was found; however,  The radiologist noted that " an Irregular contour of the anterior hypopharynx/upper cervical esophagus at the C5-C6 leve (was present)l. Endoscopy is recommended to exclude a mucosal/submucosal mass or lesion at this site. Prominent cricopharyngeus muscle."

## 2021-08-11 NOTE — Assessment & Plan Note (Addendum)
Given that her symptoms were clearly different than those that occur during her ophthalmoplegic migraines, I agree that the episode must be treated as a TIA . ISE of ASA  and plavix strongly recommended.  Carotid dopplers ordered. Will also recommend use of statin

## 2021-08-11 NOTE — Assessment & Plan Note (Signed)
Recommending starting statin therapy despite excellent panel,  Given her recent TIA   Lab Results  Component Value Date   CHOL 206 (H) 08/10/2021   HDL 85 08/10/2021   LDLCALC 104 (H) 08/10/2021   LDLDIRECT 127.3 07/05/2013   TRIG 82 08/10/2021   CHOLHDL 2.4 08/10/2021

## 2021-08-13 DIAGNOSIS — H353131 Nonexudative age-related macular degeneration, bilateral, early dry stage: Secondary | ICD-10-CM | POA: Diagnosis not present

## 2021-08-13 NOTE — Telephone Encounter (Signed)
Spoke with pt and she stated that she apologized for calling the other day before looking at her AVS with the information that she was requesting.

## 2021-08-14 ENCOUNTER — Other Ambulatory Visit: Payer: Self-pay

## 2021-08-14 ENCOUNTER — Ambulatory Visit (INDEPENDENT_AMBULATORY_CARE_PROVIDER_SITE_OTHER): Payer: Medicare HMO

## 2021-08-14 DIAGNOSIS — E538 Deficiency of other specified B group vitamins: Secondary | ICD-10-CM

## 2021-08-14 MED ORDER — CYANOCOBALAMIN 1000 MCG/ML IJ SOLN
1000.0000 ug | Freq: Once | INTRAMUSCULAR | Status: AC
Start: 2021-08-14 — End: 2021-08-14
  Administered 2021-08-14: 1000 ug via INTRAMUSCULAR

## 2021-08-14 NOTE — Progress Notes (Signed)
Patient presented for B 12 injection to right deltoid, patient voiced no concerns nor showed any signs of distress during injection. 

## 2021-08-15 ENCOUNTER — Ambulatory Visit (INDEPENDENT_AMBULATORY_CARE_PROVIDER_SITE_OTHER): Payer: Medicare HMO

## 2021-08-15 ENCOUNTER — Other Ambulatory Visit: Payer: Self-pay

## 2021-08-15 DIAGNOSIS — G459 Transient cerebral ischemic attack, unspecified: Secondary | ICD-10-CM | POA: Diagnosis not present

## 2021-08-17 ENCOUNTER — Encounter: Payer: Self-pay | Admitting: Internal Medicine

## 2021-08-17 DIAGNOSIS — M791 Myalgia, unspecified site: Secondary | ICD-10-CM

## 2021-08-17 DIAGNOSIS — T466X5A Adverse effect of antihyperlipidemic and antiarteriosclerotic drugs, initial encounter: Secondary | ICD-10-CM

## 2021-08-19 MED ORDER — ATORVASTATIN CALCIUM 20 MG PO TABS: 20.0000 mg | ORAL_TABLET | Freq: Every day | ORAL | 3 refills | Status: DC

## 2021-08-19 NOTE — Telephone Encounter (Signed)
Please schedule the lab visit at her next b12 ijnjection

## 2021-08-19 NOTE — Addendum Note (Signed)
Addended by: Crecencio Mc on: 08/19/2021 02:23 PM   Modules accepted: Orders

## 2021-08-21 ENCOUNTER — Other Ambulatory Visit: Payer: Self-pay

## 2021-08-21 ENCOUNTER — Ambulatory Visit (INDEPENDENT_AMBULATORY_CARE_PROVIDER_SITE_OTHER): Payer: Medicare HMO

## 2021-08-21 DIAGNOSIS — E538 Deficiency of other specified B group vitamins: Secondary | ICD-10-CM

## 2021-08-21 MED ORDER — CYANOCOBALAMIN 1000 MCG/ML IJ SOLN
1000.0000 ug | Freq: Once | INTRAMUSCULAR | Status: AC
  Administered 2021-08-21: 1000 ug via INTRAMUSCULAR

## 2021-08-21 NOTE — Progress Notes (Signed)
Patient presented for B 12 injection to left deltoid, patient voiced no concerns nor showed any signs of distress during injection. 

## 2021-08-27 DIAGNOSIS — M5412 Radiculopathy, cervical region: Secondary | ICD-10-CM | POA: Diagnosis not present

## 2021-08-28 ENCOUNTER — Other Ambulatory Visit: Payer: Self-pay | Admitting: Internal Medicine

## 2021-08-28 DIAGNOSIS — M791 Myalgia, unspecified site: Secondary | ICD-10-CM | POA: Insufficient documentation

## 2021-08-28 DIAGNOSIS — T466X5A Adverse effect of antihyperlipidemic and antiarteriosclerotic drugs, initial encounter: Secondary | ICD-10-CM | POA: Insufficient documentation

## 2021-08-28 NOTE — Assessment & Plan Note (Signed)
She has been unable to tolerate statin trial due to diffuse muscle pain that has been activity limiting

## 2021-08-29 ENCOUNTER — Other Ambulatory Visit: Payer: Self-pay

## 2021-08-29 ENCOUNTER — Ambulatory Visit (INDEPENDENT_AMBULATORY_CARE_PROVIDER_SITE_OTHER): Payer: Medicare HMO

## 2021-08-29 DIAGNOSIS — E538 Deficiency of other specified B group vitamins: Secondary | ICD-10-CM | POA: Diagnosis not present

## 2021-08-29 DIAGNOSIS — R944 Abnormal results of kidney function studies: Secondary | ICD-10-CM

## 2021-08-29 LAB — BASIC METABOLIC PANEL
BUN: 14 mg/dL (ref 6–23)
CO2: 28 mEq/L (ref 19–32)
Calcium: 9.3 mg/dL (ref 8.4–10.5)
Chloride: 103 mEq/L (ref 96–112)
Creatinine, Ser: 0.88 mg/dL (ref 0.40–1.20)
GFR: 62.29 mL/min (ref >60.00)
Glucose, Bld: 68 mg/dL — ABNORMAL LOW (ref 70–99)
Potassium: 3.9 mEq/L (ref 3.5–5.1)
Sodium: 138 mEq/L (ref 135–145)

## 2021-08-29 MED ORDER — CYANOCOBALAMIN 1000 MCG/ML IJ SOLN
1000.0000 ug | Freq: Once | INTRAMUSCULAR | Status: AC
  Administered 2021-08-29: 1000 ug via INTRAMUSCULAR

## 2021-08-29 NOTE — Progress Notes (Signed)
Patient presented for B 12 injection to right deltoid, patient voiced no concerns nor showed any signs of distress during injection. 

## 2021-08-30 DIAGNOSIS — M6281 Muscle weakness (generalized): Secondary | ICD-10-CM | POA: Diagnosis not present

## 2021-08-30 DIAGNOSIS — M5412 Radiculopathy, cervical region: Secondary | ICD-10-CM | POA: Diagnosis not present

## 2021-08-30 DIAGNOSIS — M542 Cervicalgia: Secondary | ICD-10-CM | POA: Diagnosis not present

## 2021-09-04 ENCOUNTER — Encounter: Payer: Self-pay | Admitting: Internal Medicine

## 2021-09-04 LAB — INTRINSIC FACTOR ANTIBODIES: Intrinsic Factor: NEGATIVE

## 2021-09-04 LAB — FOLATE RBC: RBC Folate: 625 ng/mL RBC (ref >280)

## 2021-09-06 DIAGNOSIS — D2272 Melanocytic nevi of left lower limb, including hip: Secondary | ICD-10-CM | POA: Diagnosis not present

## 2021-09-06 DIAGNOSIS — Z85828 Personal history of other malignant neoplasm of skin: Secondary | ICD-10-CM | POA: Diagnosis not present

## 2021-09-06 DIAGNOSIS — L821 Other seborrheic keratosis: Secondary | ICD-10-CM | POA: Diagnosis not present

## 2021-09-06 DIAGNOSIS — D225 Melanocytic nevi of trunk: Secondary | ICD-10-CM | POA: Diagnosis not present

## 2021-09-06 DIAGNOSIS — D2262 Melanocytic nevi of left upper limb, including shoulder: Secondary | ICD-10-CM | POA: Diagnosis not present

## 2021-09-06 DIAGNOSIS — L570 Actinic keratosis: Secondary | ICD-10-CM | POA: Diagnosis not present

## 2021-09-06 DIAGNOSIS — R233 Spontaneous ecchymoses: Secondary | ICD-10-CM | POA: Diagnosis not present

## 2021-09-07 DIAGNOSIS — M542 Cervicalgia: Secondary | ICD-10-CM | POA: Diagnosis not present

## 2021-09-07 DIAGNOSIS — M6281 Muscle weakness (generalized): Secondary | ICD-10-CM | POA: Diagnosis not present

## 2021-09-07 DIAGNOSIS — M5412 Radiculopathy, cervical region: Secondary | ICD-10-CM | POA: Diagnosis not present

## 2021-09-11 ENCOUNTER — Ambulatory Visit: Payer: Medicare HMO

## 2021-10-02 ENCOUNTER — Ambulatory Visit: Payer: Medicare HMO

## 2021-10-09 DIAGNOSIS — M6281 Muscle weakness (generalized): Secondary | ICD-10-CM | POA: Diagnosis not present

## 2021-10-09 DIAGNOSIS — M5412 Radiculopathy, cervical region: Secondary | ICD-10-CM | POA: Diagnosis not present

## 2021-10-11 DIAGNOSIS — M542 Cervicalgia: Secondary | ICD-10-CM | POA: Diagnosis not present

## 2021-10-11 DIAGNOSIS — M5412 Radiculopathy, cervical region: Secondary | ICD-10-CM | POA: Diagnosis not present

## 2021-10-11 DIAGNOSIS — M6281 Muscle weakness (generalized): Secondary | ICD-10-CM | POA: Diagnosis not present

## 2021-10-15 ENCOUNTER — Ambulatory Visit: Payer: Medicare HMO | Admitting: Gastroenterology

## 2021-10-16 DIAGNOSIS — M6281 Muscle weakness (generalized): Secondary | ICD-10-CM | POA: Diagnosis not present

## 2021-10-16 DIAGNOSIS — M542 Cervicalgia: Secondary | ICD-10-CM | POA: Diagnosis not present

## 2021-10-16 DIAGNOSIS — M5412 Radiculopathy, cervical region: Secondary | ICD-10-CM | POA: Diagnosis not present

## 2021-10-18 DIAGNOSIS — M542 Cervicalgia: Secondary | ICD-10-CM | POA: Diagnosis not present

## 2021-10-18 DIAGNOSIS — M5412 Radiculopathy, cervical region: Secondary | ICD-10-CM | POA: Diagnosis not present

## 2021-10-22 ENCOUNTER — Ambulatory Visit: Payer: Medicare HMO | Admitting: Gastroenterology

## 2021-10-26 DIAGNOSIS — M5416 Radiculopathy, lumbar region: Secondary | ICD-10-CM | POA: Diagnosis not present

## 2021-10-29 DIAGNOSIS — M9901 Segmental and somatic dysfunction of cervical region: Secondary | ICD-10-CM | POA: Diagnosis not present

## 2021-10-29 DIAGNOSIS — M9902 Segmental and somatic dysfunction of thoracic region: Secondary | ICD-10-CM | POA: Diagnosis not present

## 2021-10-29 DIAGNOSIS — M5412 Radiculopathy, cervical region: Secondary | ICD-10-CM | POA: Diagnosis not present

## 2021-10-29 DIAGNOSIS — M5033 Other cervical disc degeneration, cervicothoracic region: Secondary | ICD-10-CM | POA: Diagnosis not present

## 2021-11-01 ENCOUNTER — Other Ambulatory Visit: Payer: Self-pay

## 2021-11-01 ENCOUNTER — Ambulatory Visit (INDEPENDENT_AMBULATORY_CARE_PROVIDER_SITE_OTHER): Payer: Medicare HMO | Admitting: Internal Medicine

## 2021-11-01 ENCOUNTER — Encounter: Payer: Self-pay | Admitting: Internal Medicine

## 2021-11-01 VITALS — BP 122/62 | HR 63 | Temp 97.6°F | Ht 63.0 in | Wt 153.1 lb

## 2021-11-01 DIAGNOSIS — E034 Atrophy of thyroid (acquired): Secondary | ICD-10-CM | POA: Diagnosis not present

## 2021-11-01 DIAGNOSIS — R933 Abnormal findings on diagnostic imaging of other parts of digestive tract: Secondary | ICD-10-CM | POA: Diagnosis not present

## 2021-11-01 DIAGNOSIS — Z8673 Personal history of transient ischemic attack (TIA), and cerebral infarction without residual deficits: Secondary | ICD-10-CM

## 2021-11-01 DIAGNOSIS — Z0000 Encounter for general adult medical examination without abnormal findings: Secondary | ICD-10-CM | POA: Diagnosis not present

## 2021-11-01 DIAGNOSIS — E7800 Pure hypercholesterolemia, unspecified: Secondary | ICD-10-CM | POA: Diagnosis not present

## 2021-11-01 NOTE — Progress Notes (Signed)
Patient ID: Tammy Tate, female    DOB: 1941/04/18  Age: 81 y.o. MRN: [id number]  The patient is here for annual preventive examination and management of other chronic and acute problems.   The risk factors are reflected in the social history.  The roster of all physicians providing medical care to patient - is listed in the Snapshot section of the chart.  Activities of daily living:  The patient is 100% independent in all ADLs: dressing, toileting, feeding as well as independent mobility  Home safety : The patient has smoke detectors in the home. They wear seatbelts.  There are no firearms at home. There is no violence in the home.   There is no risks for hepatitis, STDs or HIV. There is no   history of blood transfusion. They have no travel history to infectious disease endemic areas of the world.  The patient has seen their dentist in the last six month. They have seen their eye doctor in the last year. She denies any hearing difficulty with regard to whispered voices and some television programs.  They have deferred audiologic testing in the last year.  She does  not  have excessive sun exposure. Discussed the need for sun protection: hats, long sleeves and use of sunscreen if there is significant sun exposure.   Diet: the importance of a healthy diet is discussed. She does have a healthy diet.  The benefits of regular aerobic exercise were discussed. She walks 5 times per week ,  60 minutes.   Depression screen: there are no signs or vegative symptoms of depression- irritability, change in appetite, anhedonia, sadness/tearfullness.  Cognitive assessment: the patient manages all their financial and personal affairs and is actively engaged. They could relate day,date,year and events; recalled 2/3 objects at 3 minutes; performed clock-face test normally.  The following portions of the patient's history were reviewed and updated as appropriate: allergies, current medications, past family  history, past medical history,  past surgical history, past social history  and problem list.  Visual acuity was not assessed per patient preference since she has regular follow up with her ophthalmologist. Hearing and body mass index were assessed and reviewed.   During the course of the visit the patient was educated and counseled about appropriate screening and preventive services including : fall prevention , diabetes screening, nutrition counseling, colorectal cancer screening, and recommended immunizations.    CC: The primary encounter diagnosis was Hypothyroidism due to acquired atrophy of thyroid. Diagnoses of Pure hypercholesterolemia, Abnormal barium swallow, Visit for preventive health examination, and History of TIA (transient ischemic attack) were also pertinent to this visit.  Occasional trouble maintaining sleep.  Uses alprazolam  prn  less than once a week . Limits  Alcohol use < 1 glass of wine per night  Feels very good, calm.  Has  finally decided to retire from work due to worsening vision,  and has decided to stop postponing cataract surgery (which has been complicating her vision loss from macular degeneration ) with Dr Thomasene Ripple     History Tammy Tate has a past medical history of Allergy, B12 deficiency, Bronchitis, Cancer ([place]) (2010), Chicken pox, Degenerative arthritis of lumbar spine, Degenerative disk disease, GERD (gastroesophageal reflux disease), Migraine with aura, Thyroid disease, and Vasomotor rhinitis.   She has a past surgical history that includes Appendectomy; Modified radical mastectomy w/ axillary lymph node dissection w/ pectoral minor preservation (2010); Abdominal hysterectomy; Colonoscopy with propofol (N/A, 06/10/2019); and Esophagogastroduodenoscopy (egd) with propofol (N/A, 06/10/2019).  Her family history includes Cancer (age of onset: 2) in her mother; Diabetes in her maternal grandfather; Heart disease in her father; Hyperlipidemia in her father;  Pancreatitis in her daughter; Stroke (age of onset: 29) in her father.She reports that she quit smoking about 50 years ago. Her smoking use included cigarettes. She has never used smokeless tobacco. She reports current alcohol use of about 7.0 standard drinks per week. She reports that she does not use drugs.  Outpatient Medications Prior to Visit  Medication Sig Dispense Refill   ALPRAZolam (XANAX) 0.25 MG tablet TAKE ONE TABLET BY MOUTH TWICE A DAY AS NEEDED FOR ANXIETYT 20 tablet 0   Biotin 5 MG TABS Take by mouth.     Calcium-Magnesium-Vitamin D (CALCIUM 1200+D3 PO) Take 1 tablet by mouth. Ca 1200mg   D3 81mcg     clopidogrel (PLAVIX) 75 MG tablet Take 1 tablet (75 mg total) by mouth daily. 30 tablet 2   levothyroxine (SYNTHROID) 88 MCG tablet TAKE 1 TABLET BY MOUTH EVERY MORNING AT 30-60 MINUTES BEFORE BREAKFAST. TAKE ON AN EMPTY STOMACH WITH A GLASS OF WATER. 90 tablet 0   Multiple Vitamins-Minerals (PRESERVISION AREDS 2 PO)      Omega-3 Fatty Acids (OMEGA 3 PO) Take by mouth daily.     Turmeric 500 MG CAPS Take 2 capsules by mouth daily.      vitamin B-12 (CYANOCOBALAMIN) 1000 MCG tablet Take 3,000 mcg by mouth daily.     atorvastatin (LIPITOR) 20 MG tablet Take 1 tablet (20 mg total) by mouth daily. 90 tablet 3   No facility-administered medications prior to visit.    Review of Systems  Patient denies headache, fevers, malaise, unintentional weight loss, skin rash, eye pain, sinus congestion and sinus pain, sore throat, dysphagia,  hemoptysis , cough, dyspnea, wheezing, chest pain, palpitations, orthopnea, edema, abdominal pain, nausea, melena, diarrhea, constipation, flank pain, dysuria, hematuria, urinary  Frequency, nocturia, numbness, tingling, seizures,  Focal weakness, Loss of consciousness,  Tremor, insomnia, depression, anxiety, and suicidal ideation.     Objective:  BP 122/62 (BP Location: Left Arm, Patient Position: Sitting, Cuff Size: Normal)    Pulse 63    Temp 97.6 F  (36.4 C) (Oral)    Ht 5\' 3"  (1.6 m)    Wt 153 lb 1.9 oz (69.5 kg)    SpO2 99%    BMI 27.12 kg/m    Physical Exam  General appearance: alert, cooperative and appears stated age Ears: normal TM's and external ear canals both ears Throat: lips, mucosa, and tongue normal; teeth and gums normal Neck: no adenopathy, no carotid bruit, supple, symmetrical, trachea midline and thyroid not enlarged, symmetric, no tenderness/mass/nodules Back: symmetric, no curvature. ROM normal. No CVA tenderness. Lungs: clear to auscultation bilaterally Heart: regular rate and rhythm, S1, S2 normal, no murmur, click, rub or gallop Abdomen: soft, non-tender; bowel sounds normal; no masses,  no organomegaly Pulses: 2+ and symmetric Skin: Skin color, texture, turgor normal. No rashes or lesions Lymph nodes: Cervical, supraclavicular, and axillary nodes normal.  Neuro:  awake and interactive with normal mood and affect. Higher cortical functions are normal. Speech is clear without word-finding difficulty or dysarthria. Extraocular movements are intact. Visual fields of both eyes are grossly intact. Sensation to light touch is grossly intact bilaterally of upper and lower extremities. Motor examination shows 4+/5 symmetric hand grip and upper extremity and 5/5 lower extremity strength. There is no pronation or drift. Gait is non-ataxic   Assessment & Plan:   Problem List  Items Addressed This Visit     Hypothyroidism - Primary    Thyroid function is WNL on current dose.  No current changes needed.  Lab Results  Component Value Date   TSH 1.15 08/10/2021         Relevant Orders   TSH   HLD (hyperlipidemia)   Relevant Orders   Lipid panel   Comprehensive metabolic panel   Visit for preventive health examination    age appropriate education and counseling updated, referrals for preventative services and immunizations addressed, dietary and smoking counseling addressed, most recent labs reviewed.  I have  personally reviewed and have noted:   1) the patient's medical and social history 2) The pt's use of alcohol, tobacco, and illicit drugs 3) The patient's current medications and supplements 4) Functional ability including ADL's, fall risk, home safety risk, hearing and visual impairment 5) Diet and physical activities 6) Evidence for depression or mood disorder 7) The patient's height, weight, and BMI have been recorded in the chart    I have made referrals, and provided counseling and education based on review of the above      Abnormal barium swallow    She has been referred for Endoscopy and is waiting to see Dr Vicente Males       History of TIA (transient ischemic attack)    Taking ASA  and plavix  Carotid dopplers normal . Statin intolerant due to myalgias.  No recurrent symptoms        Medications Discontinued During This Encounter  Medication Reason   atorvastatin (LIPITOR) 20 MG tablet Patient Preference    Follow-up: No follow-ups on file.   Crecencio Mc, MD

## 2021-11-01 NOTE — Assessment & Plan Note (Addendum)
Taking ASA  and plavix  Carotid dopplers normal . Statin intolerant due to myalgias.  No recurrent symptoms

## 2021-11-01 NOTE — Patient Instructions (Signed)
You do not need to have labs until May.

## 2021-11-01 NOTE — Assessment & Plan Note (Signed)
She has been referred for Endoscopy and is waiting to see Dr Vicente Males

## 2021-11-01 NOTE — Assessment & Plan Note (Signed)
Thyroid function is WNL on current dose.  No current changes needed.  Lab Results  Component Value Date   TSH 1.15 08/10/2021

## 2021-11-01 NOTE — Assessment & Plan Note (Signed)

## 2021-11-02 DIAGNOSIS — M5412 Radiculopathy, cervical region: Secondary | ICD-10-CM | POA: Diagnosis not present

## 2021-11-02 DIAGNOSIS — M9901 Segmental and somatic dysfunction of cervical region: Secondary | ICD-10-CM | POA: Diagnosis not present

## 2021-11-02 DIAGNOSIS — M5033 Other cervical disc degeneration, cervicothoracic region: Secondary | ICD-10-CM | POA: Diagnosis not present

## 2021-11-02 DIAGNOSIS — M9902 Segmental and somatic dysfunction of thoracic region: Secondary | ICD-10-CM | POA: Diagnosis not present

## 2021-11-05 DIAGNOSIS — M5033 Other cervical disc degeneration, cervicothoracic region: Secondary | ICD-10-CM | POA: Diagnosis not present

## 2021-11-05 DIAGNOSIS — M9902 Segmental and somatic dysfunction of thoracic region: Secondary | ICD-10-CM | POA: Diagnosis not present

## 2021-11-05 DIAGNOSIS — M9901 Segmental and somatic dysfunction of cervical region: Secondary | ICD-10-CM | POA: Diagnosis not present

## 2021-11-05 DIAGNOSIS — M5412 Radiculopathy, cervical region: Secondary | ICD-10-CM | POA: Diagnosis not present

## 2021-11-06 DIAGNOSIS — M5412 Radiculopathy, cervical region: Secondary | ICD-10-CM | POA: Diagnosis not present

## 2021-11-07 DIAGNOSIS — M9901 Segmental and somatic dysfunction of cervical region: Secondary | ICD-10-CM | POA: Diagnosis not present

## 2021-11-07 DIAGNOSIS — M5412 Radiculopathy, cervical region: Secondary | ICD-10-CM | POA: Diagnosis not present

## 2021-11-07 DIAGNOSIS — M9902 Segmental and somatic dysfunction of thoracic region: Secondary | ICD-10-CM | POA: Diagnosis not present

## 2021-11-07 DIAGNOSIS — M5033 Other cervical disc degeneration, cervicothoracic region: Secondary | ICD-10-CM | POA: Diagnosis not present

## 2021-11-08 DIAGNOSIS — R202 Paresthesia of skin: Secondary | ICD-10-CM | POA: Diagnosis not present

## 2021-11-14 DIAGNOSIS — M9902 Segmental and somatic dysfunction of thoracic region: Secondary | ICD-10-CM | POA: Diagnosis not present

## 2021-11-14 DIAGNOSIS — M5412 Radiculopathy, cervical region: Secondary | ICD-10-CM | POA: Diagnosis not present

## 2021-11-14 DIAGNOSIS — M5033 Other cervical disc degeneration, cervicothoracic region: Secondary | ICD-10-CM | POA: Diagnosis not present

## 2021-11-14 DIAGNOSIS — M9901 Segmental and somatic dysfunction of cervical region: Secondary | ICD-10-CM | POA: Diagnosis not present

## 2021-11-15 DIAGNOSIS — M9901 Segmental and somatic dysfunction of cervical region: Secondary | ICD-10-CM | POA: Diagnosis not present

## 2021-11-15 DIAGNOSIS — M5412 Radiculopathy, cervical region: Secondary | ICD-10-CM | POA: Diagnosis not present

## 2021-11-15 DIAGNOSIS — M9902 Segmental and somatic dysfunction of thoracic region: Secondary | ICD-10-CM | POA: Diagnosis not present

## 2021-11-15 DIAGNOSIS — M5033 Other cervical disc degeneration, cervicothoracic region: Secondary | ICD-10-CM | POA: Diagnosis not present

## 2021-11-16 DIAGNOSIS — M9902 Segmental and somatic dysfunction of thoracic region: Secondary | ICD-10-CM | POA: Diagnosis not present

## 2021-11-16 DIAGNOSIS — M5033 Other cervical disc degeneration, cervicothoracic region: Secondary | ICD-10-CM | POA: Diagnosis not present

## 2021-11-16 DIAGNOSIS — M9901 Segmental and somatic dysfunction of cervical region: Secondary | ICD-10-CM | POA: Diagnosis not present

## 2021-11-16 DIAGNOSIS — M5412 Radiculopathy, cervical region: Secondary | ICD-10-CM | POA: Diagnosis not present

## 2021-11-19 DIAGNOSIS — M9902 Segmental and somatic dysfunction of thoracic region: Secondary | ICD-10-CM | POA: Diagnosis not present

## 2021-11-19 DIAGNOSIS — M5412 Radiculopathy, cervical region: Secondary | ICD-10-CM | POA: Diagnosis not present

## 2021-11-19 DIAGNOSIS — M9901 Segmental and somatic dysfunction of cervical region: Secondary | ICD-10-CM | POA: Diagnosis not present

## 2021-11-19 DIAGNOSIS — M5033 Other cervical disc degeneration, cervicothoracic region: Secondary | ICD-10-CM | POA: Diagnosis not present

## 2021-11-21 DIAGNOSIS — M9901 Segmental and somatic dysfunction of cervical region: Secondary | ICD-10-CM | POA: Diagnosis not present

## 2021-11-21 DIAGNOSIS — M5412 Radiculopathy, cervical region: Secondary | ICD-10-CM | POA: Diagnosis not present

## 2021-11-21 DIAGNOSIS — M5033 Other cervical disc degeneration, cervicothoracic region: Secondary | ICD-10-CM | POA: Diagnosis not present

## 2021-11-21 DIAGNOSIS — M9902 Segmental and somatic dysfunction of thoracic region: Secondary | ICD-10-CM | POA: Diagnosis not present

## 2021-11-22 ENCOUNTER — Other Ambulatory Visit: Payer: Self-pay | Admitting: Internal Medicine

## 2021-11-26 DIAGNOSIS — M5033 Other cervical disc degeneration, cervicothoracic region: Secondary | ICD-10-CM | POA: Diagnosis not present

## 2021-11-26 DIAGNOSIS — M5412 Radiculopathy, cervical region: Secondary | ICD-10-CM | POA: Diagnosis not present

## 2021-11-26 DIAGNOSIS — M9902 Segmental and somatic dysfunction of thoracic region: Secondary | ICD-10-CM | POA: Diagnosis not present

## 2021-11-26 DIAGNOSIS — M9901 Segmental and somatic dysfunction of cervical region: Secondary | ICD-10-CM | POA: Diagnosis not present

## 2021-11-28 DIAGNOSIS — M5412 Radiculopathy, cervical region: Secondary | ICD-10-CM | POA: Diagnosis not present

## 2021-11-28 DIAGNOSIS — M5033 Other cervical disc degeneration, cervicothoracic region: Secondary | ICD-10-CM | POA: Diagnosis not present

## 2021-11-28 DIAGNOSIS — M9901 Segmental and somatic dysfunction of cervical region: Secondary | ICD-10-CM | POA: Diagnosis not present

## 2021-11-28 DIAGNOSIS — M9902 Segmental and somatic dysfunction of thoracic region: Secondary | ICD-10-CM | POA: Diagnosis not present

## 2021-11-29 ENCOUNTER — Encounter: Payer: Self-pay | Admitting: Gastroenterology

## 2021-11-29 ENCOUNTER — Ambulatory Visit (INDEPENDENT_AMBULATORY_CARE_PROVIDER_SITE_OTHER): Payer: Medicare HMO | Admitting: Gastroenterology

## 2021-11-29 ENCOUNTER — Other Ambulatory Visit: Payer: Self-pay

## 2021-11-29 VITALS — BP 135/76 | HR 88 | Temp 97.8°F | Wt 150.0 lb

## 2021-11-29 DIAGNOSIS — M9901 Segmental and somatic dysfunction of cervical region: Secondary | ICD-10-CM | POA: Diagnosis not present

## 2021-11-29 DIAGNOSIS — R1310 Dysphagia, unspecified: Secondary | ICD-10-CM

## 2021-11-29 DIAGNOSIS — M5412 Radiculopathy, cervical region: Secondary | ICD-10-CM | POA: Diagnosis not present

## 2021-11-29 DIAGNOSIS — M47812 Spondylosis without myelopathy or radiculopathy, cervical region: Secondary | ICD-10-CM | POA: Diagnosis not present

## 2021-11-29 DIAGNOSIS — M5033 Other cervical disc degeneration, cervicothoracic region: Secondary | ICD-10-CM | POA: Diagnosis not present

## 2021-11-29 DIAGNOSIS — M9902 Segmental and somatic dysfunction of thoracic region: Secondary | ICD-10-CM | POA: Diagnosis not present

## 2021-11-29 MED ORDER — OMEPRAZOLE 20 MG PO CPDR: 20.0000 mg | DELAYED_RELEASE_CAPSULE | Freq: Two times a day (BID) | ORAL | 0 refills | Status: DC

## 2021-11-29 NOTE — Progress Notes (Signed)
?  ?Jonathon Bellows MD, MRCP(U.K) ?[address]  ?[address]  ?[place], [place] [address]  ?Main: [phone]  ?Fax: [phone] ? ? ?Primary Care Physician: Crecencio Mc, MD ? ?Primary Gastroenterologist:  Dr. Jonathon Bellows  ? ?Chief Complaint  ?Patient presents with  ? Follow-up  ? ? ?HPI: Tammy Tate is a 81 y.o. female ? ?Summary of history : ?  ?Initially referred and seen on 06/02/2019 for iron deficiency anemia.  2 months prior hemoglobin is 11.5 g with an MCV of 95.6 and a ferritin of 9.  Has been on B12 supplementation for deficiency.Etiology was likely related to blood donation  ? ?06/02/2019: H. pylori breath test negative ?06/10/2019: EGD: Features suggestive of esophageal Candida was seen.  Brushings positive for yeast commenced on Diflucan for 14 days.  6 sessile subcentimeter polyps were seen and resected, they were tubular adenomas.  Biopsies of the duodenum were negative. ?  ?07/21/2019 iron studies have normalized.  Hemoglobin 13.9 g with an MCV of 98.9.  CMP normal ?  ?  ?Interval history  09/01/2019-11/29/2021 ?    ?She states that she was seen recently by Dr. Tami Ribas in ENT for dysphagia who ordered a barium swallow that showed irregular contour of the anterior hypopharynx and upper cervical esophagus at the C5-C6 level.  She been having some difficulty swallowing solids mostly for the past few months if not longer and sometimes gets stuck in the upper part of her throat.  Complains of sometimes having a dry throat.  No typical reflux symptoms.  Not on a PPI.  No other complaints.  Family history of esophageal cancer. ? ? ?Current Outpatient Medications  ?Medication Sig Dispense Refill  ? ALPRAZolam (XANAX) 0.25 MG tablet TAKE ONE TABLET BY MOUTH TWICE A DAY AS NEEDED FOR ANXIETYT 20 tablet 0  ? Biotin 5 MG TABS Take by mouth.    ? Calcium-Magnesium-Vitamin D (CALCIUM 1200+D3 PO) Take 1 tablet by mouth. Ca 1200mg   ?D3 44mcg    ? clopidogrel (PLAVIX) 75 MG tablet Take 1 tablet (75 mg total) by mouth  daily. 30 tablet 2  ? levothyroxine (SYNTHROID) 88 MCG tablet TAKE ONE TABLET BY MOUTH EVERY MORNING AT LEAST 30-60 MINUTES BEFORE BREAKFAST ON AN EMPTY STOMACH WITH A GLASS OF WATER 90 tablet 0  ? Multiple Vitamins-Minerals (PRESERVISION AREDS 2 PO)     ? Omega-3 Fatty Acids (OMEGA 3 PO) Take by mouth daily.    ? Turmeric 500 MG CAPS Take 2 capsules by mouth daily.     ? vitamin B-12 (CYANOCOBALAMIN) 1000 MCG tablet Take 3,000 mcg by mouth daily.    ? ?No current facility-administered medications for this visit.  ? ? ?Allergies as of 11/29/2021 - Review Complete 11/29/2021  ?Allergen Reaction Noted  ? Atorvastatin Other (See Comments) 11/01/2021  ? ? ?ROS: ? ?General: Negative for anorexia, weight loss, fever, chills, fatigue, weakness. ?ENT: Negative for hoarseness, difficulty swallowing , nasal congestion. ?CV: Negative for chest pain, angina, palpitations, dyspnea on exertion, peripheral edema.  ?Respiratory: Negative for dyspnea at rest, dyspnea on exertion, cough, sputum, wheezing.  ?GI: See history of present illness. ?GU:  Negative for dysuria, hematuria, urinary incontinence, urinary frequency, nocturnal urination.  ?Endo: Negative for unusual weight change.  ?  ?Physical Examination: ? ? BP 135/76   Pulse 88   Temp 97.8 ?F (36.6 ?C) (Oral)   Wt 150 lb (68 kg)   BMI 26.57 kg/m?  ? ?General: Well-nourished, well-developed in no acute distress.  ?  Eyes: No icterus. Conjunctivae pink. ?Mouth: Oropharyngeal mucosa moist and pink , no lesions erythema or exudate. ?Lungs: Clear to auscultation bilaterally. Non-labored. ?Neuro: Alert and oriented x 3.  Grossly intact. ?Skin: Warm and dry, no jaundice.   ?Psych: Alert and cooperative, normal mood and affect. ? ? ?Imaging Studies: ?No results found. ? ?Assessment and Plan:  ? ?Tammy Tate is a 81 y.o. y/o female here today to see me for dysphagia.  Recent evaluation by ENT including endoscopic evaluation was negative.  Modified barium swallow showed irregular  contour of the C5-C6 level and endoscopy was recommended.  We will proceed with an EGD in addition will commence her on PPI and antireflux lifestyle changes to determine if any of the dysphagia is related to acid reflux.  I will also rule out eosinophilic esophagitis at the same. ? ? ?I have discussed alternative options, risks & benefits,  which include, but are not limited to, bleeding, infection, perforation,respiratory complication & drug reaction.  The patient agrees with this plan & written consent will be obtained.   ? ? ? ?Dr Jonathon Bellows  MD,MRCP [facility]) ?Follow up in 3 months ?

## 2021-12-03 DIAGNOSIS — M9902 Segmental and somatic dysfunction of thoracic region: Secondary | ICD-10-CM | POA: Diagnosis not present

## 2021-12-03 DIAGNOSIS — M9901 Segmental and somatic dysfunction of cervical region: Secondary | ICD-10-CM | POA: Diagnosis not present

## 2021-12-03 DIAGNOSIS — M5033 Other cervical disc degeneration, cervicothoracic region: Secondary | ICD-10-CM | POA: Diagnosis not present

## 2021-12-03 DIAGNOSIS — M5412 Radiculopathy, cervical region: Secondary | ICD-10-CM | POA: Diagnosis not present

## 2021-12-04 ENCOUNTER — Encounter: Payer: Self-pay | Admitting: Gastroenterology

## 2021-12-04 ENCOUNTER — Other Ambulatory Visit: Payer: Self-pay

## 2021-12-04 ENCOUNTER — Ambulatory Visit: Payer: Medicare HMO | Admitting: Certified Registered Nurse Anesthetist

## 2021-12-04 ENCOUNTER — Encounter
Admission: RE | Disposition: A | Discharge status: Home / Self Care | Payer: Self-pay | Source: Home / Self Care | Attending: Gastroenterology

## 2021-12-04 ENCOUNTER — Ambulatory Visit
Admission: RE | Admit: 2021-12-04 | Discharge: 2021-12-04 | Disposition: A | Discharge status: Home / Self Care | Payer: Medicare HMO | Attending: Gastroenterology | Admitting: Gastroenterology

## 2021-12-04 DIAGNOSIS — E538 Deficiency of other specified B group vitamins: Secondary | ICD-10-CM | POA: Insufficient documentation

## 2021-12-04 DIAGNOSIS — E079 Disorder of thyroid, unspecified: Secondary | ICD-10-CM | POA: Diagnosis not present

## 2021-12-04 DIAGNOSIS — R1310 Dysphagia, unspecified: Secondary | ICD-10-CM | POA: Diagnosis not present

## 2021-12-04 DIAGNOSIS — Z86000 Personal history of in-situ neoplasm of breast: Secondary | ICD-10-CM | POA: Insufficient documentation

## 2021-12-04 DIAGNOSIS — K222 Esophageal obstruction: Secondary | ICD-10-CM | POA: Insufficient documentation

## 2021-12-04 DIAGNOSIS — K449 Diaphragmatic hernia without obstruction or gangrene: Secondary | ICD-10-CM | POA: Diagnosis not present

## 2021-12-04 DIAGNOSIS — Z9049 Acquired absence of other specified parts of digestive tract: Secondary | ICD-10-CM | POA: Insufficient documentation

## 2021-12-04 DIAGNOSIS — K219 Gastro-esophageal reflux disease without esophagitis: Secondary | ICD-10-CM | POA: Insufficient documentation

## 2021-12-04 HISTORY — PX: ESOPHAGOGASTRODUODENOSCOPY (EGD) WITH PROPOFOL: SHX5813

## 2021-12-04 SURGERY — ESOPHAGOGASTRODUODENOSCOPY (EGD) WITH PROPOFOL
Anesthesia: General

## 2021-12-04 MED ORDER — PROPOFOL 500 MG/50ML IV EMUL
INTRAVENOUS | Status: DC | PRN
  Administered 2021-12-04: 150 ug/kg/min via INTRAVENOUS

## 2021-12-04 MED ORDER — LIDOCAINE HCL (CARDIAC) PF 100 MG/5ML IV SOSY
PREFILLED_SYRINGE | INTRAVENOUS | Status: DC | PRN
Start: 2021-12-04 — End: 2021-12-04
  Administered 2021-12-04: 80 mg via INTRAVENOUS

## 2021-12-04 MED ORDER — SODIUM CHLORIDE 0.9 % IV SOLN: INTRAVENOUS | Status: DC

## 2021-12-04 MED ORDER — PROPOFOL 10 MG/ML IV BOLUS
INTRAVENOUS | Status: DC | PRN
  Administered 2021-12-04: 60 mg via INTRAVENOUS

## 2021-12-04 NOTE — Anesthesia Preprocedure Evaluation (Signed)
Anesthesia Evaluation  ?Patient identified by MRN, date of birth, ID band ?Patient awake ? ? ? ?Reviewed: ?Allergy & Precautions, H&P , NPO status , Patient's Chart, lab work & pertinent test results ? ?History of Anesthesia Complications ?Negative for: history of anesthetic complications ? ?Airway ?Mallampati: II ? ?TM Distance: >3 FB ? ? ? ? Dental ? ?(+) Teeth Intact, Dental Advidsory Given, Chipped ?  ?Pulmonary ?neg pulmonary ROS, neg shortness of breath, neg COPD, neg recent URI, former smoker,  ?  ? ? ? ? ? ? ? Cardiovascular ?(-) angina(-) Past MI and (-) Cardiac Stents negative cardio ROS ? ?(-) dysrhythmias  ? ? ?  ?Neuro/Psych ? Headaches, neg Seizures negative psych ROS  ? GI/Hepatic ?Neg liver ROS, GERD  Controlled,  ?Endo/Other  ?neg diabetesHypothyroidism  ? Renal/GU ?negative Renal ROS  ?negative genitourinary ?  ?Musculoskeletal ? ? Abdominal ?  ?Peds ? Hematology ?negative hematology ROS ?(+)   ?Anesthesia Other Findings ?Past Medical History: ?No date: Allergy ?No date: B12 deficiency ?No date: Bronchitis ?    Comment:  hx of recurrent and bronchospasm ?2010: Cancer ([facility]) ?    Comment:  DCIS , right ?No date: Chicken pox ?No date: Degenerative arthritis of lumbar spine ?No date: Degenerative disk disease ?    Comment:  KAM 08-04-2007 ?No date: GERD (gastroesophageal reflux disease) ?No date: Migraine with aura ?No date: Thyroid disease ?No date: Vasomotor rhinitis ? ?Past Surgical History: ?No date: ABDOMINAL HYSTERECTOMY ?    Comment:  with prior HRT ?No date: APPENDECTOMY ?2010: MODIFIED RADICAL MASTECTOMY W/ AXILLARY LYMPH NODE DISSECTION W/ ? PECTORALIS MINOR PRESERVATION ?    Comment:  bilateral, for DCIS,  [name] ? ?BMI   ? Body Mass Index: 27.46 kg/m?  ?  ? ? Reproductive/Obstetrics ?negative OB ROS ? ?  ? ? ? ? ? ? ? ? ? ? ? ? ? ?  ?  ? ? ? ? ? ? ? ? ?Anesthesia Physical ? ?Anesthesia Plan ? ?ASA: 2 ? ?Anesthesia Plan: General  ? ?Post-op Pain  Management:   ? ?Induction: Intravenous ? ?PONV Risk Score and Plan: Propofol infusion and TIVA ? ?Airway Management Planned: Natural Airway and Nasal Cannula ? ?Additional Equipment:  ? ?Intra-op Plan:  ? ?Post-operative Plan:  ? ?Informed Consent: I have reviewed the patients History and Physical, chart, labs and discussed the procedure including the risks, benefits and alternatives for the proposed anesthesia with the patient or authorized representative who has indicated his/her understanding and acceptance.  ? ? ? ?Dental Advisory Given ? ?Plan Discussed with: Anesthesiologist and CRNA ? ?Anesthesia Plan Comments:   ? ? ? ? ? ? ?Anesthesia Quick Evaluation ? ?

## 2021-12-04 NOTE — H&P (Signed)
? ? ? ?Jonathon Bellows, MD ?[address] [place], [place], [place], [address] ?[address], [place], [place], [place], [address] ?Phone: [phone]  ?Fax: [phone] ? ?Primary Care Physician:  Crecencio Mc, MD ? ? ?Pre-Procedure History & Physical: ?HPI:  Tammy Tate is a 81 y.o. female is here for an endoscopy  ?  ?Past Medical History:  ?Diagnosis Date  ? Allergy   ? B12 deficiency   ? Bronchitis   ? hx of recurrent and bronchospasm  ? Cancer [facility]) 2010  ? DCIS , right  ? Chicken pox   ? Degenerative arthritis of lumbar spine   ? Degenerative disk disease   ? KAM 08-04-2007  ? GERD (gastroesophageal reflux disease)   ? Migraine with aura   ? Thyroid disease   ? Vasomotor rhinitis   ? ? ?Past Surgical History:  ?Procedure Laterality Date  ? ABDOMINAL HYSTERECTOMY    ? with prior HRT  ? APPENDECTOMY    ? COLONOSCOPY WITH PROPOFOL N/A 06/10/2019  ? Procedure: COLONOSCOPY WITH PROPOFOL;  Surgeon: Jonathon Bellows, MD;  [facility];  Service: Gastroenterology;  Laterality: N/A;  ? ESOPHAGOGASTRODUODENOSCOPY (EGD) WITH PROPOFOL N/A 06/10/2019  ? Procedure: ESOPHAGOGASTRODUODENOSCOPY (EGD) WITH PROPOFOL;  Surgeon: Jonathon Bellows, MD;  Location: [facility] ENDOSCOPY;  Service: Gastroenterology;  Laterality: N/A;  ? MODIFIED RADICAL MASTECTOMY W/ AXILLARY LYMPH NODE DISSECTION W/ PECTORALIS MINOR PRESERVATION  2010  ? bilateral, for DCIS,  [name] , [facility]  ? ? ?Prior to Admission medications   ?Medication Sig Start Date End Date Taking? Authorizing Provider  ?ALPRAZolam (XANAX) 0.25 MG tablet TAKE ONE TABLET BY MOUTH TWICE A DAY AS NEEDED FOR ANXIETYT 05/28/21  Yes Crecencio Mc, MD  ?Biotin 5 MG TABS Take by mouth.   Yes [provider]  ?Calcium-Magnesium-Vitamin D (CALCIUM 1200+D3 PO) Take 1 tablet by mouth. Ca '1200mg'$   ?D3 41mg   Yes [provider]  ?levothyroxine (SYNTHROID) 88 MCG tablet TAKE ONE TABLET BY MOUTH EVERY MORNING AT LEAST 30-60 MINUTES BEFORE BREAKFAST ON AN EMPTY STOMACH  WITH A GLASS OF WATER 11/23/21  Yes TCrecencio Mc MD  ?Multiple Vitamins-Minerals (PRESERVISION AREDS 2 PO)  11/02/18  Yes [provider]  ?Omega-3 Fatty Acids (OMEGA 3 PO) Take by mouth daily.   Yes [provider]  ?omeprazole (PRILOSEC) 20 MG capsule Take 1 capsule (20 mg total) by mouth 2 (two) times daily before a meal. 11/29/21  Yes AJonathon Bellows MD  ?Turmeric 500 MG CAPS Take 2 capsules by mouth daily.    Yes [provider]  ?vitamin B-12 (CYANOCOBALAMIN) 1000 MCG tablet Take 3,000 mcg by mouth daily.   Yes [provider]  ?clopidogrel (PLAVIX) 75 MG tablet Take 1 tablet (75 mg total) by mouth daily. 08/10/21   TCrecencio Mc MD  ? ? ?Allergies as of 11/29/2021 - Review Complete 11/29/2021  ?Allergen Reaction Noted  ? Atorvastatin Other (See Comments) 11/01/2021  ? ? ?Family History  ?Problem Relation Age of Onset  ? Cancer Mother 710 ?     esophgeal CA mets to lungs, spine   ? Stroke Father 654 ?     tobacco abuse, hyperlipidemia  ? Hyperlipidemia Father   ? Heart disease Father   ? Diabetes Maternal Grandfather   ? Pancreatitis Daughter   ? ? ?Social History  ? ?Socioeconomic History  ? Marital status: Married  ?  Spouse name: Not on file  ? Number of children: Not  on file  ? Years of education: Not on file  ? Highest education level: Not on file  ?Occupational History  ? Not on file  ?Tobacco Use  ? Smoking status: Former  ?  Types: Cigarettes  ?  Quit date: 10/23/1971  ?  Years since quitting: 50.1  ? Smokeless tobacco: Never  ?Vaping Use  ? Vaping Use: Never used  ?Substance and Sexual Activity  ? Alcohol use: Yes  ?  Alcohol/week: 7.0 standard drinks  ?  Types: 7 Glasses of wine per week  ?  Comment: occasional  ? Drug use: No  ? Sexual activity: Not Currently  ?Other Topics Concern  ? Not on file  ?Social History Narrative  ? Not on file  ? ?Social Determinants of Health  ? ?Financial Resource Strain: Low Risk   ? Difficulty of Paying Living Expenses: Not hard at  all  ?Food Insecurity: No Food Insecurity  ? Worried About Charity fundraiser in the Last Year: Never true  ? Ran Out of Food in the Last Year: Never true  ?Transportation Needs: No Transportation Needs  ? Lack of Transportation (Medical): No  ? Lack of Transportation (Non-Medical): No  ?Physical Activity: Insufficiently Active  ? Days of Exercise per Week: 2 days  ? Minutes of Exercise per Session: 20 min  ?Stress: No Stress Concern Present  ? Feeling of Stress : Not at all  ?Social Connections: Unknown  ? Frequency of Communication with Friends and Family: Not on file  ? Frequency of Social Gatherings with Friends and Family: Not on file  ? Attends Religious Services: More than 4 times per year  ? Active Member of Clubs or Organizations: Not on file  ? Attends Archivist Meetings: Not on file  ? Marital Status: Married  ?Intimate Partner Violence: Not At Risk  ? Fear of Current or Ex-Partner: No  ? Emotionally Abused: No  ? Physically Abused: No  ? Sexually Abused: No  ? ? ?Review of Systems: ?See HPI, otherwise negative ROS ? ?Physical Exam: ?BP 110/68   Pulse 65   Temp (!) 96.9 ?F (36.1 ?C) (Temporal)   Resp 18   Ht '5\' 3"'$  (1.6 m)   Wt 68 kg   SpO2 100%   BMI 26.57 kg/m?  ?General:   Alert,  pleasant and cooperative in NAD ?Head:  Normocephalic and atraumatic. ?Neck:  Supple; no masses or thyromegaly. ?Lungs:  Clear throughout to auscultation, normal respiratory effort.    ?Heart:  +S1, +S2, Regular rate and rhythm, No edema. ?Abdomen:  Soft, nontender and nondistended. Normal bowel sounds, without guarding, and without rebound.   ?Neurologic:  Alert and  oriented x4;  grossly normal neurologically. ? ?Impression/Plan: ?Tammy Tate is here for an endoscopy  to be performed for  evaluation of dysphagia ?   ?Risks, benefits, limitations, and alternatives regarding endoscopy have been reviewed with the patient.  Questions have been answered.  All parties agreeable. ? ? ?Jonathon Bellows, MD   12/04/2021, 8:49 AM ? ?

## 2021-12-04 NOTE — Op Note (Signed)
[facility] ?Gastroenterology ?Patient Name: Tammy Tate ?Procedure Date: 12/04/2021 8:49 AM ?MRN: [id number] ?Account #: [id number] ?Date of Birth: [birth year] ?Admit Type: Outpatient ?Age: 81 ?Room: [facility] ENDO ROOM 2 ?Gender: Female ?Note Status: Finalized ?Instrument Name: Upper-Endoscope [id number] ?Procedure:             Upper GI endoscopy ?Indications:           Dysphagia ?Providers:             Jonathon Bellows MD, MD ?Medicines:             Monitored Anesthesia Care ?Complications:         No immediate complications. ?Procedure:             Pre-Anesthesia Assessment: ?                       - Prior to the procedure, a History and Physical was  ?                       performed, and patient medications, allergies and  ?                       sensitivities were reviewed. The patient's tolerance  ?                       of previous anesthesia was reviewed. ?                       - The risks and benefits of the procedure and the  ?                       sedation options and risks were discussed with the  ?                       patient. All questions were answered and informed  ?                       consent was obtained. ?                       - ASA Grade Assessment: II - A patient with mild  ?                       systemic disease. ?                       After obtaining informed consent, the endoscope was  ?                       passed under direct vision. Throughout the procedure,  ?                       the patient's blood pressure, pulse, and oxygen  ?                       saturations were monitored continuously. The Endoscope  ?                       was introduced through the mouth, and advanced to the  ?  third part of duodenum. The upper GI endoscopy was  ?                       accomplished with ease. The patient tolerated the  ?                       procedure well. ?Findings: ?     A mild Schatzki ring was found at the gastroesophageal junction. A TTS  ?      dilator was passed through the scope. Dilation with a 15-16.5-18 mm  ?     balloon dilator was performed to 18 mm. The dilation site was examined  ?     and showed no change. The baloon was dragged up at 18 mm all the way to  ?     20 cm mark of the esophagus ?     Normal mucosa was found in the entire esophagus. Biopsies were taken  ?     with a cold forceps for histology. ?     A large hiatal hernia was present. ?     The cardia and gastric fundus were normal on retroflexion. ?     The examined duodenum was normal. ?Impression:            - Mild Schatzki ring. Dilated. ?                       - Normal mucosa was found in the entire esophagus.  ?                       Biopsied. ?                       - Large hiatal hernia. ?                       - Normal examined duodenum. ?Recommendation:        - Await pathology results. ?                       - Discharge patient to home (with escort). ?                       - Resume previous diet. ?                       - Continue present medications. ?                       - Await pathology results. ?                       - Return to my office PRN. ?Procedure Code(s):     --- Professional --- ?                       [phone], Esophagogastroduodenoscopy, flexible,  ?                       transoral; with transendoscopic balloon dilation of  ?                       esophagus (less than 30 mm diameter) ?  43239, 59, Esophagogastroduodenoscopy, flexible,  ?                       transoral; with biopsy, single or multiple ?Diagnosis Code(s):     --- Professional --- ?                       K22.2, Esophageal obstruction ?                       K44.9, Diaphragmatic hernia without obstruction or  ?                       gangrene ?                       R13.10, Dysphagia, unspecified ?CPT copyright 2019 American Medical Association. All rights reserved. ?The codes documented in this report are preliminary and upon coder review may  ?be revised to meet current  compliance requirements. ?Jonathon Bellows, MD ?Jonathon Bellows MD, MD ?12/04/2021 9:21:08 AM ?This report has been signed electronically. ?Number of Addenda: 0 ?Note Initiated On: 12/04/2021 8:49 AM ?Estimated Blood Loss:  Estimated blood loss: none. ?     [facility] ?

## 2021-12-04 NOTE — Transfer of Care (Signed)
Immediate Anesthesia Transfer of Care Note ? ?Patient: Tammy Tate ? ?Procedure(s) Performed: ESOPHAGOGASTRODUODENOSCOPY (EGD) WITH PROPOFOL ? ?Patient Location: Endoscopy Unit ? ?Anesthesia Type:General ? ?Level of Consciousness: drowsy ? ?Airway & Oxygen Therapy: Patient Spontanous Breathing ? ?Post-op Assessment: Report given to RN and Post -op Vital signs reviewed and stable ? ?Post vital signs: Reviewed and stable ? ?Last Vitals:  ?Vitals Value Taken Time  ?BP 89/57 12/04/21 0922  ?Temp    ?Pulse 94 12/04/21 0922  ?Resp 19 12/04/21 0922  ?SpO2 94 % 12/04/21 0922  ?Vitals shown include unvalidated device data. ? ?Last Pain:  ?Vitals:  ? 12/04/21 0922  ?TempSrc:   ?PainSc: Asleep  ?   ? ?  ? ?Complications: No notable events documented. ?

## 2021-12-04 NOTE — Anesthesia Postprocedure Evaluation (Signed)
Anesthesia Post Note ? ?Patient: Tammy Tate ? ?Procedure(s) Performed: ESOPHAGOGASTRODUODENOSCOPY (EGD) WITH PROPOFOL ? ?Patient location during evaluation: Endoscopy ?Anesthesia Type: General ?Level of consciousness: awake and alert ?Pain management: pain level controlled ?Vital Signs Assessment: post-procedure vital signs reviewed and stable ?Respiratory status: spontaneous breathing, nonlabored ventilation, respiratory function stable and patient connected to nasal cannula oxygen ?Cardiovascular status: blood pressure returned to baseline and stable ?Postop Assessment: no apparent nausea or vomiting ?Anesthetic complications: no ? ? ?No notable events documented. ? ? ?Last Vitals:  ?Vitals:  ? 12/04/21 0932 12/04/21 0942  ?BP: 111/79 115/76  ?Pulse: 93 92  ?Resp: 20 15  ?Temp:    ?SpO2: 97% 100%  ?  ?Last Pain:  ?Vitals:  ? 12/04/21 0942  ?TempSrc:   ?PainSc: 0-No pain  ? ? ?  ?  ?  ?  ?  ?  ? ?Martha Clan ? ? ? ? ?

## 2021-12-05 ENCOUNTER — Encounter: Payer: Self-pay | Admitting: Gastroenterology

## 2021-12-05 DIAGNOSIS — M9901 Segmental and somatic dysfunction of cervical region: Secondary | ICD-10-CM | POA: Diagnosis not present

## 2021-12-05 DIAGNOSIS — M5412 Radiculopathy, cervical region: Secondary | ICD-10-CM | POA: Diagnosis not present

## 2021-12-05 DIAGNOSIS — M9902 Segmental and somatic dysfunction of thoracic region: Secondary | ICD-10-CM | POA: Diagnosis not present

## 2021-12-05 DIAGNOSIS — M5033 Other cervical disc degeneration, cervicothoracic region: Secondary | ICD-10-CM | POA: Diagnosis not present

## 2021-12-05 LAB — SURGICAL PATHOLOGY

## 2021-12-07 ENCOUNTER — Encounter: Payer: Self-pay | Admitting: Gastroenterology

## 2021-12-10 DIAGNOSIS — M5412 Radiculopathy, cervical region: Secondary | ICD-10-CM | POA: Diagnosis not present

## 2021-12-10 DIAGNOSIS — M9902 Segmental and somatic dysfunction of thoracic region: Secondary | ICD-10-CM | POA: Diagnosis not present

## 2021-12-10 DIAGNOSIS — M9901 Segmental and somatic dysfunction of cervical region: Secondary | ICD-10-CM | POA: Diagnosis not present

## 2021-12-10 DIAGNOSIS — M5033 Other cervical disc degeneration, cervicothoracic region: Secondary | ICD-10-CM | POA: Diagnosis not present

## 2021-12-12 DIAGNOSIS — M5412 Radiculopathy, cervical region: Secondary | ICD-10-CM | POA: Diagnosis not present

## 2021-12-12 DIAGNOSIS — M9901 Segmental and somatic dysfunction of cervical region: Secondary | ICD-10-CM | POA: Diagnosis not present

## 2021-12-12 DIAGNOSIS — M5033 Other cervical disc degeneration, cervicothoracic region: Secondary | ICD-10-CM | POA: Diagnosis not present

## 2021-12-12 DIAGNOSIS — M9902 Segmental and somatic dysfunction of thoracic region: Secondary | ICD-10-CM | POA: Diagnosis not present

## 2021-12-15 ENCOUNTER — Other Ambulatory Visit: Payer: Self-pay | Admitting: Internal Medicine

## 2021-12-17 DIAGNOSIS — M9901 Segmental and somatic dysfunction of cervical region: Secondary | ICD-10-CM | POA: Diagnosis not present

## 2021-12-17 DIAGNOSIS — M5412 Radiculopathy, cervical region: Secondary | ICD-10-CM | POA: Diagnosis not present

## 2021-12-17 DIAGNOSIS — M5033 Other cervical disc degeneration, cervicothoracic region: Secondary | ICD-10-CM | POA: Diagnosis not present

## 2021-12-17 DIAGNOSIS — M9902 Segmental and somatic dysfunction of thoracic region: Secondary | ICD-10-CM | POA: Diagnosis not present

## 2021-12-19 DIAGNOSIS — M5412 Radiculopathy, cervical region: Secondary | ICD-10-CM | POA: Diagnosis not present

## 2021-12-19 DIAGNOSIS — M5033 Other cervical disc degeneration, cervicothoracic region: Secondary | ICD-10-CM | POA: Diagnosis not present

## 2021-12-19 DIAGNOSIS — M9901 Segmental and somatic dysfunction of cervical region: Secondary | ICD-10-CM | POA: Diagnosis not present

## 2021-12-19 DIAGNOSIS — M9902 Segmental and somatic dysfunction of thoracic region: Secondary | ICD-10-CM | POA: Diagnosis not present

## 2021-12-20 DIAGNOSIS — M9901 Segmental and somatic dysfunction of cervical region: Secondary | ICD-10-CM | POA: Diagnosis not present

## 2021-12-20 DIAGNOSIS — M5412 Radiculopathy, cervical region: Secondary | ICD-10-CM | POA: Diagnosis not present

## 2021-12-20 DIAGNOSIS — M9902 Segmental and somatic dysfunction of thoracic region: Secondary | ICD-10-CM | POA: Diagnosis not present

## 2021-12-20 DIAGNOSIS — M5033 Other cervical disc degeneration, cervicothoracic region: Secondary | ICD-10-CM | POA: Diagnosis not present

## 2021-12-24 DIAGNOSIS — M9901 Segmental and somatic dysfunction of cervical region: Secondary | ICD-10-CM | POA: Diagnosis not present

## 2021-12-24 DIAGNOSIS — M5412 Radiculopathy, cervical region: Secondary | ICD-10-CM | POA: Diagnosis not present

## 2021-12-24 DIAGNOSIS — M5033 Other cervical disc degeneration, cervicothoracic region: Secondary | ICD-10-CM | POA: Diagnosis not present

## 2021-12-24 DIAGNOSIS — M9902 Segmental and somatic dysfunction of thoracic region: Secondary | ICD-10-CM | POA: Diagnosis not present

## 2021-12-26 DIAGNOSIS — M5412 Radiculopathy, cervical region: Secondary | ICD-10-CM | POA: Diagnosis not present

## 2021-12-26 DIAGNOSIS — M5033 Other cervical disc degeneration, cervicothoracic region: Secondary | ICD-10-CM | POA: Diagnosis not present

## 2021-12-26 DIAGNOSIS — M9902 Segmental and somatic dysfunction of thoracic region: Secondary | ICD-10-CM | POA: Diagnosis not present

## 2021-12-26 DIAGNOSIS — M9901 Segmental and somatic dysfunction of cervical region: Secondary | ICD-10-CM | POA: Diagnosis not present

## 2021-12-31 DIAGNOSIS — M9901 Segmental and somatic dysfunction of cervical region: Secondary | ICD-10-CM | POA: Diagnosis not present

## 2021-12-31 DIAGNOSIS — M5033 Other cervical disc degeneration, cervicothoracic region: Secondary | ICD-10-CM | POA: Diagnosis not present

## 2021-12-31 DIAGNOSIS — M5412 Radiculopathy, cervical region: Secondary | ICD-10-CM | POA: Diagnosis not present

## 2021-12-31 DIAGNOSIS — M9902 Segmental and somatic dysfunction of thoracic region: Secondary | ICD-10-CM | POA: Diagnosis not present

## 2022-01-01 DIAGNOSIS — M47812 Spondylosis without myelopathy or radiculopathy, cervical region: Secondary | ICD-10-CM | POA: Diagnosis not present

## 2022-01-01 DIAGNOSIS — M47817 Spondylosis without myelopathy or radiculopathy, lumbosacral region: Secondary | ICD-10-CM | POA: Diagnosis not present

## 2022-01-02 DIAGNOSIS — M9902 Segmental and somatic dysfunction of thoracic region: Secondary | ICD-10-CM | POA: Diagnosis not present

## 2022-01-02 DIAGNOSIS — M5412 Radiculopathy, cervical region: Secondary | ICD-10-CM | POA: Diagnosis not present

## 2022-01-02 DIAGNOSIS — M9901 Segmental and somatic dysfunction of cervical region: Secondary | ICD-10-CM | POA: Diagnosis not present

## 2022-01-02 DIAGNOSIS — M5033 Other cervical disc degeneration, cervicothoracic region: Secondary | ICD-10-CM | POA: Diagnosis not present

## 2022-01-07 DIAGNOSIS — M5033 Other cervical disc degeneration, cervicothoracic region: Secondary | ICD-10-CM | POA: Diagnosis not present

## 2022-01-07 DIAGNOSIS — M5412 Radiculopathy, cervical region: Secondary | ICD-10-CM | POA: Diagnosis not present

## 2022-01-07 DIAGNOSIS — M9902 Segmental and somatic dysfunction of thoracic region: Secondary | ICD-10-CM | POA: Diagnosis not present

## 2022-01-07 DIAGNOSIS — M9901 Segmental and somatic dysfunction of cervical region: Secondary | ICD-10-CM | POA: Diagnosis not present

## 2022-01-09 DIAGNOSIS — M5033 Other cervical disc degeneration, cervicothoracic region: Secondary | ICD-10-CM | POA: Diagnosis not present

## 2022-01-09 DIAGNOSIS — M9902 Segmental and somatic dysfunction of thoracic region: Secondary | ICD-10-CM | POA: Diagnosis not present

## 2022-01-09 DIAGNOSIS — M9901 Segmental and somatic dysfunction of cervical region: Secondary | ICD-10-CM | POA: Diagnosis not present

## 2022-01-09 DIAGNOSIS — M5412 Radiculopathy, cervical region: Secondary | ICD-10-CM | POA: Diagnosis not present

## 2022-01-14 DIAGNOSIS — H353131 Nonexudative age-related macular degeneration, bilateral, early dry stage: Secondary | ICD-10-CM | POA: Diagnosis not present

## 2022-01-14 DIAGNOSIS — H2513 Age-related nuclear cataract, bilateral: Secondary | ICD-10-CM | POA: Diagnosis not present

## 2022-01-16 DIAGNOSIS — M47817 Spondylosis without myelopathy or radiculopathy, lumbosacral region: Secondary | ICD-10-CM | POA: Diagnosis not present

## 2022-02-06 ENCOUNTER — Other Ambulatory Visit: Payer: Medicare HMO

## 2022-02-14 ENCOUNTER — Other Ambulatory Visit: Payer: Medicare HMO

## 2022-02-15 DIAGNOSIS — H2513 Age-related nuclear cataract, bilateral: Secondary | ICD-10-CM | POA: Diagnosis not present

## 2022-02-15 DIAGNOSIS — H35371 Puckering of macula, right eye: Secondary | ICD-10-CM | POA: Diagnosis not present

## 2022-02-15 DIAGNOSIS — H353130 Nonexudative age-related macular degeneration, bilateral, stage unspecified: Secondary | ICD-10-CM | POA: Diagnosis not present

## 2022-02-19 ENCOUNTER — Other Ambulatory Visit: Payer: Self-pay | Admitting: Internal Medicine

## 2022-02-19 DIAGNOSIS — M47817 Spondylosis without myelopathy or radiculopathy, lumbosacral region: Secondary | ICD-10-CM | POA: Diagnosis not present

## 2022-02-19 DIAGNOSIS — M47812 Spondylosis without myelopathy or radiculopathy, cervical region: Secondary | ICD-10-CM | POA: Diagnosis not present

## 2022-02-27 ENCOUNTER — Other Ambulatory Visit: Payer: Medicare HMO

## 2022-03-04 ENCOUNTER — Ambulatory Visit: Payer: Medicare HMO | Admitting: Gastroenterology

## 2022-03-06 ENCOUNTER — Telehealth: Payer: Self-pay | Admitting: Internal Medicine

## 2022-03-06 NOTE — Telephone Encounter (Signed)
Copied from [employer] [id number]. Topic: Medicare AWV >> Mar 06, 2022 10:05 AM Harris-Coley, Hannah Beat wrote: Reason for CRM: Left message for patient to schedule Annual Wellness Visit.  Please schedule with Nurse Health Advisor Denisa O'Brien-Blaney, LPN at [facility].  Please call [phone] ask for [facility]

## 2022-03-14 ENCOUNTER — Other Ambulatory Visit (INDEPENDENT_AMBULATORY_CARE_PROVIDER_SITE_OTHER): Payer: Medicare HMO

## 2022-03-14 DIAGNOSIS — E7800 Pure hypercholesterolemia, unspecified: Secondary | ICD-10-CM | POA: Diagnosis not present

## 2022-03-14 DIAGNOSIS — E034 Atrophy of thyroid (acquired): Secondary | ICD-10-CM | POA: Diagnosis not present

## 2022-03-14 LAB — COMPREHENSIVE METABOLIC PANEL
ALT: 20 U/L (ref 0–35)
AST: 21 U/L (ref 0–37)
Albumin: 4.1 g/dL (ref 3.5–5.2)
Alkaline Phosphatase: 73 U/L (ref 39–117)
BUN: 21 mg/dL (ref 6–23)
CO2: 27 mEq/L (ref 19–32)
Calcium: 9 mg/dL (ref 8.4–10.5)
Chloride: 106 mEq/L (ref 96–112)
Creatinine, Ser: 0.91 mg/dL (ref 0.40–1.20)
GFR: 59.6 mL/min — ABNORMAL LOW (ref >60.00)
Glucose, Bld: 83 mg/dL (ref 70–99)
Potassium: 4.3 mEq/L (ref 3.5–5.1)
Sodium: 139 mEq/L (ref 135–145)
Total Bilirubin: 0.5 mg/dL (ref 0.2–1.2)
Total Protein: 6.3 g/dL (ref 6.0–8.3)

## 2022-03-14 LAB — LIPID PANEL
Cholesterol: 201 mg/dL — ABNORMAL HIGH (ref 0–200)
HDL: 81.1 mg/dL (ref >39.00)
LDL Cholesterol: 98 mg/dL (ref 0–99)
NonHDL: 119.49
Total CHOL/HDL Ratio: 2
Triglycerides: 108 mg/dL (ref 0.0–149.0)
VLDL: 21.6 mg/dL (ref 0.0–40.0)

## 2022-03-14 LAB — TSH: TSH: 2.4 u[IU]/mL (ref 0.35–5.50)

## 2022-03-28 ENCOUNTER — Other Ambulatory Visit: Payer: Self-pay

## 2022-03-28 MED ORDER — CLOPIDOGREL BISULFATE 75 MG PO TABS: 75.0000 mg | ORAL_TABLET | Freq: Every day | ORAL | 2 refills | Status: DC

## 2022-04-09 ENCOUNTER — Ambulatory Visit: Payer: Medicare HMO | Admitting: Gastroenterology

## 2022-04-17 ENCOUNTER — Other Ambulatory Visit: Payer: Self-pay

## 2022-04-17 MED ORDER — CLOPIDOGREL BISULFATE 75 MG PO TABS: 75.0000 mg | ORAL_TABLET | Freq: Every day | ORAL | 1 refills | Status: DC

## 2022-04-22 ENCOUNTER — Ambulatory Visit (INDEPENDENT_AMBULATORY_CARE_PROVIDER_SITE_OTHER): Payer: Medicare HMO

## 2022-04-22 VITALS — Ht 63.0 in | Wt 150.0 lb

## 2022-04-22 DIAGNOSIS — Z0000 Encounter for general adult medical examination without abnormal findings: Secondary | ICD-10-CM

## 2022-04-22 NOTE — Patient Instructions (Addendum)
  Tammy Tate , Thank you for taking time to come for your Medicare Wellness Visit. I appreciate your ongoing commitment to your health goals. Please review the following plan we discussed and let me know if I can assist you in the future.   These are the goals we discussed:  Goals       Patient Stated     Weight (lb) < 150 lb (68 kg) (pt-stated)      7,000-10,000 steps daily Healthy diet Staying hydrated Weight goal 145lb        This is a list of the screening recommended for you and due dates:  Health Maintenance  Topic Date Due   COVID-19 Vaccine (5 - Booster for Pfizer series) 05/08/2022*   Flu Shot  04/30/2022   Tetanus Vaccine  05/19/2030   Pneumonia Vaccine  Completed   DEXA scan (bone density measurement)  Completed   Zoster (Shingles) Vaccine  Completed   HPV Vaccine  Aged Out  *Topic was postponed. The date shown is not the original due date.

## 2022-04-22 NOTE — Progress Notes (Addendum)
Subjective:   Tammy Tate is a 81 y.o. female who presents for Medicare Annual (Subsequent) preventive examination.  Review of Systems    No ROS.  Medicare Wellness Virtual Visit.  Visual/audio telehealth visit, UTA vital signs.   See social history for additional risk factors.   Cardiac Risk Factors include: advanced age (>41mn, >>42women)     Objective:    Today's Vitals   04/22/22 1130  Weight: 150 lb (68 kg)  Height: '5\' 3"'$  (1.6 m)   Body mass index is 26.57 kg/m.     04/22/2022   11:22 AM 12/04/2021    8:34 AM 03/06/2021   10:05 AM 06/10/2019    9:48 AM 09/08/2018    8:14 AM 09/02/2017    8:50 AM 06/17/2016    9:06 AM  Advanced Directives  Does Patient Have a Medical Advance Directive? Yes Yes Yes Yes Yes Yes Yes  Type of AParamedicof [place] will [place] will [place] will Living will [place]Living will Living will;Healthcare Power of [place]ake [place]Living will  Does patient want to make changes to medical advance directive? No - Patient declined  No - Patient declined  No - Patient declined No - Patient declined   Copy of [place] Chart? Yes - validated most recent copy scanned in chart (See row information) No - copy requested No - copy requested  Yes - validated most recent copy scanned in chart (See row information) No - copy requested No - copy requested    Current Medications (verified) Outpatient Encounter Medications as of 04/22/2022  Medication Sig   Biotin 5 MG TABS Take by mouth.   Calcium-Magnesium-Vitamin D (CALCIUM 1200+D3 PO) Take 1 tablet by mouth. Ca '1200mg'$   D3 232m   clopidogrel (PLAVIX) 75 MG tablet Take 1 tablet (75 mg total) by mouth daily.   levothyroxine (SYNTHROID) 88 MCG tablet TAKE ONE TABLET BY MOUTH EVERY MORNING AT LEAST 30-60 MINUTES BEFORE BREAKFAST ON AN EMPTY STOMACH WITH A GLASS  OF WATER   Multiple Vitamins-Minerals (PRESERVISION AREDS 2 PO)    Omega-3 Fatty Acids (OMEGA 3 PO) Take by mouth daily.   Turmeric 500 MG CAPS Take 2 capsules by mouth daily.    vitamin B-12 (CYANOCOBALAMIN) 1000 MCG tablet Take 3,000 mcg by mouth daily.   ALPRAZolam (XANAX) 0.25 MG tablet TAKE ONE TABLET BY MOUTH TWICE A DAY AS NEEDED FOR ANXIETYT   [DISCONTINUED] omeprazole (PRILOSEC) 20 MG capsule Take 1 capsule (20 mg total) by mouth 2 (two) times daily before a meal.   No facility-administered encounter medications on file as of 04/22/2022.    Allergies (verified) Atorvastatin   History: Past Medical History:  Diagnosis Date   Allergy    B12 deficiency    Bronchitis    hx of recurrent and bronchospasm   [place]   DCIS , right   Chicken pox    Degenerative arthritis of lumbar spine    Degenerative disk disease    [facility] 08-04-2007   GERD (gastroesophageal reflux disease)    Migraine with aura    Thyroid disease    Vasomotor rhinitis    Past Surgical History:  Procedure Laterality Date   ABDOMINAL HYSTERECTOMY     with prior HRT   APPENDECTOMY     COLONOSCOPY WITH PROPOFOL N/A 06/10/2019   Procedure: COLONOSCOPY WITH PROPOFOL;  Surgeon: AnJonathon BellowsMD;  [facility];  Service: Gastroenterology;  Laterality: N/A;   ESOPHAGOGASTRODUODENOSCOPY (EGD) WITH PROPOFOL N/A 06/10/2019   Procedure: ESOPHAGOGASTRODUODENOSCOPY (EGD) WITH PROPOFOL;  Surgeon: Jonathon Bellows, MD;  Location: [facility] ENDOSCOPY;  Service: Gastroenterology;  Laterality: N/A;   ESOPHAGOGASTRODUODENOSCOPY (EGD) WITH PROPOFOL N/A 12/04/2021   Procedure: ESOPHAGOGASTRODUODENOSCOPY (EGD) WITH PROPOFOL;  Surgeon: Jonathon Bellows, MD;  [facility];  Service: Gastroenterology;  Laterality: N/A;   MODIFIED RADICAL MASTECTOMY W/ AXILLARY LYMPH NODE DISSECTION W/ PECTORALIS MINOR PRESERVATION  2010   bilateral, for DCIS,  [name] , [facility]   Family History  Problem Relation Age of Onset   Cancer  Mother 15       esophgeal CA mets to lungs, spine    Stroke Father 63       tobacco abuse, hyperlipidemia   Hyperlipidemia Father    Heart disease Father    Diabetes Maternal Grandfather    Pancreatitis Daughter    Social History   Socioeconomic History   Marital status: Married    Spouse name: Not on file   Number of children: Not on file   Years of education: Not on file   Highest education level: Not on file  Occupational History   Not on file  Tobacco Use   Smoking status: Former    Types: Cigarettes    Quit date: 10/23/1971    Years since quitting: 50.5   Smokeless tobacco: Never  Vaping Use   Vaping Use: Never used  Substance and Sexual Activity   Alcohol use: Yes    Alcohol/week: 7.0 standard drinks of alcohol    Types: 7 Glasses of wine per week    Comment: occasional   Drug use: No   Sexual activity: Not Currently  Other Topics Concern   Not on file  Social History Narrative   Not on file   Social Determinants of Health   Financial Resource Strain: Low Risk  (04/22/2022)   Overall Financial Resource Strain (CARDIA)    Difficulty of Paying Living Expenses: Not hard at all  Food Insecurity: No Food Insecurity (04/22/2022)   Hunger Vital Sign    Worried About Running Out of Food in the Last Year: Never true    Ran Out of Food in the Last Year: Never true  Transportation Needs: No Transportation Needs (04/22/2022)   PRAPARE - Hydrologist (Medical): No    Lack of Transportation (Non-Medical): No  Physical Activity: Sufficiently Active (04/22/2022)   Exercise Vital Sign    Days of Exercise per Week: 5 days    Minutes of Exercise per Session: 30 min  Stress: No Stress Concern Present (04/22/2022)   [place]    Feeling of Stress : Not at all  Social Connections: Unknown (04/22/2022)   Social Connection and Isolation Panel [NHANES]    Frequency of Communication with  Friends and Family: Not on file    Frequency of Social Gatherings with Friends and Family: Not on file    Attends Religious Services: More than 4 times per year    Active Member of [employer] or Organizations: Not on file    Attends Archivist Meetings: Not on file    Marital Status: Married    Tobacco Counseling Counseling given: Not Answered   Clinical Intake:  Pre-visit preparation completed: Yes        Diabetes: No  How often do you need to have someone help you when you read instructions, pamphlets, or other  written materials from your doctor or pharmacy?: 1 - Never  Interpreter Needed?: No    Activities of Daily Living    04/22/2022   11:23 AM  In your present state of health, do you have any difficulty performing the following activities:  Hearing? 0  Vision? 0  Difficulty concentrating or making decisions? 0  Walking or climbing stairs? 0  Dressing or bathing? 0  Doing errands, shopping? 0  Preparing Food and eating ? N  Using the Toilet? N  In the past six months, have you accidently leaked urine? N  Do you have problems with loss of bowel control? N  Managing your Medications? N  Managing your Finances? N  Housekeeping or managing your Housekeeping? N    Patient Care Team: Crecencio Mc, MD as PCP - General (Internal Medicine)  Indicate any recent Medical Services you may have received from other than [facility] providers in the past year (date may be approximate).     Assessment:   This is a routine wellness examination for Tammy Tate.  Virtual Visit via Telephone Note  I connected with  Tammy Tate on 04/22/22 at 11:15 AM EDT by telephone and verified that I am speaking with the correct person using two identifiers.  Persons participating in the virtual visit: patient/Nurse Health Advisor   I discussed the limitations of performing an evaluation and management service by telehealth. We continued and completed visit with audio only. Some  vital signs may be absent or patient reported.   Hearing/Vision screen Hearing Screening - Comments:: Patient is able to hear conversational tones without difficulty.  No issues reported. Vision Screening - Comments:: [facility]  Wears corrective lenses  Macular degeneration; dry  They have seen their ophthalmologist in the last 4 months.   Dietary issues and exercise activities discussed: Current Exercise Habits: Home exercise routine, Type of exercise: walking, Time (Minutes): 30, Frequency (Times/Week): 5, Weekly Exercise (Minutes/Week): 150, Intensity: Mild    Goals Addressed               This Visit's Progress     Patient Stated     Weight (lb) < 150 lb (68 kg) (pt-stated)   150 lb (68 kg)     7,000-10,000 steps daily Healthy diet Staying hydrated Weight goal 145lb       Depression Screen    04/22/2022   11:27 AM 11/01/2021    8:41 AM 08/10/2021    3:59 PM 08/01/2021    3:08 PM 03/06/2021   10:01 AM 11/16/2020    9:48 AM 05/17/2020    9:11 AM  PHQ 2/9 Scores  PHQ - 2 Score 0 0 0 0 0 0 0  PHQ- 9 Score      0     Fall Risk    04/22/2022   11:26 AM 11/01/2021    8:41 AM 08/10/2021    3:59 PM 08/01/2021    3:08 PM 03/06/2021   10:06 AM  Fall Risk   Falls in the past year? 0 1 0 0 0  Number falls in past yr: 0 0   0  Injury with Fall?  0   0  Risk for fall due to :  History of fall(s) No Fall Risks No Fall Risks   Follow up Falls evaluation completed Falls evaluation completed Falls evaluation completed Falls evaluation completed Falls evaluation completed    [place]: Home free of loose throw rugs in walkways,  pet beds, electrical cords, etc? Yes  Adequate lighting in your home to reduce risk of falls? Yes   ASSISTIVE DEVICES UTILIZED TO PREVENT FALLS: Life alert? No  Use of a cane, walker or w/c? No  Grab bars in the bathroom? No  Shower stool in shower as needed? Yes  Comfort chair height toilet? Yes    TIMED UP AND GO: Was the test performed? No .   Cognitive Function: Patient is alert and oriented x3.  Enjoys playing [employer] and other brain stimulating activities.      09/08/2018    8:25 AM 06/17/2016    9:17 AM  MMSE - Mini Mental State Exam  Orientation to time 5 5  Orientation to Place 5 5  Registration 3 3  Attention/ Calculation 5 5  Recall 2 3  Language- name 2 objects 2 2  Language- repeat 1 1  Language- follow 3 step command 3 3  Language- read & follow direction 1 1  Write a sentence 1 1  Copy design 1 1  Total score 29 30        09/02/2017    8:57 AM  6CIT Screen  What Year? 0 points  What month? 0 points  What time? 0 points  Count back from 20 0 points  Months in reverse 0 points  Repeat phrase 0 points  Total Score 0 points    Immunizations Immunization History  Administered Date(s) Administered   Fluad Quad(high Dose 65+) 07/02/2019, 08/01/2021   Influenza Split 07/02/2013, 07/11/2014   Influenza, High Dose Seasonal PF 06/17/2016, 06/26/2017, 07/15/2018, 05/19/2020   Influenza-Unspecified 07/02/2013   PFIZER(Purple Top)SARS-COV-2 Vaccination 10/07/2019, 10/28/2019, 06/27/2020, 05/23/2021   Pneumococcal Conjugate-13 03/02/2014   Pneumococcal Polysaccharide-23 03/03/2007, 03/19/2018   Tdap 02/25/2009, 05/19/2020   Zoster Recombinat (Shingrix) 12/19/2017, 04/16/2018   Zoster, Live 03/27/2011   Screening Tests Health Maintenance  Topic Date Due   COVID-19 Vaccine (5 - Booster for Pfizer series) 05/08/2022 (Originally 07/18/2021)   INFLUENZA VACCINE  04/30/2022   TETANUS/TDAP  05/19/2030   Pneumonia Vaccine 26+ Years old  Completed   DEXA SCAN  Completed   Zoster Vaccines- Shingrix  Completed   HPV VACCINES  Aged Out   Health Maintenance There are no preventive care reminders to display for this patient.  Lung Cancer Screening: (Low Dose CT Chest recommended if Age 73-80 years, 30 pack-year currently smoking OR have quit w/in  15years.) does not qualify.   Vision Screening: Recommended annual ophthalmology exams for early detection of glaucoma and other disorders of the eye.  Dental Screening: Recommended annual dental exams for proper oral hygiene  Community Resource Referral / Chronic Care Management: CRR required this visit?  No   CCM required this visit?  No      Plan:   Keep all routine maintenance appointments.   I have personally reviewed and noted the following in the patient's chart:   Medical and social history Use of alcohol, tobacco or illicit drugs  Current medications and supplements including opioid prescriptions.  Functional ability and status Nutritional status Physical activity Advanced directives List of other physicians Hospitalizations, surgeries, and ER visits in previous 12 months Vitals Screenings to include cognitive, depression, and falls Referrals and appointments  In addition, I have reviewed and discussed with patient certain preventive protocols, quality metrics, and best practice recommendations. A written personalized care plan for preventive services as well as general preventive health recommendations were provided to patient.     OBrien-Blaney, Octavio Matheney L,  LPN   [id number]/72/[id number]     I have reviewed the above information and agree with above.   Deborra Medina, MD

## 2022-04-23 ENCOUNTER — Encounter: Payer: Self-pay | Admitting: Internal Medicine

## 2022-04-24 ENCOUNTER — Encounter: Payer: Self-pay | Admitting: Internal Medicine

## 2022-04-30 ENCOUNTER — Encounter: Payer: Self-pay | Admitting: Internal Medicine

## 2022-05-01 ENCOUNTER — Encounter: Payer: Self-pay | Admitting: Internal Medicine

## 2022-05-01 ENCOUNTER — Ambulatory Visit (INDEPENDENT_AMBULATORY_CARE_PROVIDER_SITE_OTHER): Payer: Medicare HMO | Admitting: Internal Medicine

## 2022-05-01 DIAGNOSIS — K222 Esophageal obstruction: Secondary | ICD-10-CM | POA: Diagnosis not present

## 2022-05-01 DIAGNOSIS — Z8673 Personal history of transient ischemic attack (TIA), and cerebral infarction without residual deficits: Secondary | ICD-10-CM | POA: Diagnosis not present

## 2022-05-01 DIAGNOSIS — E7800 Pure hypercholesterolemia, unspecified: Secondary | ICD-10-CM

## 2022-05-01 DIAGNOSIS — K2100 Gastro-esophageal reflux disease with esophagitis, without bleeding: Secondary | ICD-10-CM | POA: Diagnosis not present

## 2022-05-01 DIAGNOSIS — E034 Atrophy of thyroid (acquired): Secondary | ICD-10-CM

## 2022-05-01 NOTE — Patient Instructions (Addendum)
You should be using omeprazole daily given your esophageal issues :  2 times daily on an empty stomach   You can take the morning dose with your thyroid medication   We will repeat your thyroid test any time after 6 weeks

## 2022-05-01 NOTE — Assessment & Plan Note (Signed)
Lipid panel is excellent without statin therapy  Lab Results  Component Value Date   CHOL 201 (H) 03/14/2022   HDL 81.10 03/14/2022   LDLCALC 98 03/14/2022   LDLDIRECT 127.3 07/05/2013   TRIG 108.0 03/14/2022   CHOLHDL 2 03/14/2022

## 2022-05-01 NOTE — Assessment & Plan Note (Signed)
Continue asa and plavix,  She is stain intolerant due to severe myalgias

## 2022-05-01 NOTE — Assessment & Plan Note (Signed)
Thyroid function is WNL on current dose of  Levothyroxine 88 mcg daily .  No current changes needed.   Lab Results  Component Value Date   TSH 2.40 03/14/2022

## 2022-05-01 NOTE — Progress Notes (Signed)
Subjective:  Patient ID: Tammy Tate, female    DOB: January 07, 1941  Age: 81 y.o. MRN: [id number]  CC: Diagnoses of Pure hypercholesterolemia, Hypothyroidism due to acquired atrophy of thyroid, Lower esophageal ring (Schatzki), Gastroesophageal reflux disease with esophagitis without hemorrhage, and History of TIA (transient ischemic attack) were pertinent to this visit.   HPI Tammy Tate presents for  Chief Complaint  Patient presents with   Follow-up    6 month follow up on hypothyroidism    1) hypothyroid:  feeling good on levothyroxine .  Weight is stable an energy   2) insomnia:  early wakeups common.  Using alprazolam very sparingly .Denies nocturia and incontinence.  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 Avoids prolonged stooping/bending  4) back pain: has had several lumbar ablations by [facility] with several months of relief .  Last one Was April 19.   (No records available showing what they have done)  she  Uses aleve sparingly , tylenol does not help.    5) cervical disk disease:  MRI done by [facility] (see note) Outpatient Medications Prior to Visit  Medication Sig Dispense Refill   ALPRAZolam (XANAX) 0.25 MG tablet TAKE ONE TABLET BY MOUTH TWICE A DAY AS NEEDED FOR ANXIETYT 20 tablet 0   Biotin 5 MG TABS Take by mouth.     Calcium-Magnesium-Vitamin D (CALCIUM 1200+D3 PO) Take 1 tablet by mouth. Ca '1200mg'$   D3 80mg     clopidogrel (PLAVIX) 75 MG tablet Take 1 tablet (75 mg total) by mouth daily. 90 tablet 1   levothyroxine (SYNTHROID) 88 MCG tablet TAKE ONE TABLET BY MOUTH EVERY MORNING AT LEAST 30-60 MINUTES BEFORE BREAKFAST ON AN EMPTY STOMACH WITH A GLASS OF WATER 90 tablet 0   Multiple Vitamins-Minerals (PRESERVISION AREDS 2 PO)      Omega-3 Fatty Acids (OMEGA 3 PO) Take by mouth daily.     Turmeric 500 MG CAPS Take 2 capsules by mouth daily.      vitamin B-12 (CYANOCOBALAMIN) 1000 MCG tablet  Take 3,000 mcg by mouth daily.     No facility-administered medications prior to visit.    Review of Systems;  Patient denies headache, fevers, malaise, unintentional weight loss, skin rash, eye pain, sinus congestion and sinus pain, sore throat, dysphagia,  hemoptysis , cough, dyspnea, wheezing, chest pain, palpitations, orthopnea, edema, abdominal pain, nausea, melena, diarrhea, constipation, flank pain, dysuria, hematuria, urinary  Frequency, nocturia, numbness, tingling, seizures,  Focal weakness, Loss of consciousness,  Tremor, insomnia, depression, anxiety, and suicidal ideation.      Objective:  BP 122/68 (BP Location: Left Arm, Patient Position: Sitting, Cuff Size: Normal)   Pulse 77   Temp 97.9 F (36.6 C) (Oral)   Resp 14   Ht '5\' 3"'$  (1.6 m)   Wt 150 lb 3.2 oz (68.1 kg)   SpO2 98%   BMI 26.61 kg/m   BP Readings from Last 3 Encounters:  05/01/22 122/68  12/04/21 115/76  11/29/21 135/76    Wt Readings from Last 3 Encounters:  05/01/22 150 lb 3.2 oz (68.1 kg)  04/22/22 150 lb (68 kg)  12/04/21 150 lb (68 kg)    General appearance: alert, cooperative and appears stated age Ears: normal TM's and external ear canals both ears Throat: lips, mucosa, and tongue normal; teeth and gums normal Neck: no adenopathy, no carotid bruit, supple, symmetrical, trachea midline and thyroid not enlarged, symmetric, no  tenderness/mass/nodules Back: symmetric, no curvature. ROM normal. No CVA tenderness. Lungs: clear to auscultation bilaterally Heart: regular rate and rhythm, S1, S2 normal, no murmur, click, rub or gallop Abdomen: soft, non-tender; bowel sounds normal; no masses,  no organomegaly Pulses: 2+ and symmetric Skin: Skin color, texture, turgor normal. No rashes or lesions Lymph nodes: Cervical, supraclavicular, and axillary nodes normal.  Lab Results  Component Value Date   HGBA1C 5.1 11/16/2020   HGBA1C 5.1 06/17/2016    Lab Results  Component Value Date    CREATININE 0.91 03/14/2022   CREATININE 0.88 08/29/2021   CREATININE 1.02 08/01/2021    Lab Results  Component Value Date   WBC 6.2 08/01/2021   HGB 12.8 08/01/2021   HCT 38.8 08/01/2021   PLT 306.0 08/01/2021   GLUCOSE 83 03/14/2022   CHOL 201 (H) 03/14/2022   TRIG 108.0 03/14/2022   HDL 81.10 03/14/2022   LDLDIRECT 127.3 07/05/2013   LDLCALC 98 03/14/2022   ALT 20 03/14/2022   AST 21 03/14/2022   NA 139 03/14/2022   K 4.3 03/14/2022   CL 106 03/14/2022   CREATININE 0.91 03/14/2022   BUN 21 03/14/2022   CO2 27 03/14/2022   TSH 2.40 03/14/2022   HGBA1C 5.1 11/16/2020   MICROALBUR 1.3 06/17/2016    No results found.  Assessment & Plan:   Problem List Items Addressed This Visit     Acid reflux    With resultant Schatzki's ring by March 2023 EGD>  Resume omeprazole 20 mg bid       History of TIA (transient ischemic attack)    Continue asa and plavix,  She is stain intolerant due to severe myalgias      HLD (hyperlipidemia)    Lipid panel is excellent without statin therapy  Lab Results  Component Value Date   CHOL 201 (H) 03/14/2022   HDL 81.10 03/14/2022   LDLCALC 98 03/14/2022   LDLDIRECT 127.3 07/05/2013   TRIG 108.0 03/14/2022   CHOLHDL 2 03/14/2022         Hypothyroidism    Thyroid function is WNL on current dose of  Levothyroxine 88 mcg daily .  No current changes needed.   Lab Results  Component Value Date   TSH 2.40 03/14/2022         Relevant Orders   TSH   Lower esophageal ring (Schatzki)    Noted and dilated on march 2023 EGD.  Has not been taking omeprazole as directed by GI.  Reviewed  The reasons for taking it .  She will resume it bid and have TSH rechecked after 6 weeks        I spent a total of  30 minutes with this patient in a face to face visit on the date of this encounter reviewing the last office visit with me ,  her  most recent  orthopedics procedure,  her March 2023 EGD ,  patient'ss diet and eating habits,  and post  visit ordering of testing and therapeutics.    Follow-up: No follow-ups on file.   Crecencio Mc, MD

## 2022-05-01 NOTE — Assessment & Plan Note (Signed)
With resultant Schatzki's ring by March 2023 EGD>  Resume omeprazole 20 mg bid

## 2022-05-01 NOTE — Assessment & Plan Note (Signed)
Noted and dilated on march 2023 EGD.  Has not been taking omeprazole as directed by GI.  Reviewed  The reasons for taking it .  She will resume it bid and have TSH rechecked after 6 weeks

## 2022-05-15 DIAGNOSIS — H353131 Nonexudative age-related macular degeneration, bilateral, early dry stage: Secondary | ICD-10-CM | POA: Diagnosis not present

## 2022-05-18 ENCOUNTER — Other Ambulatory Visit: Payer: Self-pay | Admitting: Internal Medicine

## 2022-05-27 ENCOUNTER — Other Ambulatory Visit: Payer: Self-pay | Admitting: Gastroenterology

## 2022-05-27 ENCOUNTER — Other Ambulatory Visit (INDEPENDENT_AMBULATORY_CARE_PROVIDER_SITE_OTHER): Payer: Medicare HMO

## 2022-05-27 DIAGNOSIS — E034 Atrophy of thyroid (acquired): Secondary | ICD-10-CM

## 2022-05-27 LAB — TSH: TSH: 0.93 u[IU]/mL (ref 0.35–5.50)

## 2022-05-28 ENCOUNTER — Encounter: Payer: Self-pay | Admitting: Internal Medicine

## 2022-05-29 ENCOUNTER — Ambulatory Visit: Payer: Medicare HMO | Admitting: Podiatry

## 2022-05-29 MED ORDER — OMEPRAZOLE 20 MG PO CPDR: 20.0000 mg | DELAYED_RELEASE_CAPSULE | Freq: Two times a day (BID) | ORAL | 1 refills | Status: DC

## 2022-05-30 ENCOUNTER — Ambulatory Visit: Payer: Medicare HMO | Admitting: Podiatry

## 2022-05-30 DIAGNOSIS — M79674 Pain in right toe(s): Secondary | ICD-10-CM | POA: Diagnosis not present

## 2022-05-30 DIAGNOSIS — B351 Tinea unguium: Secondary | ICD-10-CM | POA: Diagnosis not present

## 2022-05-30 DIAGNOSIS — M79675 Pain in left toe(s): Secondary | ICD-10-CM

## 2022-06-02 ENCOUNTER — Encounter: Payer: Self-pay | Admitting: Internal Medicine

## 2022-06-04 ENCOUNTER — Ambulatory Visit: Payer: Medicare HMO | Admitting: Podiatry

## 2022-06-04 DIAGNOSIS — M47812 Spondylosis without myelopathy or radiculopathy, cervical region: Secondary | ICD-10-CM | POA: Diagnosis not present

## 2022-06-04 DIAGNOSIS — M791 Myalgia, unspecified site: Secondary | ICD-10-CM | POA: Diagnosis not present

## 2022-06-04 DIAGNOSIS — M5136 Other intervertebral disc degeneration, lumbar region: Secondary | ICD-10-CM | POA: Diagnosis not present

## 2022-06-04 DIAGNOSIS — M47817 Spondylosis without myelopathy or radiculopathy, lumbosacral region: Secondary | ICD-10-CM | POA: Diagnosis not present

## 2022-06-04 NOTE — Progress Notes (Signed)
  Subjective:  Patient ID: Tammy Tate, female    DOB: 03/03/1941,  MRN: [id number]  Chief Complaint  Patient presents with   Ingrown Toenail   81 y.o. female returns for the above complaint.  Patient presents with thickened elongated dystrophic toenails x10 mild pain on palpation.  Patient would like to have the debrided down.  She denies any other acute complaints.  She is not able to do it herself  Objective:  There were no vitals filed for this visit. Podiatric Exam: Vascular: dorsalis pedis and posterior tibial pulses are palpable bilateral. Capillary return is immediate. Temperature gradient is WNL. Skin turgor WNL  Sensorium: Normal Semmes Weinstein monofilament test. Normal tactile sensation bilaterally. Nail Exam: Pt has thick disfigured discolored nails with subungual debris noted bilateral entire nail hallux through fifth toenails.  Pain on palpation to the nails. Ulcer Exam: There is no evidence of ulcer or pre-ulcerative changes or infection. Orthopedic Exam: Muscle tone and strength are WNL. No limitations in general ROM. No crepitus or effusions noted.  Skin: No Porokeratosis. No infection or ulcers    Assessment & Plan:   1. Pain due to onychomycosis of toenails of both feet     Patient was evaluated and treated and all questions answered.  Onychomycosis with pain  -Nails palliatively debrided as below. -Educated on self-care  Procedure: Nail Debridement Rationale: pain  Type of Debridement: manual, sharp debridement. Instrumentation: Nail nipper, rotary burr. Number of Nails: 10  Procedures and Treatment: Consent by patient was obtained for treatment procedures. The patient understood the discussion of treatment and procedures well. All questions were answered thoroughly reviewed. Debridement of mycotic and hypertrophic toenails, 1 through 5 bilateral and clearing of subungual debris. No ulceration, no infection noted.  Return Visit-Office Procedure:  Patient instructed to return to the office for a follow up visit 3 months for continued evaluation and treatment.  Boneta Lucks, DPM    No follow-ups on file.

## 2022-06-23 ENCOUNTER — Other Ambulatory Visit: Payer: Self-pay | Admitting: Internal Medicine

## 2022-06-23 MED ORDER — MOLNUPIRAVIR EUA 200MG CAPSULE: 4.0000 | ORAL_CAPSULE | Freq: Two times a day (BID) | ORAL | 0 refills | Status: AC

## 2022-06-26 ENCOUNTER — Encounter: Payer: Self-pay | Admitting: Internal Medicine

## 2022-06-26 ENCOUNTER — Other Ambulatory Visit: Payer: Self-pay | Admitting: Internal Medicine

## 2022-06-26 MED ORDER — ALPRAZOLAM 0.25 MG PO TABS: 0.2500 mg | ORAL_TABLET | Freq: Every evening | ORAL | 2 refills | Status: DC | PRN

## 2022-06-26 NOTE — Telephone Encounter (Signed)
Duplicate request

## 2022-06-26 NOTE — Telephone Encounter (Signed)
Refilled: 05/28/2021 Last OV: 05/01/2022 Next OV: 11/04/2022

## 2022-07-25 DIAGNOSIS — R0602 Shortness of breath: Secondary | ICD-10-CM | POA: Diagnosis not present

## 2022-07-29 ENCOUNTER — Encounter (INDEPENDENT_AMBULATORY_CARE_PROVIDER_SITE_OTHER): Payer: Self-pay

## 2022-07-30 ENCOUNTER — Encounter: Payer: Self-pay | Admitting: Internal Medicine

## 2022-08-26 DIAGNOSIS — M47817 Spondylosis without myelopathy or radiculopathy, lumbosacral region: Secondary | ICD-10-CM | POA: Diagnosis not present

## 2022-08-27 ENCOUNTER — Other Ambulatory Visit: Payer: Self-pay | Admitting: Internal Medicine

## 2022-08-28 MED ORDER — LEVOTHYROXINE SODIUM 88 MCG PO TABS: ORAL_TABLET | ORAL | 0 refills | Status: DC

## 2022-09-11 DIAGNOSIS — D485 Neoplasm of uncertain behavior of skin: Secondary | ICD-10-CM | POA: Diagnosis not present

## 2022-09-11 DIAGNOSIS — L814 Other melanin hyperpigmentation: Secondary | ICD-10-CM | POA: Diagnosis not present

## 2022-09-11 DIAGNOSIS — D225 Melanocytic nevi of trunk: Secondary | ICD-10-CM | POA: Diagnosis not present

## 2022-09-11 DIAGNOSIS — D2262 Melanocytic nevi of left upper limb, including shoulder: Secondary | ICD-10-CM | POA: Diagnosis not present

## 2022-09-11 DIAGNOSIS — L821 Other seborrheic keratosis: Secondary | ICD-10-CM | POA: Diagnosis not present

## 2022-09-11 DIAGNOSIS — L570 Actinic keratosis: Secondary | ICD-10-CM | POA: Diagnosis not present

## 2022-09-11 DIAGNOSIS — Z85828 Personal history of other malignant neoplasm of skin: Secondary | ICD-10-CM | POA: Diagnosis not present

## 2022-09-11 DIAGNOSIS — L820 Inflamed seborrheic keratosis: Secondary | ICD-10-CM | POA: Diagnosis not present

## 2022-09-11 DIAGNOSIS — D2261 Melanocytic nevi of right upper limb, including shoulder: Secondary | ICD-10-CM | POA: Diagnosis not present

## 2022-10-01 DIAGNOSIS — M47817 Spondylosis without myelopathy or radiculopathy, lumbosacral region: Secondary | ICD-10-CM | POA: Diagnosis not present

## 2022-10-01 DIAGNOSIS — M47812 Spondylosis without myelopathy or radiculopathy, cervical region: Secondary | ICD-10-CM | POA: Diagnosis not present

## 2022-10-01 DIAGNOSIS — M5136 Other intervertebral disc degeneration, lumbar region: Secondary | ICD-10-CM | POA: Diagnosis not present

## 2022-11-04 ENCOUNTER — Ambulatory Visit (INDEPENDENT_AMBULATORY_CARE_PROVIDER_SITE_OTHER): Payer: Medicare HMO | Admitting: Internal Medicine

## 2022-11-04 ENCOUNTER — Encounter: Payer: Self-pay | Admitting: Internal Medicine

## 2022-11-04 VITALS — BP 112/72 | HR 55 | Temp 97.5°F | Ht 63.0 in | Wt 147.8 lb

## 2022-11-04 DIAGNOSIS — Z8673 Personal history of transient ischemic attack (TIA), and cerebral infarction without residual deficits: Secondary | ICD-10-CM

## 2022-11-04 DIAGNOSIS — E559 Vitamin D deficiency, unspecified: Secondary | ICD-10-CM

## 2022-11-04 DIAGNOSIS — K2100 Gastro-esophageal reflux disease with esophagitis, without bleeding: Secondary | ICD-10-CM

## 2022-11-04 DIAGNOSIS — E7800 Pure hypercholesterolemia, unspecified: Secondary | ICD-10-CM | POA: Diagnosis not present

## 2022-11-04 DIAGNOSIS — F5104 Psychophysiologic insomnia: Secondary | ICD-10-CM | POA: Diagnosis not present

## 2022-11-04 DIAGNOSIS — M85852 Other specified disorders of bone density and structure, left thigh: Secondary | ICD-10-CM | POA: Diagnosis not present

## 2022-11-04 DIAGNOSIS — H353130 Nonexudative age-related macular degeneration, bilateral, stage unspecified: Secondary | ICD-10-CM

## 2022-11-04 DIAGNOSIS — M48061 Spinal stenosis, lumbar region without neurogenic claudication: Secondary | ICD-10-CM | POA: Diagnosis not present

## 2022-11-04 DIAGNOSIS — Z8781 Personal history of (healed) traumatic fracture: Secondary | ICD-10-CM

## 2022-11-04 DIAGNOSIS — E663 Overweight: Secondary | ICD-10-CM

## 2022-11-04 DIAGNOSIS — E034 Atrophy of thyroid (acquired): Secondary | ICD-10-CM

## 2022-11-04 DIAGNOSIS — E538 Deficiency of other specified B group vitamins: Secondary | ICD-10-CM

## 2022-11-04 DIAGNOSIS — D500 Iron deficiency anemia secondary to blood loss (chronic): Secondary | ICD-10-CM

## 2022-11-04 DIAGNOSIS — Z0000 Encounter for general adult medical examination without abnormal findings: Secondary | ICD-10-CM | POA: Diagnosis not present

## 2022-11-04 DIAGNOSIS — D7589 Other specified diseases of blood and blood-forming organs: Secondary | ICD-10-CM | POA: Diagnosis not present

## 2022-11-04 DIAGNOSIS — H353190 Nonexudative age-related macular degeneration, unspecified eye, stage unspecified: Secondary | ICD-10-CM | POA: Insufficient documentation

## 2022-11-04 DIAGNOSIS — R5383 Other fatigue: Secondary | ICD-10-CM | POA: Insufficient documentation

## 2022-11-04 LAB — COMPREHENSIVE METABOLIC PANEL
ALT: 15 U/L (ref 0–35)
AST: 17 U/L (ref 0–37)
Albumin: 4.2 g/dL (ref 3.5–5.2)
Alkaline Phosphatase: 80 U/L (ref 39–117)
BUN: 18 mg/dL (ref 6–23)
CO2: 27 mEq/L (ref 19–32)
Calcium: 9 mg/dL (ref 8.4–10.5)
Chloride: 102 mEq/L (ref 96–112)
Creatinine, Ser: 0.93 mg/dL (ref 0.40–1.20)
GFR: 57.81 mL/min — ABNORMAL LOW (ref >60.00)
Glucose, Bld: 85 mg/dL (ref 70–99)
Potassium: 4.4 mEq/L (ref 3.5–5.1)
Sodium: 138 mEq/L (ref 135–145)
Total Bilirubin: 0.6 mg/dL (ref 0.2–1.2)
Total Protein: 6.5 g/dL (ref 6.0–8.3)

## 2022-11-04 LAB — CBC WITH DIFFERENTIAL/PLATELET
Basophils Absolute: 0 10*3/uL (ref 0.0–0.1)
Basophils Relative: 0.8 % (ref 0.0–3.0)
Eosinophils Absolute: 0.2 10*3/uL (ref 0.0–0.7)
Eosinophils Relative: 4 % (ref 0.0–5.0)
HCT: 36.5 % (ref 36.0–46.0)
Hemoglobin: 12.2 g/dL (ref 12.0–15.0)
Lymphocytes Relative: 15.3 % (ref 12.0–46.0)
Lymphs Abs: 0.9 10*3/uL (ref 0.7–4.0)
MCHC: 33.4 g/dL (ref 30.0–36.0)
MCV: 90.6 fl (ref 78.0–100.0)
Monocytes Absolute: 0.8 10*3/uL (ref 0.1–1.0)
Monocytes Relative: 14.3 % — ABNORMAL HIGH (ref 3.0–12.0)
Neutro Abs: 3.7 10*3/uL (ref 1.4–7.7)
Neutrophils Relative %: 65.6 % (ref 43.0–77.0)
Platelets: 327 10*3/uL (ref 150.0–400.0)
RBC: 4.02 Mil/uL (ref 3.87–5.11)
RDW: 15.8 % — ABNORMAL HIGH (ref 11.5–15.5)
WBC: 5.6 10*3/uL (ref 4.0–10.5)

## 2022-11-04 LAB — VITAMIN D 25 HYDROXY (VIT D DEFICIENCY, FRACTURES): VITD: 25.57 ng/mL — ABNORMAL LOW (ref 30.00–100.00)

## 2022-11-04 LAB — LIPID PANEL
Cholesterol: 226 mg/dL — ABNORMAL HIGH (ref 0–200)
HDL: 95 mg/dL (ref >39.00)
LDL Cholesterol: 116 mg/dL — ABNORMAL HIGH (ref 0–99)
NonHDL: 130.93
Total CHOL/HDL Ratio: 2
Triglycerides: 77 mg/dL (ref 0.0–149.0)
VLDL: 15.4 mg/dL (ref 0.0–40.0)

## 2022-11-04 LAB — VITAMIN B12: Vitamin B-12: 459 pg/mL (ref 211–911)

## 2022-11-04 LAB — TSH: TSH: 1.81 u[IU]/mL (ref 0.35–5.50)

## 2022-11-04 LAB — LDL CHOLESTEROL, DIRECT: Direct LDL: 115 mg/dL

## 2022-11-04 MED ORDER — OMEPRAZOLE 20 MG PO CPDR: 20.0000 mg | DELAYED_RELEASE_CAPSULE | Freq: Two times a day (BID) | ORAL | 1 refills | Status: DC

## 2022-11-04 MED ORDER — CLOPIDOGREL BISULFATE 75 MG PO TABS: 75.0000 mg | ORAL_TABLET | Freq: Every day | ORAL | 1 refills | Status: DC

## 2022-11-04 NOTE — Assessment & Plan Note (Signed)
Intentionally losing weight

## 2022-11-04 NOTE — Assessment & Plan Note (Addendum)
Lipid panel has been historically excellent without statin therapy and she has a history of statin myalgias   Lab Results  Component Value Date   CHOL 226 (H) 11/04/2022   HDL 95.00 11/04/2022   LDLCALC 116 (H) 11/04/2022   LDLDIRECT 115.0 11/04/2022   TRIG 77.0 11/04/2022   CHOLHDL 2 11/04/2022

## 2022-11-04 NOTE — Assessment & Plan Note (Addendum)
30  minutes was spent with patient more than half of which was spent in counseling patient on management of her frustration surrounding her husband's physical decline and withdrawal

## 2022-11-04 NOTE — Patient Instructions (Addendum)
I agree with stopping the plavix,  Consider increasing your aspirin to a full strength.   You might want to try using Relaxium for insomnia  (as seen on TV commercials) . It is available through [employer] and contains all natural supplements:  Melatonin 5 mg  Chamomile 25 mg Passionflower extract 75 mg GABA 100 mg Ashwaganda extract 125 mg Magnesium citrate, glycinate, oxide (100 mg)  L tryptophan 500 mg Valerest (proprietary  ingredient ; probably valeria root extract)

## 2022-11-04 NOTE — Assessment & Plan Note (Signed)
Alprazolam was not helpful.  Advised to try relaxium

## 2022-11-04 NOTE — Assessment & Plan Note (Signed)
S/p 2nd ablation nov 2023  with diminishing returns .  Nexgt one in May.  Staying active.

## 2022-11-04 NOTE — Assessment & Plan Note (Signed)
With resultant Schatzki's ring by March 2023 EGD.  Continue omeprazole 20 mg bid

## 2022-11-04 NOTE — Assessment & Plan Note (Signed)
DEXA reviewed 2021.  Osteopenia of hip.  Repeat in 2025

## 2022-11-04 NOTE — Progress Notes (Signed)
Patient ID: Tammy Tate, female    DOB: [birth year]  Age: 82 y.o. MRN: [id number]  The patient is here for annual preventive examination and management of other chronic and acute problems.   The risk factors are reflected in the social history.  The roster of all physicians providing medical care to patient - is listed in the Snapshot section of the chart.  Activities of daily living:  The patient is 100% independent in all ADLs: dressing, toileting, feeding as well as independent mobility  Home safety : The patient has smoke detectors in the home. They wear seatbelts.  There are no firearms at home. There is no violence in the home.   There is no risks for hepatitis, STDs or HIV. There is no   history of blood transfusion. They have no travel history to infectious disease endemic areas of the world.  The patient has seen their dentist in the last six month. They have seen their eye doctor in the last year. They admit to slight hearing difficulty with regard to whispered voices and some television programs.  They have deferred audiologic testing in the last year.  They do not  have excessive sun exposure. Discussed the need for sun protection: hats, long sleeves and use of sunscreen if there is significant sun exposure.   Diet: the importance of a healthy diet is discussed. They do have a healthy diet.  The benefits of regular aerobic exercise were discussed. She walks 5 times per week ,  45 minutes.   Depression screen: there are no signs or vegative symptoms of depression- irritability, change in appetite, anhedonia, sadness/tearfullness.  Cognitive assessment: the patient manages all their financial and personal affairs and is actively engaged. They could relate day,date,year and events; recalled 2/3 objects at 3 minutes; performed clock-face test normally.  The following portions of the patient's history were reviewed and updated as appropriate: allergies, current medications, past  family history, past medical history,  past surgical history, past social history  and problem list.  Visual acuity was not assessed per patient preference since she has regular follow up with her ophthalmologist. Hearing and body mass index were assessed and reviewed.   During the course of the visit the patient was educated and counseled about appropriate screening and preventive services including : fall prevention , diabetes screening, nutrition counseling, colorectal cancer screening, and recommended immunizations.    CC: The primary encounter diagnosis was Visit for preventive health examination. Diagnoses of Hypothyroidism due to acquired atrophy of thyroid, Pure hypercholesterolemia, Iron deficiency anemia due to chronic blood loss, Bilateral nonexudative age-related macular degeneration, unspecified stage, Macrocytosis without anemia, B12 deficiency, Gastroesophageal reflux disease with esophagitis without hemorrhage, History of TIA (transient ischemic attack), Vitamin D deficiency, Osteopenia of neck of left femur, Overweight (BMI 25.0-29.9), History of vertebral fracture, Spinal stenosis of lumbar region without neurogenic claudication, Chronic insomnia, and Caregiver with fatigue were also pertinent to this visit.  1) Caregiver fatigue:  husband has become less Risk analyst,  preferring to stay home 24/7 due to multiple issues both orthopedic and pulmonary "I'm not ready to stop doing things" She has made several [place] trips without him due to his refusal to  travel ,   History Cadi has a past medical history of Allergy, B12 deficiency, Bronchitis, Cancer ([place]) (2010), Chicken pox, Degenerative arthritis of lumbar spine, Degenerative disk disease, GERD (gastroesophageal reflux disease), Migraine with aura, Thyroid disease, and Vasomotor rhinitis.   She has a past surgical history that includes  Appendectomy; Modified radical mastectomy w/ axillary lymph node dissection w/ pectoral  minor preservation (2010); Abdominal hysterectomy; Colonoscopy with propofol (N/A, 06/10/2019); Esophagogastroduodenoscopy (egd) with propofol (N/A, 06/10/2019); and Esophagogastroduodenoscopy (egd) with propofol (N/A, 12/04/2021).   Her family history includes Cancer (age of onset: 16) in her mother; Diabetes in her maternal grandfather; Heart disease in her father; Hyperlipidemia in her father; Pancreatitis in her daughter; Stroke (age of onset: 39) in her father.She reports that she quit smoking about 51 years ago. Her smoking use included cigarettes. She has never used smokeless tobacco. She reports current alcohol use of about 7.0 standard drinks of alcohol per week. She reports that she does not use drugs.  Outpatient Medications Prior to Visit  Medication Sig Dispense Refill   ALPRAZolam (XANAX) 0.25 MG tablet Take 1 tablet (0.25 mg total) by mouth at bedtime as needed for anxiety. 20 tablet 2   Biotin 5 MG TABS Take by mouth.     Calcium-Magnesium-Vitamin D (CALCIUM 1200+D3 PO) Take 1 tablet by mouth. Ca '1200mg'$   D3 4mg     levothyroxine (SYNTHROID) 88 MCG tablet TAKE ONE TABLET BY MOUTH EVERY MORNING AT LEAST 30-60 MINUTES BEFORE BREAKFAST ON AN EMPTY STOMACH. TAKE WITH A GLASS OF WATER. 90 tablet 0   Multiple Vitamins-Minerals (PRESERVISION AREDS 2 PO)      Omega-3 Fatty Acids (OMEGA 3 PO) Take by mouth daily.     Turmeric 500 MG CAPS Take 2 capsules by mouth daily.      vitamin B-12 (CYANOCOBALAMIN) 1000 MCG tablet Take 3,000 mcg by mouth daily.     clopidogrel (PLAVIX) 75 MG tablet Take 1 tablet (75 mg total) by mouth daily. 90 tablet 1   omeprazole (PRILOSEC) 20 MG capsule Take 1 capsule (20 mg total) by mouth 2 (two) times daily before a meal. 180 capsule 1   No facility-administered medications prior to visit.    Review of Systems  Patient denies headache, fevers, malaise, unintentional weight loss, skin rash, eye pain, sinus congestion and sinus pain, sore throat, dysphagia,   hemoptysis , cough, dyspnea, wheezing, chest pain, palpitations, orthopnea, edema, abdominal pain, nausea, melena, diarrhea, constipation, flank pain, dysuria, hematuria, urinary  Frequency, nocturia, numbness, tingling, seizures,  Focal weakness, Loss of consciousness,  Tremor, insomnia, depression, anxiety, and suicidal ideation.    Objective:  BP 112/72   Pulse (!) 55   Temp (!) 97.5 F (36.4 C) (Oral)   Ht '5\' 3"'$  (1.6 m)   Wt 147 lb 12.8 oz (67 kg)   SpO2 96%   BMI 26.18 kg/m   Physical Exam:  General appearance: alert, cooperative and appears stated age Head: Normocephalic, without obvious abnormality, atraumatic Eyes: conjunctivae/corneas clear. PERRL, EOM's intact. Fundi benign. Ears: normal TM's and external ear canals both ears Nose: Nares normal. Septum midline. Mucosa normal. No drainage or sinus tenderness. Throat: lips, mucosa, and tongue normal; teeth and gums normal Neck: no adenopathy, no carotid bruit, no JVD, supple, symmetrical, trachea midline and thyroid not enlarged, symmetric, no tenderness/mass/nodules Lungs: clear to auscultation bilaterally Heart: regular rate and rhythm, S1, S2 normal, no murmur, click, rub or gallop Abdomen: soft, non-tender; bowel sounds normal; no masses,  no organomegaly Extremities: extremities normal, atraumatic, no cyanosis or edema Pulses: 2+ and symmetric Skin: Skin color, texture, turgor normal. No rashes or lesions Neurologic: Alert and oriented X 3, normal strength and tone. Normal symmetric reflexes. Normal coordination and gait.     Assessment & Plan:  Visit for preventive health examination Assessment &  Plan: age appropriate education and counseling updated, referrals for preventative services and immunizations addressed, dietary and smoking counseling addressed, most recent labs reviewed.  I have personally reviewed and have noted:   1) the patient's medical and social history 2) The pt's use of alcohol, tobacco, and  illicit drugs 3) The patient's current medications and supplements 4) Functional ability including ADL's, fall risk, home safety risk, hearing and visual impairment 5) Diet and physical activities 6) Evidence for depression or mood disorder 7) The patient's height, weight, and BMI have been recorded in the chartI have made referrals, and provided counseling and education based on review of the above    Hypothyroidism due to acquired atrophy of thyroid Assessment & Plan: Thyroid function is normal continue.    Levothyroxine 88 mcg daily .     Lab Results  Component Value Date   TSH 1.81 11/04/2022  n    Orders: -     TSH  Pure hypercholesterolemia Assessment & Plan: Lipid panel has been historically excellent without statin therapy and she has a history of statin myalgias   Lab Results  Component Value Date   CHOL 226 (H) 11/04/2022   HDL 95.00 11/04/2022   LDLCALC 116 (H) 11/04/2022   LDLDIRECT 115.0 11/04/2022   TRIG 77.0 11/04/2022   CHOLHDL 2 11/04/2022      Orders: -     Comprehensive metabolic panel -     Lipid panel -     LDL cholesterol, direct  Iron deficiency anemia due to chronic blood loss -     CBC with Differential/Platelet  Bilateral nonexudative age-related macular degeneration, unspecified stage  Macrocytosis without anemia Assessment & Plan: Secondary to b12 deficiency.   Repeat cbc due    B12 deficiency Assessment & Plan: Normal on current supplementation  Lab Results  Component Value Date   [id number] 433 11/04/2022     Orders: -     Methylmalonic acid, serum -     Vitamin B12  Gastroesophageal reflux disease with esophagitis without hemorrhage Assessment & Plan: With resultant Schatzki's ring by March 2023 EGD.  Continue omeprazole 20 mg bid    History of TIA (transient ischemic attack) Assessment & Plan: She has had a negative workup for carotid atherosclerosis,  etc,  and wants to stop Plavix bc of excessive bruising     Vitamin D deficiency -     VITAMIN D 25 Hydroxy (Vit-D Deficiency, Fractures)  Osteopenia of neck of left femur Assessment & Plan: By 2951 DEXA .  Repeat in 2025 taking PPI bid. Due to esophageal stricture.  Checking vitamin D    Overweight (BMI 25.0-29.9) Assessment & Plan: Intentionally losing weight   History of vertebral fracture Assessment & Plan: DEXA reviewed 2021.  Osteopenia of hip.  Repeat in 2025    Spinal stenosis of lumbar region without neurogenic claudication Assessment & Plan: S/p 2nd ablation nov 2023  with diminishing returns .  Nexgt one in May.  Staying active.    Chronic insomnia Assessment & Plan: Alprazolam was not helpful.  Advised to try relaxium    Caregiver with fatigue Assessment & Plan: 30  minutes was spent with patient more than half of which was spent in counseling patient on management of her frustration surrounding her husband's physical decline and withdrawal   Other orders -     Omeprazole; Take 1 capsule (20 mg total) by mouth 2 (two) times daily before a meal.  Dispense: 180 capsule; Refill: 1 -  Ergocalciferol; Take 1 capsule (50,000 Units total) by mouth once a week.  Dispense: 4 capsule; Refill: 0     Follow-up: Return in about 6 months (around 05/05/2023).   Crecencio Mc, MD

## 2022-11-04 NOTE — Assessment & Plan Note (Signed)
She has had a negative workup for carotid atherosclerosis,  etc,  and wants to stop Plavix bc of excessive bruising

## 2022-11-04 NOTE — Assessment & Plan Note (Signed)
Secondary to b12 deficiency.   Repeat cbc due

## 2022-11-04 NOTE — Assessment & Plan Note (Signed)

## 2022-11-04 NOTE — Assessment & Plan Note (Signed)
By 2909 DEXA .  Repeat in 2025 taking PPI bid. Due to esophageal stricture.  Checking vitamin D

## 2022-11-04 NOTE — Assessment & Plan Note (Addendum)
Thyroid function is normal continue.    Levothyroxine 88 mcg daily .     Lab Results  Component Value Date   TSH 1.81 11/04/2022  n

## 2022-11-05 MED ORDER — ERGOCALCIFEROL 1.25 MG (50000 UT) PO CAPS: 50000.0000 [IU] | ORAL_CAPSULE | ORAL | 0 refills | Status: DC

## 2022-11-05 NOTE — Assessment & Plan Note (Signed)
Normal on current supplementation  Lab Results  Component Value Date   VITAMINB12 459 11/04/2022

## 2022-11-05 NOTE — Addendum Note (Signed)
Addended by: Crecencio Mc on: 11/05/2022 01:17 PM   Modules accepted: Orders

## 2022-11-06 LAB — METHYLMALONIC ACID, SERUM: Methylmalonic Acid, Quant: 73 nmol/L — ABNORMAL LOW (ref 87–318)

## 2022-11-23 ENCOUNTER — Other Ambulatory Visit: Payer: Self-pay | Admitting: Internal Medicine

## 2022-11-25 DIAGNOSIS — H353131 Nonexudative age-related macular degeneration, bilateral, early dry stage: Secondary | ICD-10-CM | POA: Diagnosis not present

## 2022-11-25 DIAGNOSIS — H2513 Age-related nuclear cataract, bilateral: Secondary | ICD-10-CM | POA: Diagnosis not present

## 2022-12-02 ENCOUNTER — Other Ambulatory Visit: Payer: Self-pay | Admitting: Internal Medicine

## 2023-01-06 IMAGING — RF DG SWALLOWING FUNCTION
11 series · 19 of 24 positions shown · non-contrast
Comparison: Report from esophagram 08/21/2001 (images unavailable).

CLINICAL DATA: Dysphagia, unspecified type.

EXAM:
MODIFIED BARIUM SWALLOW
TECHNIQUE: Different consistencies of barium were administered orally to the
patient by the Speech Pathologist. Imaging of the pharynx was
performed in the lateral projection. The radiologist was present in
the fluoroscopy room for this study, providing personal supervision.
FLUOROSCOPY TIME:  Fluoroscopy Time:  1 minute, 24 seconds.
Radiation Exposure Index (if provided by the fluoroscopic device):
11.90 mGy

[Series 1: cp_standard · 0.08mm/px · 2 of 134 frames shown (1 of 11)]
[frame 21/134]
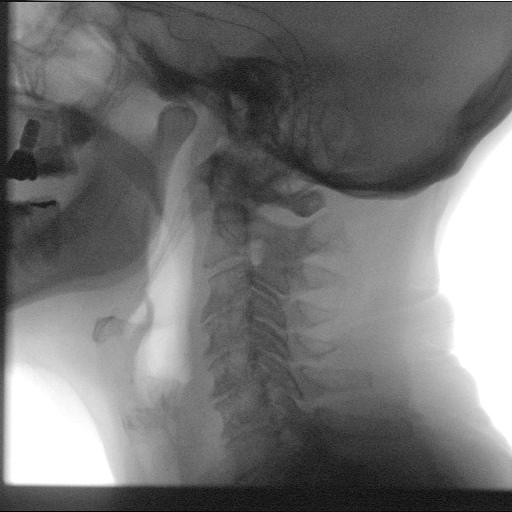
[frame 97/134]
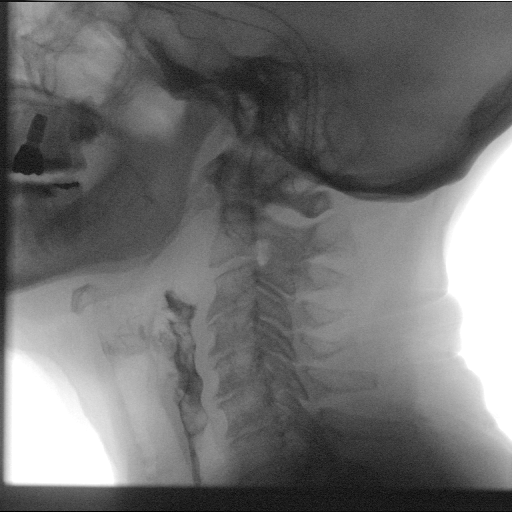

[Series 2: cp_standard · 0.08mm/px · 2 of 114 frames shown (2 of 11)]
[frame 58/114]
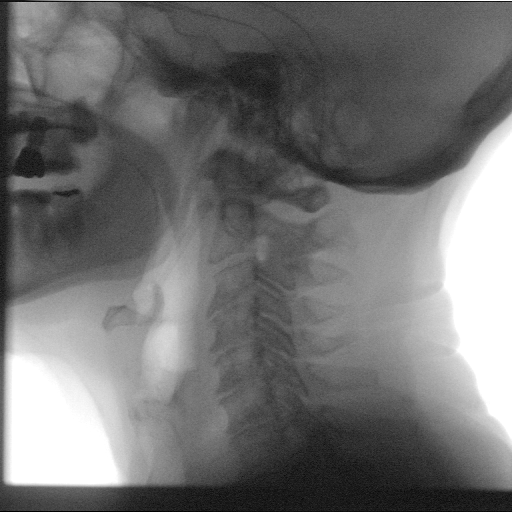
[frame 97/114]
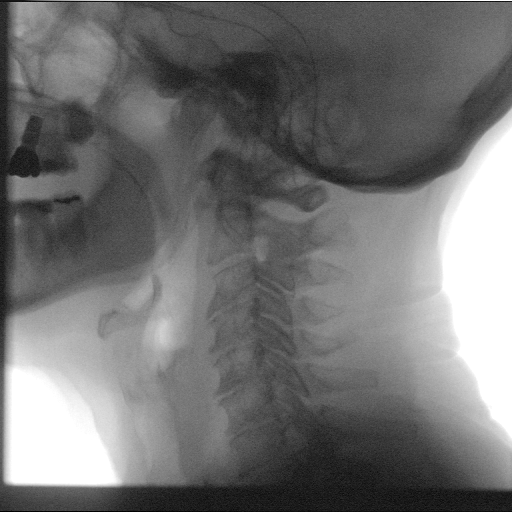

[Series 3: cp_standard · 0.08mm/px · 2 of 155 frames shown (3 of 11)]
[frame 78/155]
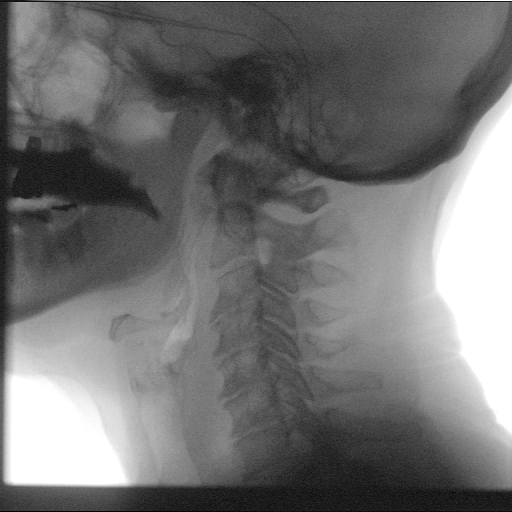
[frame 96/155]
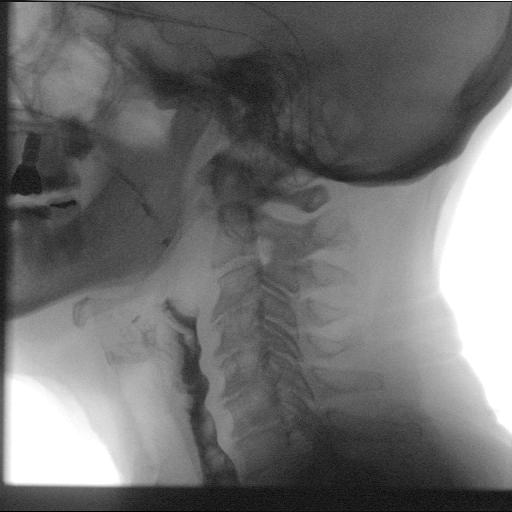

[Series 4: cp_standard · 0.08mm/px · 1 of 243 frames shown (4 of 11)]
[frame 147/243]
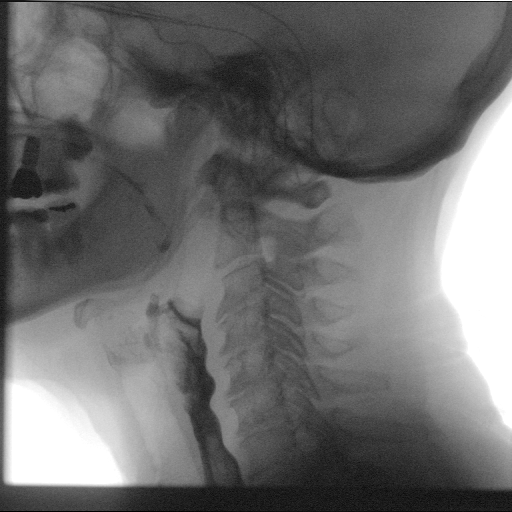

[Series 5: cp_standard · 0.08mm/px · 2 of 271 frames shown (5 of 11)]
[frame 41/271]
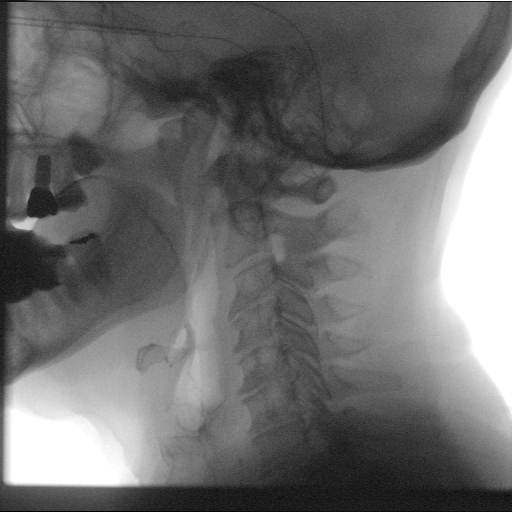
[frame 136/271]
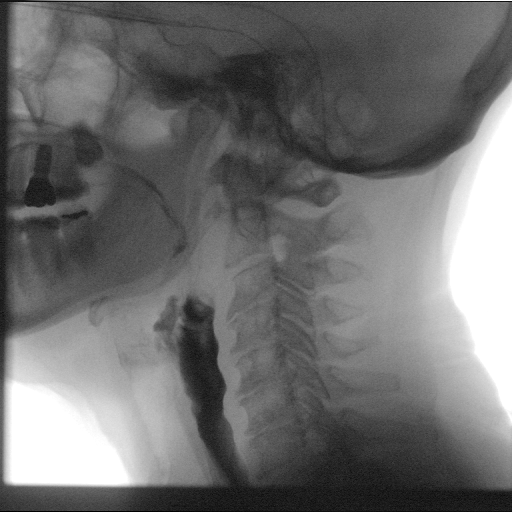

[Series 6: cp_standard · 0.08mm/px · 2 of 165 frames shown (6 of 11)]
[frame 83/165]
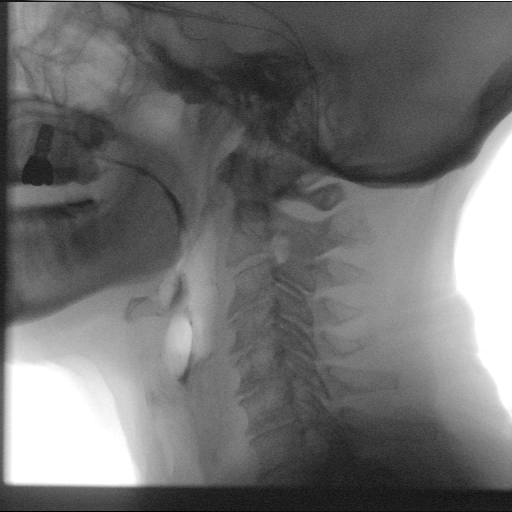
[frame 165/165]
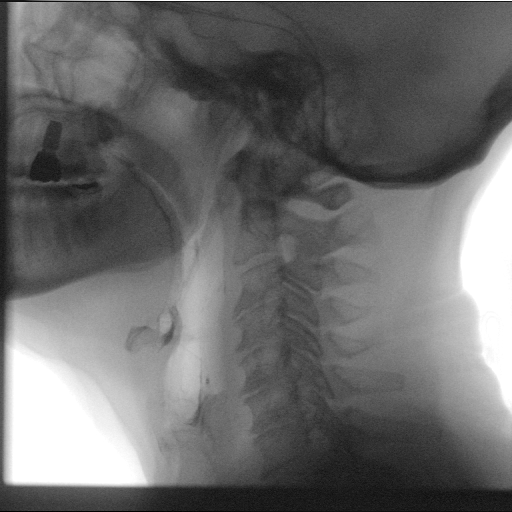

[Series 7: cp_standard · 0.08mm/px · 2 of 158 frames shown (7 of 11)]
[frame 24/158]
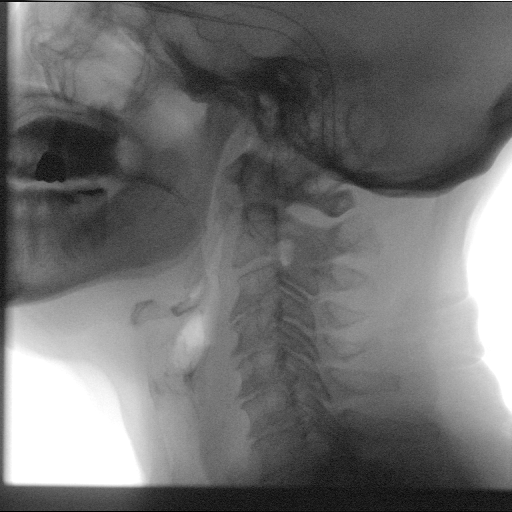
[frame 80/158]
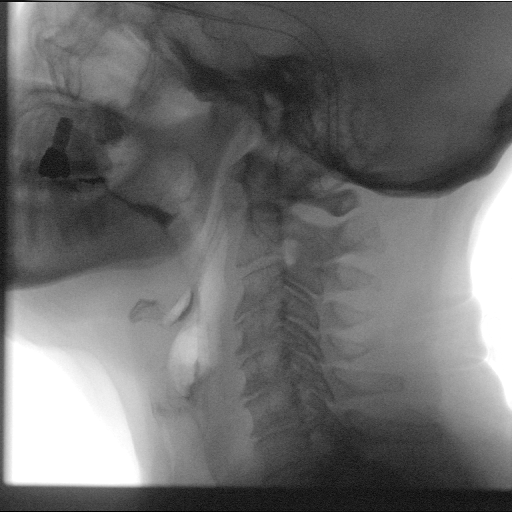

[Series 8: cp_standard · 0.08mm/px · 2 of 194 frames shown (8 of 11)]
[frame 98/194]
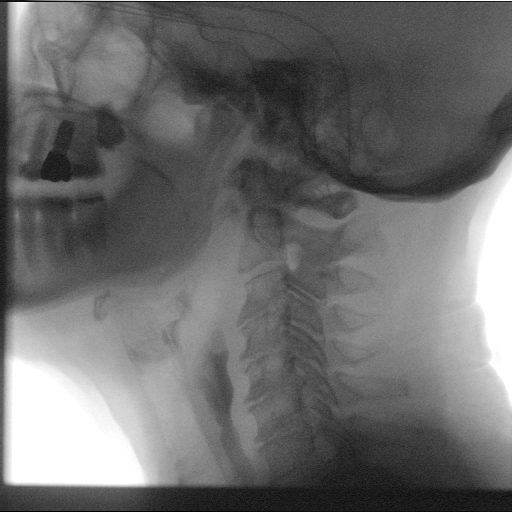
[frame 165/194]
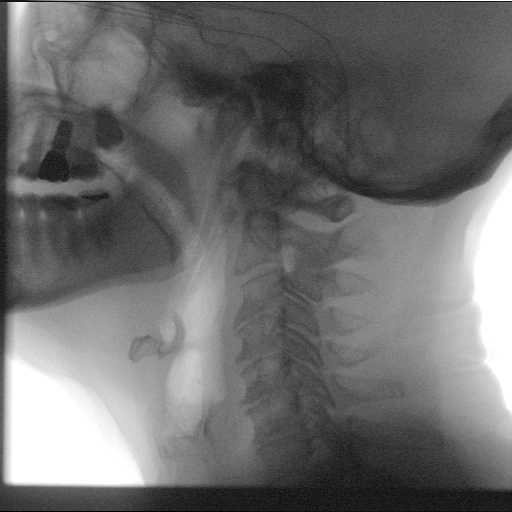

[Series 9: cp_standard · 0.08mm/px · 2 of 182 frames shown (9 of 11)]
[frame 28/182]
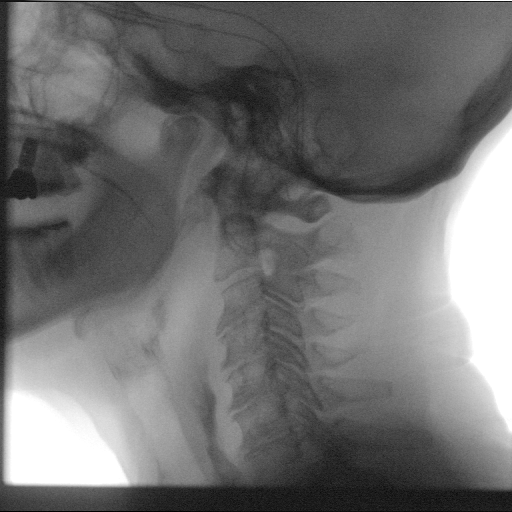
[frame 155/182]
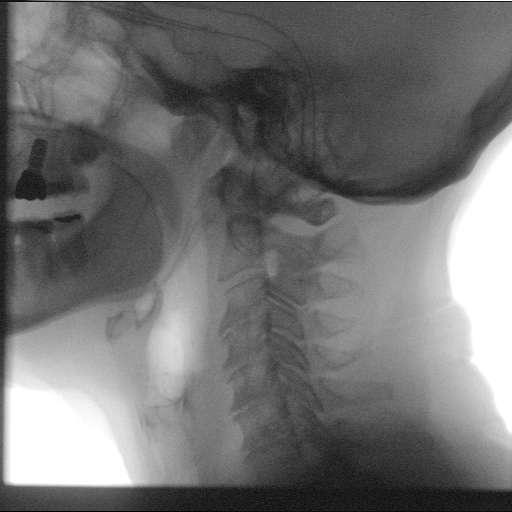

[Series 10: cp_standard · 0.08mm/px · 1 of 501 frames shown (10 of 11)]
[frame 426/501]
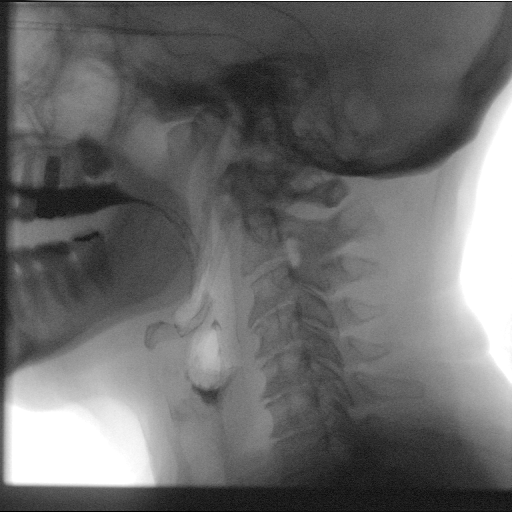

[Series 11: cp_standard · 0.08mm/px · 1 of 1 slices shown (11 of 11)]
[im 1/1]
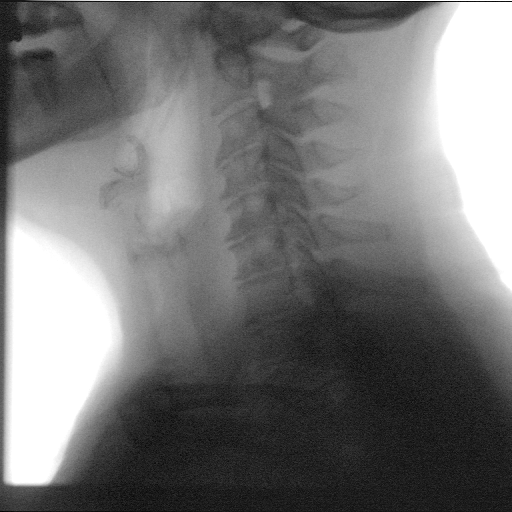

[19 of 24 positions shown; findings below may reference images not displayed]

FINDINGS: Different consistencies of barium were administered orally to the
patient by the Speech Pathologist with fluoroscopic imaging of the
pharynx from a lateral projection. Onglerie Aek, PA was present in
the fluoroscopy room for this study, providing personal supervision.
The Speech Pathologist did not observe aspiration. Irregular contour
of the anterior hypopharynx/upper cervical esophagus at the C5-C6
level (best appreciated on series 10, image 85). Prominent
cricopharyngeus muscle. Please refer to the Speech Pathologist's
report for full details.

Impression #2 will be called to the ordering clinician or
representative by the Radiologist Assistant, and communication
documented in the PACS or [REDACTED].
IMPRESSION: Modified barium swallow, as described. No aspiration was observed by
the Speech Pathologist.

Irregular contour of the anterior hypopharynx/upper cervical
esophagus at the C5-C6 level. Endoscopy is recommended to exclude a
mucosal/submucosal mass or lesion at this site.

Prominent cricopharyngeus muscle.

Please refer to the Speech Pathologist's report for additional
details and recommendations.

## 2023-01-14 DIAGNOSIS — M47817 Spondylosis without myelopathy or radiculopathy, lumbosacral region: Secondary | ICD-10-CM | POA: Diagnosis not present

## 2023-01-14 DIAGNOSIS — M5136 Other intervertebral disc degeneration, lumbar region: Secondary | ICD-10-CM | POA: Diagnosis not present

## 2023-01-14 DIAGNOSIS — M47812 Spondylosis without myelopathy or radiculopathy, cervical region: Secondary | ICD-10-CM | POA: Diagnosis not present

## 2023-01-28 DIAGNOSIS — M545 Low back pain, unspecified: Secondary | ICD-10-CM | POA: Diagnosis not present

## 2023-02-03 DIAGNOSIS — M47812 Spondylosis without myelopathy or radiculopathy, cervical region: Secondary | ICD-10-CM | POA: Diagnosis not present

## 2023-02-03 DIAGNOSIS — M5136 Other intervertebral disc degeneration, lumbar region: Secondary | ICD-10-CM | POA: Diagnosis not present

## 2023-02-03 DIAGNOSIS — M47817 Spondylosis without myelopathy or radiculopathy, lumbosacral region: Secondary | ICD-10-CM | POA: Diagnosis not present

## 2023-02-18 DIAGNOSIS — M47812 Spondylosis without myelopathy or radiculopathy, cervical region: Secondary | ICD-10-CM | POA: Diagnosis not present

## 2023-02-18 DIAGNOSIS — M5136 Other intervertebral disc degeneration, lumbar region: Secondary | ICD-10-CM | POA: Diagnosis not present

## 2023-02-18 DIAGNOSIS — M47817 Spondylosis without myelopathy or radiculopathy, lumbosacral region: Secondary | ICD-10-CM | POA: Diagnosis not present

## 2023-02-27 ENCOUNTER — Other Ambulatory Visit: Payer: Self-pay | Admitting: Internal Medicine

## 2023-02-27 DIAGNOSIS — M47817 Spondylosis without myelopathy or radiculopathy, lumbosacral region: Secondary | ICD-10-CM | POA: Diagnosis not present

## 2023-03-24 DIAGNOSIS — M5136 Other intervertebral disc degeneration, lumbar region: Secondary | ICD-10-CM | POA: Diagnosis not present

## 2023-03-24 DIAGNOSIS — M47817 Spondylosis without myelopathy or radiculopathy, lumbosacral region: Secondary | ICD-10-CM | POA: Diagnosis not present

## 2023-03-24 DIAGNOSIS — M47812 Spondylosis without myelopathy or radiculopathy, cervical region: Secondary | ICD-10-CM | POA: Diagnosis not present

## 2023-05-05 ENCOUNTER — Ambulatory Visit: Payer: Medicare HMO | Admitting: Internal Medicine

## 2023-05-07 ENCOUNTER — Ambulatory Visit (INDEPENDENT_AMBULATORY_CARE_PROVIDER_SITE_OTHER): Payer: Medicare HMO | Admitting: Internal Medicine

## 2023-05-07 VITALS — BP 126/72 | HR 73 | Temp 97.9°F | Resp 16 | Ht 64.0 in | Wt 147.6 lb

## 2023-05-07 DIAGNOSIS — E7800 Pure hypercholesterolemia, unspecified: Secondary | ICD-10-CM

## 2023-05-07 DIAGNOSIS — R933 Abnormal findings on diagnostic imaging of other parts of digestive tract: Secondary | ICD-10-CM

## 2023-05-07 DIAGNOSIS — E034 Atrophy of thyroid (acquired): Secondary | ICD-10-CM | POA: Diagnosis not present

## 2023-05-07 DIAGNOSIS — M159 Polyosteoarthritis, unspecified: Secondary | ICD-10-CM

## 2023-05-07 DIAGNOSIS — M85852 Other specified disorders of bone density and structure, left thigh: Secondary | ICD-10-CM | POA: Diagnosis not present

## 2023-05-07 DIAGNOSIS — D649 Anemia, unspecified: Secondary | ICD-10-CM

## 2023-05-07 DIAGNOSIS — M150 Primary generalized (osteo)arthritis: Secondary | ICD-10-CM

## 2023-05-07 DIAGNOSIS — R5383 Other fatigue: Secondary | ICD-10-CM | POA: Diagnosis not present

## 2023-05-07 DIAGNOSIS — K2100 Gastro-esophageal reflux disease with esophagitis, without bleeding: Secondary | ICD-10-CM | POA: Diagnosis not present

## 2023-05-07 DIAGNOSIS — D500 Iron deficiency anemia secondary to blood loss (chronic): Secondary | ICD-10-CM

## 2023-05-07 DIAGNOSIS — F5104 Psychophysiologic insomnia: Secondary | ICD-10-CM | POA: Diagnosis not present

## 2023-05-07 DIAGNOSIS — K222 Esophageal obstruction: Secondary | ICD-10-CM

## 2023-05-07 LAB — CBC WITH DIFFERENTIAL/PLATELET
Basophils Absolute: 0 10*3/uL (ref 0.0–0.1)
Basophils Relative: 0.7 % (ref 0.0–3.0)
Eosinophils Absolute: 0.3 10*3/uL (ref 0.0–0.7)
Eosinophils Relative: 4.4 % (ref 0.0–5.0)
HCT: 35.5 % — ABNORMAL LOW (ref 36.0–46.0)
Hemoglobin: 11.5 g/dL — ABNORMAL LOW (ref 12.0–15.0)
Lymphocytes Relative: 16.7 % (ref 12.0–46.0)
Lymphs Abs: 1 10*3/uL (ref 0.7–4.0)
MCHC: 32.5 g/dL (ref 30.0–36.0)
MCV: 94.5 fl (ref 78.0–100.0)
Monocytes Absolute: 0.7 10*3/uL (ref 0.1–1.0)
Monocytes Relative: 12.9 % — ABNORMAL HIGH (ref 3.0–12.0)
Neutro Abs: 3.7 10*3/uL (ref 1.4–7.7)
Neutrophils Relative %: 65.3 % (ref 43.0–77.0)
Platelets: 301 10*3/uL (ref 150.0–400.0)
RBC: 3.75 Mil/uL — ABNORMAL LOW (ref 3.87–5.11)
RDW: 14.9 % (ref 11.5–15.5)
WBC: 5.7 10*3/uL (ref 4.0–10.5)

## 2023-05-07 LAB — COMPREHENSIVE METABOLIC PANEL
ALT: 12 U/L (ref 0–35)
AST: 18 U/L (ref 0–37)
Albumin: 4 g/dL (ref 3.5–5.2)
Alkaline Phosphatase: 74 U/L (ref 39–117)
BUN: 20 mg/dL (ref 6–23)
CO2: 28 mEq/L (ref 19–32)
Calcium: 8.9 mg/dL (ref 8.4–10.5)
Chloride: 103 mEq/L (ref 96–112)
Creatinine, Ser: 0.84 mg/dL (ref 0.40–1.20)
GFR: 65.09 mL/min (ref >60.00)
Glucose, Bld: 93 mg/dL (ref 70–99)
Potassium: 4.4 mEq/L (ref 3.5–5.1)
Sodium: 138 mEq/L (ref 135–145)
Total Bilirubin: 0.5 mg/dL (ref 0.2–1.2)
Total Protein: 6.3 g/dL (ref 6.0–8.3)

## 2023-05-07 LAB — LDL CHOLESTEROL, DIRECT: Direct LDL: 104 mg/dL

## 2023-05-07 LAB — LIPID PANEL
Cholesterol: 201 mg/dL — ABNORMAL HIGH (ref 0–200)
HDL: 94.2 mg/dL (ref >39.00)
LDL Cholesterol: 89 mg/dL (ref 0–99)
NonHDL: 106.87
Total CHOL/HDL Ratio: 2
Triglycerides: 88 mg/dL (ref 0.0–149.0)
VLDL: 17.6 mg/dL (ref 0.0–40.0)

## 2023-05-07 LAB — TSH: TSH: 0.73 u[IU]/mL (ref 0.35–5.50)

## 2023-05-07 MED ORDER — LEVOTHYROXINE SODIUM 88 MCG PO TABS: ORAL_TABLET | ORAL | 0 refills | Status: DC

## 2023-05-07 MED ORDER — ZOLPIDEM TARTRATE ER 6.25 MG PO TBCR: 6.2500 mg | EXTENDED_RELEASE_TABLET | Freq: Every evening | ORAL | 0 refills | Status: AC | PRN

## 2023-05-07 MED ORDER — OMEPRAZOLE 20 MG PO CPDR: 20.0000 mg | DELAYED_RELEASE_CAPSULE | Freq: Two times a day (BID) | ORAL | 1 refills | Status: DC

## 2023-05-07 NOTE — Progress Notes (Signed)
Subjective:  Patient ID: Tammy Tate, female    DOB: 1941/07/13  Age: 82 y.o. MRN: [id number]  CC: The primary encounter diagnosis was Pure hypercholesterolemia. Diagnoses of Hypothyroidism due to acquired atrophy of thyroid, Iron deficiency anemia due to chronic blood loss, Gastroesophageal reflux disease with esophagitis without hemorrhage, Abnormal barium swallow, Primary osteoarthritis involving multiple joints, Caregiver with fatigue, Lower esophageal ring (Schatzki), Osteopenia of neck of left femur, and Chronic insomnia were also pertinent to this visit.   HPI Tammy Tate presents for  Chief Complaint  Patient presents with   Medical Management of Chronic Issues   1) Chronic neck and back pain:  she remains physically active in spite of pain.  Using ALEVE 2 times daily without symptoms of gastritis ;   taking omeprazole twice daily for GERD . Sees [facility] every 6 months for treatment using ablation on thoracolumbar spine.   2)Insomnia/anxiety : aggravated by Tammy Tate's refusal to walk or remain active.  She refuses to become sedentary but feels guilty when she takes trips without him.  She is using alprazolam for insomnia but  wakes up in the middle of the night worrying.  Wants to resume Tammy Tate .  Dicussed trial of the lower dose CR formulation to provide longer benefit.   3) Hypothyroid:  taking 88 mcg Synthroid  no longer taking biotin    Outpatient Medications Prior to Visit  Medication Sig Dispense Refill   Calcium-Magnesium-Vitamin D (CALCIUM 1200+D3 PO) Take 1 tablet by mouth. Ca 1200mg   D3     Multiple Vitamins-Minerals (PRESERVISION AREDS 2 PO)      Omega-3 Fatty Acids (OMEGA 3 PO) Take by mouth daily.     Turmeric 500 MG CAPS Take 2 capsules by mouth daily.      vitamin B-12 (CYANOCOBALAMIN) 1000 MCG tablet Take 3,000 mcg by mouth daily.     ALPRAZolam (XANAX) 0.25 MG tablet Take 1 tablet (0.25 mg total) by mouth at bedtime as needed for anxiety. 20 tablet 2    Biotin 5 MG TABS Take by mouth.     ergocalciferol (DRISDOL) 1.25 MG (50000 UT) capsule Take 1 capsule (50,000 Units total) by mouth once a week. 4 capsule 0   levothyroxine (SYNTHROID) 88 MCG tablet TAKE 1 TABLET BY MOUTH EVERY MORNING ON AN EMPTY STOMACH WITH A FULL GLASS OF WATER. TAKE AT LEAST 30-60 MINUTES BEFORE BREAKFAST. 90 tablet 0   omeprazole (PRILOSEC) 20 MG capsule Take 1 capsule (20 mg total) by mouth 2 (two) times daily before a meal. 180 capsule 1   No facility-administered medications prior to visit.    Review of Systems;  Patient denies headache, fevers, malaise, unintentional weight loss, skin rash, eye pain, sinus congestion and sinus pain, sore throat, dysphagia,  hemoptysis , cough, dyspnea, wheezing, chest pain, palpitations, orthopnea, edema, abdominal pain, nausea, melena, diarrhea, constipation, flank pain, dysuria, hematuria, urinary  Frequency, nocturia, numbness, tingling, seizures,  Focal weakness, Loss of consciousness,  Tremor,  depression, and suicidal ideation.      Objective:  BP 126/72   Pulse 73   Temp 97.9 F (36.6 C)   Resp 16   Ht 5\' 4"  (1.626 m)   Wt 147 lb 9.6 oz (67 kg)   SpO2 98%   BMI 25.34 kg/m   BP Readings from Last 3 Encounters:  05/07/23 126/72  11/04/22 112/72  05/01/22 122/68    Wt Readings from Last 3 Encounters:  05/07/23 147 lb 9.6 oz (67 kg)  11/04/22  147 lb 12.8 oz (67 kg)  05/01/22 150 lb 3.2 oz (68.1 kg)    Physical Exam Vitals reviewed.  Constitutional:      General: She is not in acute distress.    Appearance: Normal appearance. She is normal weight. She is not ill-appearing, toxic-appearing or diaphoretic.  HENT:     Head: Normocephalic.  Eyes:     General: No scleral icterus.       Right eye: No discharge.        Left eye: No discharge.     Conjunctiva/sclera: Conjunctivae normal.  Cardiovascular:     Rate and Rhythm: Normal rate and regular rhythm.     Heart sounds: Normal heart sounds.  Pulmonary:      Effort: Pulmonary effort is normal. No respiratory distress.     Breath sounds: Normal breath sounds.  Musculoskeletal:        General: Normal range of motion.  Skin:    General: Skin is warm and dry.  Neurological:     General: No focal deficit present.     Mental Status: She is alert and oriented to person, place, and time. Mental status is at baseline.  Psychiatric:        Mood and Affect: Mood normal.        Behavior: Behavior normal.        Thought Content: Thought content normal.        Judgment: Judgment normal.    Lab Results  Component Value Date   HGBA1C 5.1 11/16/2020   HGBA1C 5.1 06/17/2016    Lab Results  Component Value Date   CREATININE 0.84 05/07/2023   CREATININE 0.93 11/04/2022   CREATININE 0.91 03/14/2022    Lab Results  Component Value Date   WBC 5.7 05/07/2023   HGB 11.5 (L) 05/07/2023   HCT 35.5 (L) 05/07/2023   PLT 301.0 05/07/2023   GLUCOSE 93 05/07/2023   CHOL 201 (H) 05/07/2023   TRIG 88.0 05/07/2023   HDL 94.20 05/07/2023   LDLDIRECT 104.0 05/07/2023   LDLCALC 89 05/07/2023   ALT 12 05/07/2023   AST 18 05/07/2023   NA 138 05/07/2023   K 4.4 05/07/2023   CL 103 05/07/2023   CREATININE 0.84 05/07/2023   BUN 20 05/07/2023   CO2 28 05/07/2023   TSH 0.73 05/07/2023   HGBA1C 5.1 11/16/2020   MICROALBUR 1.3 06/17/2016    No results found.  Assessment & Plan:  .Pure hypercholesterolemia -     Comprehensive metabolic panel -     Lipid panel -     LDL cholesterol, direct  Hypothyroidism due to acquired atrophy of thyroid Assessment & Plan: Thyroid function is normal continue.    Levothyroxine 88 mcg daily .     Lab Results  Component Value Date   TSH 0.73 05/07/2023  n    Orders: -     TSH  Iron deficiency anemia due to chronic blood loss -     CBC with Differential/Platelet  Gastroesophageal reflux disease with esophagitis without hemorrhage Assessment & Plan: With resultant Schatzki's ring noted on  March 2023  EGD.  Continue omeprazole 20 mg bid    Abnormal barium swallow Assessment & Plan: Secondary to Schatzki's ring by follow up EGD.  Continue GERD meds bid    Primary osteoarthritis involving multiple joints Assessment & Plan: Symptoms controlled with stretching,  aleve,  And yoga    Caregiver with fatigue Assessment & Plan: 40  minutes was spent with patient more than  half of which was spent in counseling patient on management of her frustration surrounding her husband's physical decline and withdrawal   Lower esophageal ring (Schatzki) Assessment & Plan: Noted and dilated on march 2023 EGD.  Has resumed bid use of  omeprazole as directed by GI.     Osteopenia of neck of left femur Assessment & Plan: By 9604 DEXA .  Repeat in 2025 t. aking PPI bid. Due to esophageal stricture.  Checking vitamin D    Chronic insomnia Assessment & Plan: Not adequately managed with alprazolam low dose.  She requested return to Tammy Tate.  CR formula prescribed   Other orders -     Levothyroxine Sodium; TAKE 1 TABLET BY MOUTH EVERY MORNING ON AN EMPTY STOMACH WITH A FULL GLASS OF WATER. TAKE AT LEAST 30-60 MINUTES BEFORE BREAKFAST.  Dispense: 90 tablet; Refill: 0 -     Omeprazole; Take 1 capsule (20 mg total) by mouth 2 (two) times daily before a meal.  Dispense: 180 capsule; Refill: 1 -     Zolpidem Tartrate ER; Take 1 tablet (6.25 mg total) by mouth at bedtime as needed for sleep.  Dispense: 30 tablet; Refill: 0     I provided  40 minutes on the day of this face-to-face  encounter reviewing patient's last visit with me, patient's  most recent visit with Orthopedics, prior  surgical and non surgical procedures, previous  labs and imaging studies, counseling on currently addressed issues,  and post visit ordering to diagnostics and therapeutics .   Follow-up: No follow-ups on file.   Sherlene Shams, MD

## 2023-05-07 NOTE — Patient Instructions (Addendum)
The generic ambien  CR (controlled release)  has been sent to [facility] [employer] [facility] can add up to 2000 mg of acetominophen (tylenol) every day safely  In divided doses (500 mg every 6 hours  Or 1000 mg every 12 hours.)

## 2023-05-09 DIAGNOSIS — D649 Anemia, unspecified: Secondary | ICD-10-CM | POA: Insufficient documentation

## 2023-05-09 NOTE — Assessment & Plan Note (Signed)
By 2909 DEXA .  Repeat in 2025 taking PPI bid. Due to esophageal stricture.  Checking vitamin D

## 2023-05-09 NOTE — Assessment & Plan Note (Signed)
Not adequately managed with alprazolam low dose.  She requested return to [place].  CR formula prescribed

## 2023-05-09 NOTE — Assessment & Plan Note (Addendum)
40  minutes was spent with patient more than half of which was spent in counseling patient on management of her frustration surrounding her husband's physical decline and withdrawal

## 2023-05-09 NOTE — Assessment & Plan Note (Signed)
Secondary to Schatzki's ring by follow up EGD.  Continue GERD meds bid

## 2023-05-09 NOTE — Addendum Note (Signed)
Addended by: Sherlene Shams on: 05/09/2023 02:52 PM   Modules accepted: Orders

## 2023-05-09 NOTE — Assessment & Plan Note (Signed)
Thyroid function is normal continue.    Levothyroxine 88 mcg daily .     Lab Results  Component Value Date   TSH 0.73 05/07/2023  n

## 2023-05-09 NOTE — Assessment & Plan Note (Signed)
Symptoms controlled with stretching,  aleve,  And yoga

## 2023-05-09 NOTE — Assessment & Plan Note (Signed)
With resultant Schatzki's ring noted on  March 2023 EGD.  Continue omeprazole 20 mg bid

## 2023-05-09 NOTE — Assessment & Plan Note (Signed)
Noted and dilated on march 2023 EGD.  Has resumed bid use of  omeprazole as directed by GI.

## 2023-05-27 DIAGNOSIS — H2513 Age-related nuclear cataract, bilateral: Secondary | ICD-10-CM | POA: Diagnosis not present

## 2023-05-27 DIAGNOSIS — H353131 Nonexudative age-related macular degeneration, bilateral, early dry stage: Secondary | ICD-10-CM | POA: Diagnosis not present

## 2023-08-04 DIAGNOSIS — H353131 Nonexudative age-related macular degeneration, bilateral, early dry stage: Secondary | ICD-10-CM | POA: Diagnosis not present

## 2023-08-04 DIAGNOSIS — H353112 Nonexudative age-related macular degeneration, right eye, intermediate dry stage: Secondary | ICD-10-CM | POA: Diagnosis not present

## 2023-08-04 DIAGNOSIS — H353221 Exudative age-related macular degeneration, left eye, with active choroidal neovascularization: Secondary | ICD-10-CM | POA: Diagnosis not present

## 2023-08-04 DIAGNOSIS — H2513 Age-related nuclear cataract, bilateral: Secondary | ICD-10-CM | POA: Diagnosis not present

## 2023-08-04 DIAGNOSIS — Z961 Presence of intraocular lens: Secondary | ICD-10-CM | POA: Diagnosis not present

## 2023-08-15 ENCOUNTER — Ambulatory Visit (INDEPENDENT_AMBULATORY_CARE_PROVIDER_SITE_OTHER): Payer: Medicare HMO

## 2023-08-15 DIAGNOSIS — Z23 Encounter for immunization: Secondary | ICD-10-CM | POA: Diagnosis not present

## 2023-08-25 ENCOUNTER — Other Ambulatory Visit: Payer: Self-pay | Admitting: Internal Medicine

## 2023-09-01 ENCOUNTER — Encounter: Payer: Self-pay | Admitting: Internal Medicine

## 2023-09-01 ENCOUNTER — Ambulatory Visit (INDEPENDENT_AMBULATORY_CARE_PROVIDER_SITE_OTHER): Payer: Medicare HMO | Admitting: *Deleted

## 2023-09-01 ENCOUNTER — Other Ambulatory Visit: Payer: Self-pay | Admitting: Internal Medicine

## 2023-09-01 VITALS — Ht 63.0 in | Wt 148.0 lb

## 2023-09-01 DIAGNOSIS — M25562 Pain in left knee: Secondary | ICD-10-CM | POA: Diagnosis not present

## 2023-09-01 DIAGNOSIS — M7052 Other bursitis of knee, left knee: Secondary | ICD-10-CM | POA: Diagnosis not present

## 2023-09-01 DIAGNOSIS — Z0000 Encounter for general adult medical examination without abnormal findings: Secondary | ICD-10-CM

## 2023-09-01 NOTE — Patient Instructions (Signed)
Ms. Tammy Tate , Thank you for taking time to come for your Medicare Wellness Visit. I appreciate your ongoing commitment to your health goals. Please review the following plan we discussed and let me know if I can assist you in the future.   Referrals/Orders/Follow-Ups/Clinician Recommendations: Remember to update your Covid vaccine  This is a list of the screening recommended for you and due dates:  Health Maintenance  Topic Date Due   COVID-19 Vaccine (6 - 2023-24 season) 06/01/2023   Medicare Annual Wellness Visit  08/31/2024   DTaP/Tdap/Td vaccine (3 - Td or Tdap) 05/19/2030   Pneumonia Vaccine  Completed   Flu Shot  Completed   DEXA scan (bone density measurement)  Completed   Zoster (Shingles) Vaccine  Completed   HPV Vaccine  Aged Out   Hepatitis C Screening  Discontinued    Advanced directives: (Copy Requested) Please bring a copy of your health care power of attorney and living will to the office to be added to your chart at your convenience.  Next Medicare Annual Wellness Visit scheduled for next year: Yes 09/07/24 @ 8:10

## 2023-09-01 NOTE — Progress Notes (Signed)
Subjective:   Tammy Tate is a 82 y.o. female who presents for Medicare Annual (Subsequent) preventive examination.  Visit Complete: Virtual I connected with  Tammy Tate on 09/01/23 by a audio enabled telemedicine application and verified that I am speaking with the correct person using two identifiers.  Patient Location: Home  Provider Location: Office/Clinic  I discussed the limitations of evaluation and management by telemedicine. The patient expressed understanding and agreed to proceed.  Vital Signs: Because this visit was a virtual/telehealth visit, some criteria may be missing or patient reported. Any vitals not documented were not able to be obtained and vitals that have been documented are patient reported.  Patient Medicare AWV questionnaire was completed by the patient on 08/31/23; I have confirmed that all information answered by patient is correct and no changes since this date.  Cardiac Risk Factors include: advanced age (>83men, >27 women);dyslipidemia     Objective:    Today's Vitals   09/01/23 0817  Weight: 148 lb (67.1 kg)  Height: 5\' 3"  (1.6 m)   Body mass index is 26.22 kg/m.     09/01/2023    8:31 AM 04/22/2022   11:22 AM 12/04/2021    8:34 AM 03/06/2021   10:05 AM 06/10/2019    9:48 AM 09/08/2018    8:14 AM 09/02/2017    8:50 AM  Advanced Directives  Does Patient Have a Medical Advance Directive? Yes Yes Yes Yes Yes Yes Yes  Type of Estate agent of [place];Living will Healthcare Power of [place];Living will Healthcare Power of [place];Living will Healthcare Power of [place];Living will Living will Healthcare Power of [place];Living will Living will;Healthcare Power of Attorney  Does patient want to make changes to medical advance directive?  No - Patient declined  No - Patient declined  No - Patient declined No - Patient declined  Copy of Healthcare Power of Attorney in Chart? No - copy requested Yes - validated most recent  copy scanned in chart (See row information) No - copy requested No - copy requested  Yes - validated most recent copy scanned in chart (See row information) No - copy requested    Current Medications (verified) Outpatient Encounter Medications as of 09/01/2023  Medication Sig   Calcium-Magnesium-Vitamin D (CALCIUM 1200+D3 PO) Take 1 tablet by mouth. Ca 1200mg   D3   levothyroxine (SYNTHROID) 88 MCG tablet TAKE 1 TABLET BY MOUTH EVERY MORNING ON AN EMPTY STOMACH WITH A FULL GLASS OF WATER. TAKE AT LEAST 30-60 MINUTES BEFORE BREAKFAST.   Multiple Vitamins-Minerals (PRESERVISION AREDS 2 PO)    Omega-3 Fatty Acids (OMEGA 3 PO) Take by mouth daily.   omeprazole (PRILOSEC) 20 MG capsule Take 1 capsule (20 mg total) by mouth 2 (two) times daily before a meal.   Turmeric 500 MG CAPS Take 2 capsules by mouth daily.    zolpidem (AMBIEN CR) 6.25 MG CR tablet Take 1 tablet (6.25 mg total) by mouth at bedtime as needed for sleep.   vitamin B-12 (CYANOCOBALAMIN) 1000 MCG tablet Take 3,000 mcg by mouth daily. (Patient not taking: Reported on 09/01/2023)   No facility-administered encounter medications on file as of 09/01/2023.    Allergies (verified) Atorvastatin   History: Past Medical History:  Diagnosis Date   Allergy    B12 deficiency    Bronchitis    hx of recurrent and bronchospasm   Cancer (HCC) 2010   DCIS , right   Chicken pox    Degenerative arthritis of lumbar spine  Degenerative disk disease    KAM 08-04-2007   GERD (gastroesophageal reflux disease)    Migraine with aura    Thyroid disease    Vasomotor rhinitis    Past Surgical History:  Procedure Laterality Date   ABDOMINAL HYSTERECTOMY     with prior HRT   APPENDECTOMY     COLONOSCOPY WITH PROPOFOL N/A 06/10/2019   Procedure: COLONOSCOPY WITH PROPOFOL;  Surgeon: Wyline Mood, MD;  Location: [facility] ENDOSCOPY;  Service: Gastroenterology;  Laterality: N/A;   ESOPHAGOGASTRODUODENOSCOPY (EGD) WITH PROPOFOL N/A 06/10/2019    Procedure: ESOPHAGOGASTRODUODENOSCOPY (EGD) WITH PROPOFOL;  Surgeon: Wyline Mood, MD;  Location: [facility] END[facility]COPY;  Service: Gastroenterology;  Laterality: N/A;   ESOPHAGOGASTRODUODENOSCOPY (EGD) WITH PROPOFOL N/A 12/04/2021   Procedure: ESOPHAGOGASTRODUODENOSCOPY (EGD) WITH PROPOFOL;  Surgeon: Wyline Mood, MD;  Location: [facility] ENDOSCOPY;  Service: Gastroenterology;  Laterality: N/A;   MODIFIED RADICAL MASTECTOMY W/ AXILLARY LYMPH NODE DISSECTION W/ PECTORALIS MINOR PRESERVATION  2010   bilateral, for DCIS,  [facility] , [facility]   Family History  Problem Relation Age of Onset   Cancer Mother 34       esophgeal CA mets to lungs, spine    Stroke Father 68       tobacco abuse, hyperlipidemia   Hyperlipidemia Father    Heart disease Father    Diabetes Maternal Grandfather    Pancreatitis Daughter    Social History   Socioeconomic History   Marital status: Married    Spouse name: Not on file   Number of children: Not on file   Years of education: Not on file   Highest education level: Not on file  Occupational History   Not on file  Tobacco Use   Smoking status: Former    Current packs/day: 0.00    Types: Cigarettes    Quit date: 10/23/1971    Years since quitting: 51.8   Smokeless tobacco: Never  Vaping Use   Vaping status: Never Used  Substance and Sexual Activity   Alcohol use: Yes    Alcohol/week: 7.0 standard drinks of alcohol    Types: 7 Glasses of wine per week    Comment: occasional   Drug use: No   Sexual activity: Not Currently  Other Topics Concern   Not on file  Social History Narrative   married   Social Determinants of Health   Financial Resource Strain: Low Risk  (08/31/2023)   Overall Financial Resource Strain (CARDIA)    Difficulty of Paying Living Expenses: Not hard at all  Food Insecurity: No Food Insecurity (08/31/2023)   Hunger Vital Sign    Worried About Running Out of Food in the Last Year: Never true    Ran Out of Food in the Last Year: Never true   Transportation Needs: No Transportation Needs (08/31/2023)   PRAPARE - Administrator, Civil Service (Medical): No    Lack of Transportation (Non-Medical): No  Physical Activity: Insufficiently Active (08/31/2023)   Exercise Vital Sign    Days of Exercise per Week: 3 days    Minutes of Exercise per Session: 30 min  Stress: No Stress Concern Present (08/31/2023)   [employer] of Occupational Health - Occupational Stress Questionnaire    Feeling of Stress : Not at all  Social Connections: Socially Integrated (08/31/2023)   Social Connection and Isolation Panel [NHANES]    Frequency of Communication with Friends and Family: More than three times a week    Frequency of Social Gatherings with Friends and Family: More than  three times a week    Attends Religious Services: More than 4 times per year    Active Member of Clubs or Organizations: Yes    Attends Engineer, structural: More than 4 times per year    Marital Status: Married    Tobacco Counseling Counseling given: Not Answered   Clinical Intake:  Pre-visit preparation completed: Yes  Pain : No/denies pain     BMI - recorded: 26.22 Nutritional Status: BMI of 19-24  Normal Nutritional Risks: None Diabetes: No  How often do you need to have someone help you when you read instructions, pamphlets, or other written materials from your doctor or pharmacy?: 1 - Never  Interpreter Needed?: No  Information entered by :: R. Mistina Coatney LPN   Activities of Daily Living    08/31/2023   12:56 PM  In your present state of health, do you have any difficulty performing the following activities:  Hearing? 0  Vision? 0  Comment readers  Difficulty concentrating or making decisions? 0  Walking or climbing stairs? 0  Dressing or bathing? 0  Doing errands, shopping? 0  Preparing Food and eating ? N  Using the Toilet? N  In the past six months, have you accidently leaked urine? N  Do you have problems with loss of  bowel control? N  Managing your Medications? N  Managing your Finances? N  Housekeeping or managing your Housekeeping? N    Patient Care Team: Sherlene Shams, MD as PCP - General (Internal Medicine)  Indicate any recent Medical Services you may have received from other than [facility] providers in the past year (date may be approximate).     Assessment:   This is a routine wellness examination for Aislee.  Hearing/Vision screen Hearing Screening - Comments:: No issues Vision Screening - Comments:: glasses   Goals Addressed             This Visit's Progress    Patient Stated       Wants to continue to walk       Depression Screen    09/01/2023    8:25 AM 11/04/2022    8:45 AM 05/01/2022    8:07 AM 04/22/2022   11:27 AM 11/01/2021    8:41 AM 08/10/2021    3:59 PM 08/01/2021    3:08 PM  PHQ 2/9 Scores  PHQ - 2 Score 0 0 0 0 0 0 0  PHQ- 9 Score 0          Fall Risk    08/31/2023   12:56 PM 11/04/2022    8:45 AM 05/01/2022    8:07 AM 04/22/2022   11:26 AM 11/01/2021    8:41 AM  Fall Risk   Falls in the past year? 0 0 0 0 1  Number falls in past yr: 0 0 0 0 0  Injury with Fall? 0 0 0  0  Risk for fall due to : No Fall Risks No Fall Risks No Fall Risks  History of fall(s)  Follow up Falls prevention discussed;Falls evaluation completed Falls evaluation completed Falls evaluation completed Falls evaluation completed Falls evaluation completed    MEDICARE RISK AT HOME: Medicare Risk at Home Any stairs in or around the home?: Yes If so, are there any without handrails?: No Home free of loose throw rugs in walkways, pet beds, electrical cords, etc?: No Adequate lighting in your home to reduce risk of falls?: Yes Life alert?: No Use of a cane, walker or w/c?: No  Grab bars in the bathroom?: Yes Shower chair or bench in shower?: No Elevated toilet seat or a handicapped toilet?: No      Cognitive Function:    09/08/2018    8:25 AM 06/17/2016    9:17 AM  MMSE - Mini Mental  State Exam  Orientation to time 5 5  Orientation to Place 5 5  Registration 3 3  Attention/ Calculation 5 5  Recall 2 3  Language- name 2 objects 2 2  Language- repeat 1 1  Language- follow 3 step command 3 3  Language- read & follow direction 1 1  Write a sentence 1 1  Copy design 1 1  Total score 29 30        09/01/2023    8:32 AM 09/02/2017    8:57 AM  6CIT Screen  What Year? 0 points 0 points  What month? 0 points 0 points  What time? 0 points 0 points  Count back from 20 0 points 0 points  Months in reverse 0 points 0 points  Repeat phrase 0 points 0 points  Total Score 0 points 0 points    Immunizations Immunization History  Administered Date(s) Administered   Fluad Quad(high Dose 65+) 07/02/2019, 08/01/2021, 06/02/2022   Fluad Trivalent(High Dose 65+) 08/15/2023   Influenza Split 07/02/2013, 07/11/2014   Influenza, High Dose Seasonal PF 06/17/2016, 06/26/2017, 07/15/2018, 05/19/2020   Influenza-Unspecified 07/02/2013   PFIZER(Purple Top)SARS-COV-2 Vaccination 10/07/2019, 10/28/2019, 06/27/2020, 05/23/2021   Pfizer(Comirnaty)Fall Seasonal Vaccine 12 years and older 06/24/2022   Pneumococcal Conjugate-13 03/02/2014   Pneumococcal Polysaccharide-23 03/03/2007, 03/19/2018   Rsv, Bivalent, Protein Subunit Rsvpref,pf Verdis Frederickson) 07/30/2022   Tdap 02/25/2009, 05/19/2020   Zoster Recombinant(Shingrix) 12/19/2017, 04/16/2018   Zoster, Live 03/27/2011    TDAP status: Up to date  Flu Vaccine status: Up to date  Pneumococcal vaccine status: Up to date  Covid-19 vaccine status: Information provided on how to obtain vaccines.   Qualifies for Shingles Vaccine? Yes   Zostavax completed Yes   Shingrix Completed?: Yes  Screening Tests Health Maintenance  Topic Date Due   Medicare Annual Wellness (AWV)  04/23/2023   COVID-19 Vaccine (6 - 2023-24 season) 06/01/2023   DTaP/Tdap/Td (3 - Td or Tdap) 05/19/2030   Pneumonia Vaccine 67+ Years old  Completed   INFLUENZA  VACCINE  Completed   DEXA SCAN  Completed   Zoster Vaccines- Shingrix  Completed   HPV VACCINES  Aged Out   Hepatitis C Screening  Discontinued    Health Maintenance  Health Maintenance Due  Topic Date Due   Medicare Annual Wellness (AWV)  04/23/2023   COVID-19 Vaccine (6 - 2023-24 season) 06/01/2023    Colorectal cancer screening: No longer required.   Mammogram status: No longer required due to age and mastectomies.  Bone Density status: Completed 06/2020. Results reflect: Bone density results: OSTEOPENIA. Repeat every 2 years. Will discuss with PCP at next visit  Lung Cancer Screening: (Low Dose CT Chest recommended if Age 36-80 years, 20 pack-year currently smoking OR have quit w/in 15years.) does not qualify.   Additional Screening:  Hepatitis C Screening: does not qualify; Completed 02/2016  Vision Screening: Recommended annual ophthalmology exams for early detection of glaucoma and other disorders of the eye. Is the patient up to date with their annual eye exam?  Yes  Who is the provider or what is the name of the office in which the patient attends annual eye exams? [facility] If pt is not established with a provider, would they  like to be referred to a provider to establish care? Yes .   Dental Screening: Recommended annual dental exams for proper oral hygiene    Community Resource Referral / Chronic Care Management: CRR required this visit?  No   CCM required this visit?  No     Plan:     I have personally reviewed and noted the following in the patient's chart:   Medical and social history Use of alcohol, tobacco or illicit drugs  Current medications and supplements including opioid prescriptions. Patient is not currently taking opioid prescriptions. Functional ability and status Nutritional status Physical activity Advanced directives List of other physicians Hospitalizations, surgeries, and ER visits in previous 12 months Vitals Screenings to  include cognitive, depression, and falls Referrals and appointments  In addition, I have reviewed and discussed with patient certain preventive protocols, quality metrics, and best practice recommendations. A written personalized care plan for preventive services as well as general preventive health recommendations were provided to patient.     Sydell Axon, LPN   [id number]   After Visit Summary: (MyChart) Due to this being a telephonic visit, the after visit summary with patients personalized plan was offered to patient via MyChart   Nurse Notes: None

## 2023-09-03 NOTE — Telephone Encounter (Signed)
noted 

## 2023-09-09 DIAGNOSIS — H353221 Exudative age-related macular degeneration, left eye, with active choroidal neovascularization: Secondary | ICD-10-CM | POA: Diagnosis not present

## 2023-09-12 DIAGNOSIS — D485 Neoplasm of uncertain behavior of skin: Secondary | ICD-10-CM | POA: Diagnosis not present

## 2023-09-12 DIAGNOSIS — L908 Other atrophic disorders of skin: Secondary | ICD-10-CM | POA: Diagnosis not present

## 2023-09-12 DIAGNOSIS — D045 Carcinoma in situ of skin of trunk: Secondary | ICD-10-CM | POA: Diagnosis not present

## 2023-09-12 DIAGNOSIS — L570 Actinic keratosis: Secondary | ICD-10-CM | POA: Diagnosis not present

## 2023-09-12 DIAGNOSIS — D2261 Melanocytic nevi of right upper limb, including shoulder: Secondary | ICD-10-CM | POA: Diagnosis not present

## 2023-09-12 DIAGNOSIS — D2262 Melanocytic nevi of left upper limb, including shoulder: Secondary | ICD-10-CM | POA: Diagnosis not present

## 2023-09-12 DIAGNOSIS — D2272 Melanocytic nevi of left lower limb, including hip: Secondary | ICD-10-CM | POA: Diagnosis not present

## 2023-09-12 DIAGNOSIS — D225 Melanocytic nevi of trunk: Secondary | ICD-10-CM | POA: Diagnosis not present

## 2023-09-12 DIAGNOSIS — Z85828 Personal history of other malignant neoplasm of skin: Secondary | ICD-10-CM | POA: Diagnosis not present

## 2023-09-12 DIAGNOSIS — Z872 Personal history of diseases of the skin and subcutaneous tissue: Secondary | ICD-10-CM | POA: Diagnosis not present

## 2023-09-22 ENCOUNTER — Ambulatory Visit: Payer: Self-pay | Admitting: Internal Medicine

## 2023-09-22 ENCOUNTER — Ambulatory Visit (INDEPENDENT_AMBULATORY_CARE_PROVIDER_SITE_OTHER): Payer: Medicare HMO | Admitting: Family

## 2023-09-22 VITALS — BP 116/70 | HR 89 | Temp 97.9°F | Resp 16 | Ht 63.0 in | Wt 150.0 lb

## 2023-09-22 DIAGNOSIS — J219 Acute bronchiolitis, unspecified: Secondary | ICD-10-CM

## 2023-09-22 DIAGNOSIS — R051 Acute cough: Secondary | ICD-10-CM

## 2023-09-22 DIAGNOSIS — J069 Acute upper respiratory infection, unspecified: Secondary | ICD-10-CM

## 2023-09-22 MED ORDER — PREDNISONE 20 MG PO TABS: 20.0000 mg | ORAL_TABLET | Freq: Every day | ORAL | 0 refills | Status: DC

## 2023-09-22 MED ORDER — AZITHROMYCIN 250 MG PO TABS: ORAL_TABLET | ORAL | 0 refills | Status: AC

## 2023-09-22 NOTE — Telephone Encounter (Signed)
Copied from CRM [id number]. Topic: Clinical - Red Word Triage >> Sep 22, 2023  9:14 AM Isabell A wrote: Red Word that prompted transfer to Nurse Triage: Chest pain    Chief Complaint: cough Symptoms: cough, sore throat, chest discomfort Frequency: worsening since yesterday Pertinent Negatives: Patient denies fever Disposition: [] ED /[] Urgent Care (no appt availability in office) / [x] Appointment(In office/virtual)/ []  [facility]/ [] Home Care/ [] Refused Recommended Disposition /[] [facility]/ []  Follow-up with PCP Additional Notes: patient reports chest discomfort and congestion that started with cough yesterday. Pt denies shortness of breath or cardiac history. Appt scheduled for today at 10:20a    Reason for Disposition  [1] MILD difficulty breathing (e.g., minimal/no SOB at rest, SOB with walking, pulse <100) AND [2] still present when not coughing  Answer Assessment - Initial Assessment Questions 1. ONSET: "When did the cough begin?"      Started 3 days ago  2. SEVERITY: "How bad is the cough today?"      Getting worse, settled into chest  3. SPUTUM: "Describe the color of your sputum" (none, dry cough; clear, white, yellow, green)     Yellowish color  4. HEMOPTYSIS: "Are you coughing up any blood?" If so ask: "How much?" (flecks, streaks, tablespoons, etc.)     No  5. DIFFICULTY BREATHING: "Are you having difficulty breathing?" If Yes, ask: "How bad is it?" (e.g., mild, moderate, severe)    - MILD: No SOB at rest, mild SOB with walking, speaks normally in sentences, can lie down, no retractions, pulse < 100.    - MODERATE: SOB at rest, SOB with minimal exertion and prefers to sit, cannot lie down flat, speaks in phrases, mild retractions, audible wheezing, pulse 100-120.    - SEVERE: Very SOB at rest, speaks in single words, struggling to breathe, sitting hunched forward, retractions, pulse > 120      No Shortness of breath  6. FEVER: "Do you have a  fever?" If Yes, ask: "What is your temperature, how was it measured, and when did it start?"     Low grade fever, 99.40F  7. CARDIAC HISTORY: "Do you have any history of heart disease?" (e.g., heart attack, congestive heart failure)      No cardiac history  8. LUNG HISTORY: "Do you have any history of lung disease?"  (e.g., pulmonary embolus, asthma, emphysema)     No lung history  9. PE RISK FACTORS: "Do you have a history of blood clots?" (or: recent major surgery, recent prolonged travel, bedridden)     No history of clots  10. OTHER SYMPTOMS: "Do you have any other symptoms?" (e.g., runny nose, wheezing, chest pain)       Sore throat, chest discomfort  11. PREGNANCY: "Is there any chance you are pregnant?" "When was your last menstrual period?"       N/A  12. TRAVEL: "Have you traveled out of the country in the last month?" (e.g., travel history, exposures)       Been around some sick last week  Protocols used: Cough - Acute Productive-A-AH

## 2023-09-22 NOTE — Progress Notes (Signed)
   Acute Office Visit  Subjective:     Patient ID: Tammy Tate, female    DOB: [birth year], 82 y.o.   MRN: [id number]  Chief Complaint  Patient presents with   Cough   Chest congestion    HPI Patient is in today with c/o cough, congestion, sore throat, drainage x 5 days. She took a COVID test that was negative. She has been taking Nyquil that has helped.   Review of Systems  Constitutional: Negative.  Negative for chills and fever.  HENT:  Positive for congestion.        Runny nose, drainage  Respiratory:  Positive for cough.   Cardiovascular: Negative.   Musculoskeletal: Negative.   Neurological: Negative.   Psychiatric/Behavioral: Negative.    All other systems reviewed and are negative.       Objective:    BP 116/70   Pulse 89   Temp 97.9 F (36.6 C)   Resp 16   Ht 5\' 3"  (1.6 m)   Wt 150 lb (68 kg)   SpO2 96%   BMI 26.57 kg/m    Physical Exam Vitals and nursing note reviewed.  Constitutional:      Appearance: Normal appearance. She is normal weight.  HENT:     Nose: Congestion and rhinorrhea present.     Mouth/Throat:     Mouth: Mucous membranes are moist.  Cardiovascular:     Rate and Rhythm: Normal rate and regular rhythm.  Pulmonary:     Effort: No respiratory distress.     Breath sounds: Normal breath sounds. No wheezing.  Musculoskeletal:        General: Normal range of motion.     Cervical back: Normal range of motion.  Skin:    General: Skin is warm and dry.  Neurological:     General: No focal deficit present.     Mental Status: She is alert and oriented to person, place, and time. Mental status is at baseline.  Psychiatric:        Mood and Affect: Mood normal.        Behavior: Behavior normal.     No results found for any visits on 09/22/23.      Assessment & Plan:   Problem List Items Addressed This Visit   None Visit Diagnoses       Upper respiratory infection, acute    -  Primary   Relevant Medications   azithromycin  (ZITHROMAX) 250 MG tablet     Acute bronchiolitis, unspecified         Acute cough           Meds ordered this encounter  Medications   predniSONE (DELTASONE) 20 MG tablet    Sig: Take 1 tablet (20 mg total) by mouth daily with breakfast.    Dispense:  5 tablet    Refill:  0   azithromycin (ZITHROMAX) 250 MG tablet    Sig: Take 2 tablets on day 1, then 1 tablet daily on days 2 through 5    Dispense:  6 tablet    Refill:  0   Call the office if symptoms worsen or persist. Recheck as scheduled and sooner as needed.  No follow-ups on file.  Eulis Foster, FNP

## 2023-09-22 NOTE — Telephone Encounter (Signed)
Pt is scheduled with Birdena Crandall, NP today.

## 2023-10-14 DIAGNOSIS — H353221 Exudative age-related macular degeneration, left eye, with active choroidal neovascularization: Secondary | ICD-10-CM | POA: Diagnosis not present

## 2023-10-23 DIAGNOSIS — H5711 Ocular pain, right eye: Secondary | ICD-10-CM | POA: Diagnosis not present

## 2023-10-23 DIAGNOSIS — H2513 Age-related nuclear cataract, bilateral: Secondary | ICD-10-CM | POA: Diagnosis not present

## 2023-10-23 DIAGNOSIS — R208 Other disturbances of skin sensation: Secondary | ICD-10-CM | POA: Diagnosis not present

## 2023-10-23 DIAGNOSIS — D045 Carcinoma in situ of skin of trunk: Secondary | ICD-10-CM | POA: Diagnosis not present

## 2023-10-23 DIAGNOSIS — L820 Inflamed seborrheic keratosis: Secondary | ICD-10-CM | POA: Diagnosis not present

## 2023-11-07 ENCOUNTER — Encounter: Payer: Self-pay | Admitting: Internal Medicine

## 2023-11-07 ENCOUNTER — Ambulatory Visit (INDEPENDENT_AMBULATORY_CARE_PROVIDER_SITE_OTHER): Payer: PPO | Admitting: Internal Medicine

## 2023-11-07 VITALS — BP 116/68 | HR 60 | Ht 61.0 in | Wt 148.6 lb

## 2023-11-07 DIAGNOSIS — D7589 Other specified diseases of blood and blood-forming organs: Secondary | ICD-10-CM

## 2023-11-07 DIAGNOSIS — E538 Deficiency of other specified B group vitamins: Secondary | ICD-10-CM

## 2023-11-07 DIAGNOSIS — Z1211 Encounter for screening for malignant neoplasm of colon: Secondary | ICD-10-CM

## 2023-11-07 DIAGNOSIS — R7301 Impaired fasting glucose: Secondary | ICD-10-CM

## 2023-11-07 DIAGNOSIS — D509 Iron deficiency anemia, unspecified: Secondary | ICD-10-CM | POA: Diagnosis not present

## 2023-11-07 DIAGNOSIS — D126 Benign neoplasm of colon, unspecified: Secondary | ICD-10-CM

## 2023-11-07 DIAGNOSIS — Z780 Asymptomatic menopausal state: Secondary | ICD-10-CM

## 2023-11-07 DIAGNOSIS — F5104 Psychophysiologic insomnia: Secondary | ICD-10-CM

## 2023-11-07 DIAGNOSIS — Z0000 Encounter for general adult medical examination without abnormal findings: Secondary | ICD-10-CM

## 2023-11-07 DIAGNOSIS — E559 Vitamin D deficiency, unspecified: Secondary | ICD-10-CM

## 2023-11-07 DIAGNOSIS — E7800 Pure hypercholesterolemia, unspecified: Secondary | ICD-10-CM | POA: Diagnosis not present

## 2023-11-07 DIAGNOSIS — E034 Atrophy of thyroid (acquired): Secondary | ICD-10-CM | POA: Diagnosis not present

## 2023-11-07 LAB — TSH: TSH: 2.08 u[IU]/mL (ref 0.35–5.50)

## 2023-11-07 LAB — CBC WITH DIFFERENTIAL/PLATELET
Basophils Absolute: 0 10*3/uL (ref 0.0–0.1)
Basophils Relative: 0.9 % (ref 0.0–3.0)
Eosinophils Absolute: 0.2 10*3/uL (ref 0.0–0.7)
Eosinophils Relative: 3.8 % (ref 0.0–5.0)
HCT: 39.5 % (ref 36.0–46.0)
Hemoglobin: 12.9 g/dL (ref 12.0–15.0)
Lymphocytes Relative: 17 % (ref 12.0–46.0)
Lymphs Abs: 1 10*3/uL (ref 0.7–4.0)
MCHC: 32.6 g/dL (ref 30.0–36.0)
MCV: 95.5 fL (ref 78.0–100.0)
Monocytes Absolute: 0.8 10*3/uL (ref 0.1–1.0)
Monocytes Relative: 12.9 % — ABNORMAL HIGH (ref 3.0–12.0)
Neutro Abs: 3.8 10*3/uL (ref 1.4–7.7)
Neutrophils Relative %: 65.4 % (ref 43.0–77.0)
Platelets: 297 10*3/uL (ref 150.0–400.0)
RBC: 4.13 Mil/uL (ref 3.87–5.11)
RDW: 15.6 % — ABNORMAL HIGH (ref 11.5–15.5)
WBC: 5.8 10*3/uL (ref 4.0–10.5)

## 2023-11-07 LAB — COMPREHENSIVE METABOLIC PANEL
ALT: 13 U/L (ref 0–35)
AST: 20 U/L (ref 0–37)
Albumin: 4.2 g/dL (ref 3.5–5.2)
Alkaline Phosphatase: 76 U/L (ref 39–117)
BUN: 21 mg/dL (ref 6–23)
CO2: 28 meq/L (ref 19–32)
Calcium: 9.1 mg/dL (ref 8.4–10.5)
Chloride: 101 meq/L (ref 96–112)
Creatinine, Ser: 0.9 mg/dL (ref 0.40–1.20)
GFR: 59.7 mL/min — ABNORMAL LOW (ref >60.00)
Glucose, Bld: 80 mg/dL (ref 70–99)
Potassium: 4.5 meq/L (ref 3.5–5.1)
Sodium: 140 meq/L (ref 135–145)
Total Bilirubin: 0.5 mg/dL (ref 0.2–1.2)
Total Protein: 6.6 g/dL (ref 6.0–8.3)

## 2023-11-07 LAB — LIPID PANEL
Cholesterol: 214 mg/dL — ABNORMAL HIGH (ref 0–200)
HDL: 109.7 mg/dL (ref >39.00)
LDL Cholesterol: 90 mg/dL (ref 0–99)
NonHDL: 103.87
Total CHOL/HDL Ratio: 2
Triglycerides: 67 mg/dL (ref 0.0–149.0)
VLDL: 13.4 mg/dL (ref 0.0–40.0)

## 2023-11-07 LAB — VITAMIN D 25 HYDROXY (VIT D DEFICIENCY, FRACTURES): VITD: 31.08 ng/mL (ref 30.00–100.00)

## 2023-11-07 LAB — LDL CHOLESTEROL, DIRECT: Direct LDL: 98 mg/dL

## 2023-11-07 LAB — B12 AND FOLATE PANEL
Folate: >25.2 ng/mL (ref >5.9)
Vitamin B-12: 242 pg/mL (ref 211–911)

## 2023-11-07 LAB — HEMOGLOBIN A1C: Hgb A1c MFr Bld: 5.5 % (ref 4.6–6.5)

## 2023-11-07 MED ORDER — OMEPRAZOLE 20 MG PO CPDR: 20.0000 mg | DELAYED_RELEASE_CAPSULE | Freq: Two times a day (BID) | ORAL | 1 refills | Status: AC

## 2023-11-07 MED ORDER — LEVOTHYROXINE SODIUM 88 MCG PO TABS: ORAL_TABLET | ORAL | 1 refills | Status: DC

## 2023-11-07 NOTE — Progress Notes (Signed)
 Patient ID: Tammy Tate, female    DOB: 06/09/1941  Age: 83 y.o. MRN: [id number]  The patient is here for annual preventive examination and management of other chronic and acute problems.   The risk factors are reflected in the social history.   The roster of all physicians providing medical care to patient - is listed in the Snapshot section of the chart.   Activities of daily living:  The patient is 100% independent in all ADLs: dressing, toileting, feeding as well as independent mobility   Home safety : The patient has smoke detectors in the home. They wear seatbelts.  There are no unsecured firearms at home. There is no violence in the home.    There is no risks for hepatitis, STDs or HIV. There is no   history of blood transfusion. They have no travel history to infectious disease endemic areas of the world.   The patient has seen their dentist in the last six month. They have seen their eye doctor in the last year. The patinet  denies slight hearing difficulty with regard to whispered voices and some television programs.  They have deferred audiologic testing in the last year.  They do not  have excessive sun exposure. Discussed the need for sun protection: hats, long sleeves and use of sunscreen if there is significant sun exposure.    Diet: the importance of a healthy diet is discussed. They do have a healthy diet.   The benefits of regular aerobic exercise were discussed. The patient  exercises  3 to 5 days per week  for  60 minutes.    Depression screen: there are no signs or vegative symptoms of depression- irritability, change in appetite, anhedonia, sadness/tearfullness.   The following portions of the patient's history were reviewed and updated as appropriate: allergies, current medications, past family history, past medical history,  past surgical history, past social history  and problem list.   Visual acuity was not assessed per patient preference since the patient has  regular follow up with an  ophthalmologist. Hearing and body mass index were assessed and reviewed.    During the course of the visit the patient was educated and counseled about appropriate screening and preventive services including : fall prevention , diabetes screening, nutrition counseling, colorectal cancer screening, and recommended immunizations.    Chief Complaint:   1) Prolonged URI over holidays requiring 2 rounds of antibiotics , azithromycin  both times,,   with sinus pressure,  eye pressure ,  headache,  eye pressure  treated by [facility],  head still feels congested  despite using  Flonase . Treated second time by [facility] due to eye pressure.    2) Bilateral macular dgeneration:   right  dye is ry ,  left has become wet .  Getting injections by [facility] .  Can still read using the right eye. Left eye is improving .  Initially had a lot of anxiety/depression about the prospect of losing her vision , but has adjusted to the notion   3) STAYING SOCIAL ; BENNETT STILL NOT GETTING OUT ,  WALKING.  SHE HAS 4 TRIPS PLANNED ([place], [place], [place] AND [place] )   Review of Symptoms  Patient denies  fevers, malaise, unintentional weight loss, skin rash, eye pain, sinus congestion and sinus pain, sore throat, dysphagia,  hemoptysis , cough, dyspnea, wheezing, chest pain, palpitations, orthopnea, edema, abdominal pain, nausea, melena, diarrhea, constipation, flank pain, dysuria, hematuria, urinary  Frequency, nocturia, numbness,  tingling, seizures,  Focal weakness, Loss of consciousness,  Tremor, insomnia, depression, anxiety, and suicidal ideation.    Physical Exam:  BP 116/68 (BP Location: Left Arm, Patient Position: Sitting, Cuff Size: Normal)   Pulse 60   Ht 5' 1 (1.549 m)   Wt 148 lb 9.6 oz (67.4 kg)   SpO2 96%   BMI 28.08 kg/m    Physical Exam Vitals reviewed.  Constitutional:      General: She is not in acute distress.    Appearance: Normal appearance. She  is normal weight. She is not ill-appearing, toxic-appearing or diaphoretic.  HENT:     Head: Normocephalic.  Eyes:     General: No scleral icterus.       Right eye: No discharge.        Left eye: No discharge.     Conjunctiva/sclera: Conjunctivae normal.  Cardiovascular:     Rate and Rhythm: Normal rate and regular rhythm.     Heart sounds: Normal heart sounds.  Pulmonary:     Effort: Pulmonary effort is normal. No respiratory distress.     Breath sounds: Normal breath sounds.  Musculoskeletal:        General: Normal range of motion.  Skin:    General: Skin is warm and dry.  Neurological:     General: No focal deficit present.     Mental Status: She is alert and oriented to person, place, and time. Mental status is at baseline.  Psychiatric:        Mood and Affect: Mood normal.        Behavior: Behavior normal.        Thought Content: Thought content normal.        Judgment: Judgment normal.    Assessment and Plan: Hypothyroidism due to acquired atrophy of thyroid  Assessment & Plan: Thyroid  function is normal on 88 mcg levothyroxine  daily .  No changes needed.   Lab Results  Component Value Date   TSH 2.08 11/07/2023  n    Orders: -     TSH  Pure hypercholesterolemia -     Lipid panel -     LDL cholesterol, direct  Iron deficiency anemia, unspecified iron deficiency anemia type Assessment & Plan: Secondary to blood donation .  HGb is now  normal.    Lab Results  Component Value Date   WBC 5.8 11/07/2023   HGB 12.9 11/07/2023   HCT 39.5 11/07/2023   MCV 95.5 11/07/2023   PLT 297.0 11/07/2023     Orders: -     CBC with Differential/Platelet  Impaired fasting glucose -     Comprehensive metabolic panel -     Hemoglobin A1c  B12 deficiency -     B12 and Folate Panel  Vitamin D  deficiency -     VITAMIN D  25 Hydroxy (Vit-D Deficiency, Fractures)  Postmenopausal estrogen deficiency -     DG Bone Density; Future  Chronic insomnia Assessment &  Plan: Managed with ambien   cr.     Macrocytosis without anemia Assessment & Plan: Secondary to recurrent b12 deficiency.   Repeat cbc is normal.  Lab Results  Component Value Date   VITAMINB12 242 11/07/2023      Visit for preventive health examination Assessment & Plan: age appropriate education and counseling updated, referrals for preventative services and immunizations addressed, dietary and smoking counseling addressed, most recent labs reviewed.  I have personally reviewed and have noted:   1) the patient's medical and social history 2) The pt's  use of alcohol, tobacco, and illicit drugs 3) The patient's current medications and supplements 4) Functional ability including ADL's, fall risk, home safety risk, hearing and visual impairment 5) Diet and physical activities 6) Evidence for depression or mood disorder 7) The patient's height, weight, and BMI have been recorded in the chartI have made referrals, and provided counseling and education based on review of the above    Tubular adenoma of colon  Colon cancer screening -     Ambulatory referral to Gastroenterology  Other orders -     Levothyroxine  Sodium; TAKE 1 TABLET BY MOUTH EVERY MORNING ON AN EMPTY STOMACH WITH A FULL GLASS OF WATER. TAKE AT LEAST 30-60 MINUTES BEFORE BREAKFAST.  Dispense: 90 tablet; Refill: 1 -     Omeprazole ; Take 1 capsule (20 mg total) by mouth 2 (two) times daily before a meal.  Dispense: 180 capsule; Refill: 1    No follow-ups on file.  Verneita LITTIE Kettering, MD

## 2023-11-07 NOTE — Patient Instructions (Signed)
  Your  DEXA scan  been ordered.  you are encouraged (required) to call to schedule your own  appointment at [facility]  , and their phone number is [phone]

## 2023-11-08 NOTE — Assessment & Plan Note (Signed)
 Secondary to blood donation .  HGb is now  normal.    Lab Results  Component Value Date   WBC 5.8 11/07/2023   HGB 12.9 11/07/2023   HCT 39.5 11/07/2023   MCV 95.5 11/07/2023   PLT 297.0 11/07/2023

## 2023-11-08 NOTE — Assessment & Plan Note (Signed)

## 2023-11-08 NOTE — Assessment & Plan Note (Signed)
 Secondary to recurrent b12 deficiency.   Repeat cbc is normal.  Lab Results  Component Value Date   VITAMINB12 242 11/07/2023

## 2023-11-08 NOTE — Assessment & Plan Note (Signed)
 Thyroid  function is normal on 88 mcg levothyroxine  daily .  No changes needed.   Lab Results  Component Value Date   TSH 2.08 11/07/2023  n

## 2023-11-08 NOTE — Assessment & Plan Note (Signed)
 Managed with ambien   cr.

## 2023-11-09 ENCOUNTER — Encounter: Payer: Self-pay | Admitting: Internal Medicine

## 2023-11-09 DIAGNOSIS — D126 Benign neoplasm of colon, unspecified: Secondary | ICD-10-CM

## 2023-11-17 ENCOUNTER — Telehealth: Payer: Self-pay

## 2023-11-17 NOTE — Telephone Encounter (Signed)
 Is this patient supposed to get at repeat Colonoscopy?  Patient had a EGD on 12/04/2021 and the last colonoscopy was 06/10/2019. I don't where Dr Johnney Killian recommended for patient.

## 2023-11-17 NOTE — Telephone Encounter (Signed)
 Pt requesting call back to schedule colonoscopy.

## 2023-11-19 NOTE — Telephone Encounter (Signed)
 Called patient back and let her know what Dr. Tobi Bastos had stated in a detailed message since I had to leave her a voicemail.  Per Dr. Johnney Killian recommendation: Inform we usually do not routinely screen above age of 81 and do so only on a case to case basis , if she wishes to discuss about it would be happy to see her and discuss at non urgent appointment.  KA This is as per usptf recs [email or web address]

## 2023-11-19 NOTE — Telephone Encounter (Signed)
 Messaged Dr. Tobi Bastos via secure chat asking if patient is needing to have a colonoscopy since her last one he did not state to repeat. Waiting on his response so I could call the patient to let her know.

## 2023-11-21 ENCOUNTER — Ambulatory Visit
Admission: RE | Admit: 2023-11-21 | Discharge: 2023-11-21 | Disposition: A | Discharge status: Home / Self Care | Payer: PPO | Source: Ambulatory Visit | Attending: Internal Medicine | Admitting: Internal Medicine

## 2023-11-21 DIAGNOSIS — Z780 Asymptomatic menopausal state: Secondary | ICD-10-CM | POA: Diagnosis not present

## 2023-11-21 DIAGNOSIS — M85851 Other specified disorders of bone density and structure, right thigh: Secondary | ICD-10-CM | POA: Diagnosis not present

## 2023-11-24 NOTE — Assessment & Plan Note (Signed)
 Dr Darien Ramus has declined follow up colonoscopy due to patient's age

## 2023-11-25 DIAGNOSIS — H353221 Exudative age-related macular degeneration, left eye, with active choroidal neovascularization: Secondary | ICD-10-CM | POA: Diagnosis not present

## 2023-11-26 ENCOUNTER — Encounter: Payer: Self-pay | Admitting: Internal Medicine

## 2023-12-15 DIAGNOSIS — H353221 Exudative age-related macular degeneration, left eye, with active choroidal neovascularization: Secondary | ICD-10-CM | POA: Diagnosis not present

## 2023-12-15 DIAGNOSIS — H353112 Nonexudative age-related macular degeneration, right eye, intermediate dry stage: Secondary | ICD-10-CM | POA: Diagnosis not present

## 2023-12-15 DIAGNOSIS — H2513 Age-related nuclear cataract, bilateral: Secondary | ICD-10-CM | POA: Diagnosis not present

## 2023-12-18 DIAGNOSIS — H353221 Exudative age-related macular degeneration, left eye, with active choroidal neovascularization: Secondary | ICD-10-CM | POA: Diagnosis not present

## 2023-12-18 DIAGNOSIS — H2513 Age-related nuclear cataract, bilateral: Secondary | ICD-10-CM | POA: Diagnosis not present

## 2023-12-18 DIAGNOSIS — H353112 Nonexudative age-related macular degeneration, right eye, intermediate dry stage: Secondary | ICD-10-CM | POA: Diagnosis not present

## 2023-12-18 DIAGNOSIS — H04123 Dry eye syndrome of bilateral lacrimal glands: Secondary | ICD-10-CM | POA: Diagnosis not present

## 2023-12-23 DIAGNOSIS — M47896 Other spondylosis, lumbar region: Secondary | ICD-10-CM | POA: Diagnosis not present

## 2023-12-23 DIAGNOSIS — M5416 Radiculopathy, lumbar region: Secondary | ICD-10-CM | POA: Diagnosis not present

## 2024-01-13 DIAGNOSIS — M5416 Radiculopathy, lumbar region: Secondary | ICD-10-CM | POA: Diagnosis not present

## 2024-01-20 DIAGNOSIS — H353221 Exudative age-related macular degeneration, left eye, with active choroidal neovascularization: Secondary | ICD-10-CM | POA: Diagnosis not present

## 2024-02-02 ENCOUNTER — Encounter: Payer: Self-pay | Admitting: Ophthalmology

## 2024-02-02 DIAGNOSIS — H5213 Myopia, bilateral: Secondary | ICD-10-CM | POA: Diagnosis not present

## 2024-02-02 DIAGNOSIS — H2511 Age-related nuclear cataract, right eye: Secondary | ICD-10-CM | POA: Diagnosis not present

## 2024-02-05 DIAGNOSIS — M5416 Radiculopathy, lumbar region: Secondary | ICD-10-CM | POA: Diagnosis not present

## 2024-02-05 DIAGNOSIS — M7918 Myalgia, other site: Secondary | ICD-10-CM | POA: Diagnosis not present

## 2024-02-05 DIAGNOSIS — M47896 Other spondylosis, lumbar region: Secondary | ICD-10-CM | POA: Diagnosis not present

## 2024-02-10 NOTE — Anesthesia Preprocedure Evaluation (Signed)
 Anesthesia Evaluation  Patient identified by MRN, date of birth, ID band Patient awake    Reviewed: Allergy & Precautions, H&P , NPO status , Patient's Chart, lab work & pertinent test results  Airway Mallampati: III       Dental   Has a few caps and crowns, and some permanent upper bridges:   Pulmonary neg pulmonary ROS, former smoker          Cardiovascular negative cardio ROS      Neuro/Psych  Headaches PSYCHIATRIC DISORDERS      negative neurological ROS  negative psych ROS   GI/Hepatic negative GI ROS, Neg liver ROS,GERD  ,,  Endo/Other  negative endocrine ROSHypothyroidism    Renal/GU negative Renal ROS  negative genitourinary   Musculoskeletal negative musculoskeletal ROS (+) Arthritis ,    Abdominal   Peds negative pediatric ROS (+)  Hematology negative hematology ROS (+) Blood dyscrasia, anemia   Anesthesia Other Findings  Thyroid  disease  B12 deficiency  Allergy GERD (gastroesophageal reflux disease) Vasomotor rhinitis Bronchitis  Degenerative disk disease Degenerative arthritis of lumbar spine Migraine with aura Cancer Anemia    Reproductive/Obstetrics negative OB ROS                             Anesthesia Physical Anesthesia Plan  ASA: 3  Anesthesia Plan: General   Post-op Pain Management:    Induction: Intravenous  PONV Risk Score and Plan:   Airway Management Planned: Natural Airway and Nasal Cannula  Additional Equipment:   Intra-op Plan:   Post-operative Plan:   Informed Consent: I have reviewed the patients History and Physical, chart, labs and discussed the procedure including the risks, benefits and alternatives for the proposed anesthesia with the patient or authorized representative who has indicated his/her understanding and acceptance.     Dental Advisory Given  Plan Discussed with: Anesthesiologist, CRNA and Surgeon  Anesthesia Plan  Comments: (Patient consented for risks of anesthesia including but not limited to:  - adverse reactions to medications - risk of airway placement if required - damage to eyes, teeth, lips or other oral mucosa - nerve damage due to positioning  - sore throat or hoarseness - Damage to heart, brain, nerves, lungs, other parts of body or loss of life  Patient voiced understanding and assent.)       Anesthesia Quick Evaluation

## 2024-02-10 NOTE — Discharge Instructions (Signed)

## 2024-02-11 ENCOUNTER — Ambulatory Visit: Payer: Self-pay | Admitting: Anesthesiology

## 2024-02-11 ENCOUNTER — Encounter: Payer: Self-pay | Admitting: Ophthalmology

## 2024-02-11 ENCOUNTER — Ambulatory Visit
Admission: RE | Admit: 2024-02-11 | Discharge: 2024-02-11 | Disposition: A | Discharge status: Home / Self Care | Attending: Ophthalmology | Admitting: Ophthalmology

## 2024-02-11 ENCOUNTER — Encounter
Admission: RE | Disposition: A | Discharge status: Home / Self Care | Payer: Self-pay | Source: Home / Self Care | Attending: Ophthalmology

## 2024-02-11 ENCOUNTER — Other Ambulatory Visit: Payer: Self-pay

## 2024-02-11 DIAGNOSIS — H2512 Age-related nuclear cataract, left eye: Secondary | ICD-10-CM | POA: Diagnosis not present

## 2024-02-11 DIAGNOSIS — K219 Gastro-esophageal reflux disease without esophagitis: Secondary | ICD-10-CM | POA: Diagnosis not present

## 2024-02-11 DIAGNOSIS — Z87891 Personal history of nicotine dependence: Secondary | ICD-10-CM | POA: Insufficient documentation

## 2024-02-11 DIAGNOSIS — E039 Hypothyroidism, unspecified: Secondary | ICD-10-CM | POA: Insufficient documentation

## 2024-02-11 DIAGNOSIS — E785 Hyperlipidemia, unspecified: Secondary | ICD-10-CM | POA: Diagnosis not present

## 2024-02-11 HISTORY — PX: CATARACT EXTRACTION W/PHACO: SHX586

## 2024-02-11 HISTORY — DX: Anemia, unspecified: D64.9

## 2024-02-11 SURGERY — PHACOEMULSIFICATION, CATARACT, WITH IOL INSERTION
Anesthesia: General | Site: Eye | Laterality: Left

## 2024-02-11 MED ORDER — TETRACAINE HCL 0.5 % OP SOLN
OPHTHALMIC | Status: AC
  Filled 2024-02-11: qty 4

## 2024-02-11 MED ORDER — FENTANYL CITRATE (PF) 100 MCG/2ML IJ SOLN
INTRAMUSCULAR | Status: DC | PRN
  Administered 2024-02-11: 50 ug via INTRAVENOUS

## 2024-02-11 MED ORDER — SIGHTPATH DOSE#1 BSS IO SOLN
INTRAOCULAR | Status: DC | PRN
  Administered 2024-02-11: 70 mL via OPHTHALMIC

## 2024-02-11 MED ORDER — LIDOCAINE HCL (PF) 2 % IJ SOLN
INTRAOCULAR | Status: DC | PRN
  Administered 2024-02-11: 2 mL

## 2024-02-11 MED ORDER — SIGHTPATH DOSE#1 NA HYALUR & NA CHOND-NA HYALUR IO KIT
PACK | INTRAOCULAR | Status: DC | PRN
  Administered 2024-02-11: 1 via OPHTHALMIC

## 2024-02-11 MED ORDER — CEFUROXIME OPHTHALMIC INJECTION 1 MG/0.1 ML
INJECTION | OPHTHALMIC | Status: DC | PRN
Start: 2024-02-11 — End: 2024-02-11
  Administered 2024-02-11: .1 mL via INTRACAMERAL

## 2024-02-11 MED ORDER — ARMC OPHTHALMIC DILATING DROPS
1.0000 | OPHTHALMIC | Status: DC | PRN
  Administered 2024-02-11 (×3): 1 via OPHTHALMIC

## 2024-02-11 MED ORDER — MIDAZOLAM HCL 2 MG/2ML IJ SOLN
INTRAMUSCULAR | Status: DC | PRN
  Administered 2024-02-11: 1 mg via INTRAVENOUS

## 2024-02-11 MED ORDER — SIGHTPATH DOSE#1 BSS IO SOLN
INTRAOCULAR | Status: DC | PRN
  Administered 2024-02-11: 15 mL via INTRAOCULAR

## 2024-02-11 MED ORDER — BRIMONIDINE TARTRATE-TIMOLOL 0.2-0.5 % OP SOLN
OPHTHALMIC | Status: DC | PRN
  Administered 2024-02-11: 1 [drp] via OPHTHALMIC

## 2024-02-11 MED ORDER — FENTANYL CITRATE (PF) 100 MCG/2ML IJ SOLN
INTRAMUSCULAR | Status: AC
  Filled 2024-02-11: qty 2

## 2024-02-11 MED ORDER — TETRACAINE HCL 0.5 % OP SOLN
1.0000 [drp] | OPHTHALMIC | Status: DC | PRN
  Administered 2024-02-11 (×3): 1 [drp] via OPHTHALMIC

## 2024-02-11 MED ORDER — MIDAZOLAM HCL 2 MG/2ML IJ SOLN
INTRAMUSCULAR | Status: AC
  Filled 2024-02-11: qty 2

## 2024-02-11 MED ORDER — ARMC OPHTHALMIC DILATING DROPS
OPHTHALMIC | Status: AC
  Filled 2024-02-11: qty 0.5

## 2024-02-11 SURGICAL SUPPLY — 10 items
CATARACT SUITE SIGHTPATH (MISCELLANEOUS) ×1 IMPLANT
FEE CATARACT SUITE SIGHTPATH (MISCELLANEOUS) ×1 IMPLANT
GLOVE BIOGEL PI IND STRL 8 (GLOVE) ×1 IMPLANT
GLOVE SURG LX STRL 7.5 STRW (GLOVE) ×1 IMPLANT
GLOVE SURG PROTEXIS BL SZ6.5 (GLOVE) ×1 IMPLANT
GLOVE SURG SYN 6.5 PF PI BL (GLOVE) ×1 IMPLANT
LENS IOL TECNIS EYHANCE 24.0 (Intraocular Lens) IMPLANT
NDL FILTER BLUNT 18X1 1/2 (NEEDLE) ×1 IMPLANT
NEEDLE FILTER BLUNT 18X1 1/2 (NEEDLE) ×1 IMPLANT
SYR 3ML LL SCALE MARK (SYRINGE) ×1 IMPLANT

## 2024-02-11 NOTE — H&P (Signed)
 [name]    Primary Care Physician:  Thersia Flax, MD Ophthalmologist: Dr. Annell Kidney  Pre-Procedure History & Physical: HPI:  Tammy Tate is a 83 y.o. female here for ophthalmic surgery.   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 , right   Chicken pox    Degenerative arthritis of lumbar spine    Degenerative disk disease    KAM 08-04-2007   GERD (gastroesophageal reflux disease)    Migraine with aura    Thyroid  disease    Vasomotor rhinitis     Past Surgical History:  Procedure Laterality Date   ABDOMINAL HYSTERECTOMY     with prior HRT   APPENDECTOMY     COLONOSCOPY WITH PROPOFOL  N/A 06/10/2019   Procedure: COLONOSCOPY WITH PROPOFOL ;  Surgeon: Luke Salaam, MD;  Location: [facility] ENDOSCOPY;  Service: Gastroenterology;  Laterality: N/A;   ESOPHAGOGASTRODUODENOSCOPY (EGD) WITH PROPOFOL  N/A 06/10/2019   Procedure: ESOPHAGOGASTRODUODENOSCOPY (EGD) WITH PROPOFOL ;  Surgeon: Luke Salaam, MD;  Location: [facility] ENDOSCOPY;  Service: Gastroenterology;  Laterality: N/A;   ESOPHAGOGASTRODUODENOSCOPY (EGD) WITH PROPOFOL  N/A 12/04/2021   Procedure: ESOPHAGOGASTRODUODENOSCOPY (EGD) WITH PROPOFOL ;  Surgeon: Luke Salaam, MD;  Location: [facility] END[facility]COPY;  Service: Gastroenterology;  Laterality: N/A;   MODIFIED RADICAL MASTECTOMY W/ AXILLARY LYMPH NODE DISSECTION W/ PECTORALIS MINOR PRESERVATION  2010   bilateral, for DCIS,  Sheril Dines , [facility]    Prior to Admission medications   Medication Sig Start Date End Date Taking? Authorizing Provider  Calcium -Magnesium-Vitamin D  (CALCIUM  1200+D3 PO) Take 1 tablet by mouth. Ca 1200mg   D3   Yes [provider]  ferrous sulfate 324 MG TBEC Take 324 mg by mouth once a week.   Yes [provider]  levothyroxine  (SYNTHROID ) 88 MCG tablet TAKE 1 TABLET BY MOUTH EVERY MORNING ON AN EMPTY STOMACH WITH A  FULL GLASS OF WATER. TAKE AT LEAST 30-60 MINUTES BEFORE BREAKFAST. 11/07/23  Yes Thersia Flax, MD  Multiple Vitamins-Minerals (PRESERVISION AREDS 2 PO)  11/02/18  Yes [provider]  Omega-3 Fatty Acids (OMEGA 3 PO) Take by mouth daily.   Yes [provider]  omeprazole  (PRILOSEC) 20 MG capsule Take 1 capsule (20 mg total) by mouth 2 (two) times daily before a meal. 11/07/23  Yes Thersia Flax, MD  Turmeric 500 MG CAPS Take 2 capsules by mouth daily.    Yes [provider]  vitamin B-12 (CYANOCOBALAMIN ) 1000 MCG tablet Take 3,000 mcg by mouth daily.   Yes [provider]  zolpidem  (AMBIEN  CR) 6.25 MG CR tablet Take 1 tablet (6.25 mg total) by mouth at bedtime as needed for sleep. 05/07/23  Yes Thersia Flax, MD  predniSONE  (DELTASONE ) 20 MG tablet Take 1 tablet (20 mg total) by mouth daily with breakfast. Patient not taking: Reported on 02/02/2024 09/22/23   Jarold Merlin B, FNP    Allergies as of 12/22/2023 - Review Complete 11/07/2023  Allergen Reaction Noted   Atorvastatin  Other (See Comments) 11/01/2021    Family History  Problem Relation Age of Onset   Cancer Mother 66       esophgeal CA mets to lungs, spine    Stroke Father 59       tobacco abuse, hyperlipidemia   Hyperlipidemia Father    Heart disease Father    Diabetes Maternal Grandfather  Pancreatitis Daughter     Social History   Socioeconomic History   Marital status: Married    Spouse name: Not on file   Number of children: Not on file   Years of education: Not on file   Highest education level: Not on file  Occupational History   Not on file  Tobacco Use   Smoking status: Former    Current packs/day: 0.00    Types: Cigarettes    Quit date: 10/23/1971    Years since quitting: 52.3   Smokeless tobacco: Never  Vaping Use   Vaping status: Never Used  Substance and Sexual Activity   Alcohol use: Yes    Alcohol/week: 7.0 standard drinks of alcohol    Types: 7 Glasses of wine  per week    Comment: occasional   Drug use: No   Sexual activity: Not Currently  Other Topics Concern   Not on file  Social History Narrative   married   Social Drivers of Corporate investment banker Strain: Low Risk  (08/31/2023)   Overall Financial Resource Strain (CARDIA)    Difficulty of Paying Living Expenses: Not hard at all  Food Insecurity: No Food Insecurity (08/31/2023)   Hunger Vital Sign    Worried About Running Out of Food in the Last Year: Never true    Ran Out of Food in the Last Year: Never true  Transportation Needs: No Transportation Needs (08/31/2023)   PRAPARE - Administrator, Civil Service (Medical): No    Lack of Transportation (Non-Medical): No  Physical Activity: Insufficiently Active (08/31/2023)   Exercise Vital Sign    Days of Exercise per Week: 3 days    Minutes of Exercise per Session: 30 min  Stress: No Stress Concern Present (08/31/2023)   [employer] of Occupational Health - Occupational Stress Questionnaire    Feeling of Stress : Not at all  Social Connections: Socially Integrated (08/31/2023)   Social Connection and Isolation Panel [NHANES]    Frequency of Communication with Friends and Family: More than three times a week    Frequency of Social Gatherings with Friends and Family: More than three times a week    Attends Religious Services: More than 4 times per year    Active Member of [employer] or Organizations: Yes    Attends Engineer, structural: More than 4 times per year    Marital Status: Married  Catering manager Violence: Not At Risk (09/01/2023)   Humiliation, Afraid, Rape, and Kick questionnaire    Fear of Current or Ex-Partner: No    Emotionally Abused: No    Physically Abused: No    Sexually Abused: No    Review of Systems: See HPI, otherwise negative ROS  Physical Exam: BP 130/72   Pulse (!) 55   Resp 15   Ht 5\' 2"  (1.575 m)   Wt 66.3 kg   SpO2 100%   BMI 26.72 kg/m  General:   Alert,  pleasant and  cooperative in NAD Head:  Normocephalic and atraumatic. Lungs:  Clear to auscultation.    Heart:  Regular rate and rhythm.   Impression/Plan: [name] is here for ophthalmic surgery.  Risks, benefits, limitations, and alternatives regarding ophthalmic surgery have been reviewed with the patient.  Questions have been answered.  All parties agreeable.   Annell Kidney, MD  02/11/2024, 8:30 AM

## 2024-02-11 NOTE — Transfer of Care (Signed)
 Immediate Anesthesia Transfer of Care Note  Patient: Tammy Tate  Procedure(s) Performed: PHACOEMULSIFICATION, CATARACT, WITH IOL INSERTION 5.76 00:40.4 (Left: Eye)  Patient Location: PACU  Anesthesia Type: General  Level of Consciousness: awake, alert  and patient cooperative  Airway and Oxygen Therapy: Patient Spontanous Breathing and Patient connected to supplemental oxygen  Post-op Assessment: Post-op Vital signs reviewed, Patient's Cardiovascular Status Stable, Respiratory Function Stable, Patent Airway and No signs of Nausea or vomiting  Post-op Vital Signs: Reviewed and stable  Complications: No notable events documented.

## 2024-02-11 NOTE — Anesthesia Postprocedure Evaluation (Signed)
 Anesthesia Post Note  Patient: Tammy Tate  Procedure(s) Performed: PHACOEMULSIFICATION, CATARACT, WITH IOL INSERTION 5.76 00:40.4 (Left: Eye)  Patient location during evaluation: PACU Anesthesia Type: General Level of consciousness: awake and alert Pain management: pain level controlled Vital Signs Assessment: post-procedure vital signs reviewed and stable Respiratory status: spontaneous breathing, nonlabored ventilation, respiratory function stable and patient connected to nasal cannula oxygen Cardiovascular status: stable and blood pressure returned to baseline Postop Assessment: no apparent nausea or vomiting Anesthetic complications: no   No notable events documented.   Last Vitals:  Vitals:   02/11/24 0942 02/11/24 0949  BP: 113/64 123/67  Pulse: (!) 52 62  Resp: 15 13  Temp: (!) 36.2 C (!) 36.2 C  SpO2: 100% 97%    Last Pain:  Vitals:   02/11/24 0949  TempSrc:   PainSc: 0-No pain                 Emilie Harden

## 2024-02-11 NOTE — Op Note (Signed)
 OPERATIVE NOTE  Tammy Tate [id number] 02/11/2024   PREOPERATIVE DIAGNOSIS:  Nuclear sclerotic cataract left eye. H25.12   POSTOPERATIVE DIAGNOSIS:    Nuclear sclerotic cataract left eye.     PROCEDURE:  Phacoemusification with posterior chamber intraocular lens placement of the left eye  Ultrasound time: Procedure(s): PHACOEMULSIFICATION, CATARACT, WITH IOL INSERTION 5.76 00:40.4 (Left)  LENS:   Implant Name Type Inv. Item Serial No. Manufacturer Lot No. LRB No. Used Action  LENS IOL TECNIS EYHANCE 24.0 - [id number] Intraocular Lens LENS IOL TECNIS EYHANCE 24.0 [id number] [employer]  Left 1 Implanted      SURGEON:  Berline Brenner, MD   ANESTHESIA:  Topical with tetracaine drops and 2% Xylocaine  jelly, augmented with 1% preservative-free intracameral lidocaine .    COMPLICATIONS:  None.   DESCRIPTION OF PROCEDURE:  The patient was identified in the holding room and transported to the operating room and placed in the supine position under the operating microscope.  The left eye was identified as the operative eye and it was prepped and draped in the usual sterile ophthalmic fashion.   A 1 millimeter clear-corneal paracentesis was made at the 1:30 position.  0.5 ml of preservative-free 1% lidocaine  was injected into the anterior chamber.  The anterior chamber was filled with Viscoat viscoelastic.  A 2.4 millimeter keratome was used to make a near-clear corneal incision at the 10:30 position.  .  A curvilinear capsulorrhexis was made with a cystotome and capsulorrhexis forceps.  Balanced salt solution was used to hydrodissect and hydrodelineate the nucleus.   Phacoemulsification was then used in stop and chop fashion to remove the lens nucleus and epinucleus.  The remaining cortex was then removed using the irrigation and aspiration handpiece. Provisc was then placed into the capsular bag to distend it for lens placement.  A lens was then injected into the capsular bag.  The  remaining viscoelastic was aspirated.   Wounds were hydrated with balanced salt solution.  The anterior chamber was inflated to a physiologic pressure with balanced salt solution.  No wound leaks were noted. Cefuroxime 0.1 ml of a 10mg /ml solution was injected into the anterior chamber for a dose of 1 mg of intracameral antibiotic at the completion of the case.   Timolol and Brimonidine drops were applied to the eye.  The patient was taken to the recovery room in stable condition without complications of anesthesia or surgery.  Tammy Tate 02/11/2024, 9:40 AM

## 2024-02-12 ENCOUNTER — Encounter: Payer: Self-pay | Admitting: Ophthalmology

## 2024-02-12 DIAGNOSIS — H2511 Age-related nuclear cataract, right eye: Secondary | ICD-10-CM | POA: Diagnosis not present

## 2024-02-16 NOTE — Anesthesia Preprocedure Evaluation (Addendum)
 Anesthesia Evaluation  Patient identified by MRN, date of birth, ID band Patient awake    Reviewed: Allergy & Precautions, H&P , NPO status , Patient's Chart, lab work & pertinent test results  Airway Mallampati: III  TM Distance: >3 FB Neck ROM: Full    Dental no notable dental hx. (+) Caps Has a few caps and crowns, and some permanent upper bridges:   Pulmonary neg pulmonary ROS, former smoker   Pulmonary exam normal breath sounds clear to auscultation       Cardiovascular negative cardio ROS Normal cardiovascular exam Rhythm:Regular Rate:Normal     Neuro/Psych negative neurological ROS  negative psych ROS   GI/Hepatic negative GI ROS, Neg liver ROS,,,  Endo/Other  negative endocrine ROS    Renal/GU negative Renal ROS  negative genitourinary   Musculoskeletal negative musculoskeletal ROS (+)    Abdominal   Peds negative pediatric ROS (+)  Hematology negative hematology ROS (+)   Anesthesia Other Findings Previous cataract surgery 02-10-24 Dr. Aldo Amble   Thyroid  disease            B12 deficiency        Allergy GERD (gastroesophageal reflux disease) Vasomotor rhinitis Bronchitis             Degenerative disk disease Degenerative arthritis of lumbar spine Migraine with aura Cancer Anemia   Reproductive/Obstetrics negative OB ROS                             Anesthesia Physical Anesthesia Plan  ASA: 3  Anesthesia Plan: MAC   Post-op Pain Management:    Induction: Intravenous  PONV Risk Score and Plan:   Airway Management Planned: Natural Airway and Nasal Cannula  Additional Equipment:   Intra-op Plan:   Post-operative Plan:   Informed Consent: I have reviewed the patients History and Physical, chart, labs and discussed the procedure including the risks, benefits and alternatives for the proposed anesthesia with the patient or authorized representative who has indicated  his/her understanding and acceptance.     Dental Advisory Given  Plan Discussed with: Anesthesiologist, CRNA and Surgeon  Anesthesia Plan Comments: (Patient consented for risks of anesthesia including but not limited to:  - adverse reactions to medications - damage to eyes, teeth, lips or other oral mucosa - nerve damage due to positioning  - sore throat or hoarseness - Damage to heart, brain, nerves, lungs, other parts of body or loss of life  Patient voiced understanding and assent.)        Anesthesia Quick Evaluation

## 2024-03-02 NOTE — Discharge Instructions (Signed)

## 2024-03-03 ENCOUNTER — Ambulatory Visit: Payer: Self-pay | Admitting: Anesthesiology

## 2024-03-03 ENCOUNTER — Other Ambulatory Visit: Payer: Self-pay

## 2024-03-03 ENCOUNTER — Encounter
Admission: RE | Disposition: A | Discharge status: Home / Self Care | Payer: Self-pay | Source: Ambulatory Visit | Attending: Ophthalmology

## 2024-03-03 ENCOUNTER — Encounter: Payer: Self-pay | Admitting: Ophthalmology

## 2024-03-03 ENCOUNTER — Ambulatory Visit
Admission: RE | Admit: 2024-03-03 | Discharge: 2024-03-03 | Disposition: A | Discharge status: Home / Self Care | Source: Ambulatory Visit | Attending: Ophthalmology | Admitting: Ophthalmology

## 2024-03-03 DIAGNOSIS — Z87891 Personal history of nicotine dependence: Secondary | ICD-10-CM | POA: Insufficient documentation

## 2024-03-03 DIAGNOSIS — H2511 Age-related nuclear cataract, right eye: Secondary | ICD-10-CM | POA: Insufficient documentation

## 2024-03-03 DIAGNOSIS — H5703 Miosis: Secondary | ICD-10-CM | POA: Diagnosis not present

## 2024-03-03 DIAGNOSIS — K219 Gastro-esophageal reflux disease without esophagitis: Secondary | ICD-10-CM | POA: Diagnosis not present

## 2024-03-03 HISTORY — PX: CATARACT EXTRACTION W/PHACO: SHX586

## 2024-03-03 SURGERY — PHACOEMULSIFICATION, CATARACT, WITH IOL INSERTION
Anesthesia: Monitor Anesthesia Care | Site: Eye | Laterality: Right

## 2024-03-03 MED ORDER — SIGHTPATH DOSE#1 BSS IO SOLN
INTRAOCULAR | Status: DC | PRN
  Administered 2024-03-03: 15 mL via INTRAOCULAR

## 2024-03-03 MED ORDER — MIDAZOLAM HCL 2 MG/2ML IJ SOLN
INTRAMUSCULAR | Status: AC
  Filled 2024-03-03: qty 2

## 2024-03-03 MED ORDER — BRIMONIDINE TARTRATE-TIMOLOL 0.2-0.5 % OP SOLN
OPHTHALMIC | Status: DC | PRN
  Administered 2024-03-03: 1 [drp] via OPHTHALMIC

## 2024-03-03 MED ORDER — ARMC OPHTHALMIC DILATING DROPS
OPHTHALMIC | Status: AC
  Filled 2024-03-03: qty 0.5

## 2024-03-03 MED ORDER — FENTANYL CITRATE (PF) 100 MCG/2ML IJ SOLN
INTRAMUSCULAR | Status: AC
  Filled 2024-03-03: qty 2

## 2024-03-03 MED ORDER — LIDOCAINE HCL (PF) 2 % IJ SOLN
INTRAOCULAR | Status: DC | PRN
  Administered 2024-03-03: 2 mL

## 2024-03-03 MED ORDER — CEFUROXIME OPHTHALMIC INJECTION 1 MG/0.1 ML
INJECTION | OPHTHALMIC | Status: DC | PRN
Start: 2024-03-03 — End: 2024-03-03
  Administered 2024-03-03: .1 mL via INTRACAMERAL

## 2024-03-03 MED ORDER — TETRACAINE HCL 0.5 % OP SOLN
1.0000 [drp] | OPHTHALMIC | Status: DC | PRN
  Administered 2024-03-03 (×3): 1 [drp] via OPHTHALMIC

## 2024-03-03 MED ORDER — SIGHTPATH DOSE#1 BSS IO SOLN
INTRAOCULAR | Status: DC | PRN
  Administered 2024-03-03: 60 mL via OPHTHALMIC

## 2024-03-03 MED ORDER — FENTANYL CITRATE (PF) 100 MCG/2ML IJ SOLN
INTRAMUSCULAR | Status: DC | PRN
  Administered 2024-03-03: 50 ug via INTRAVENOUS

## 2024-03-03 MED ORDER — TETRACAINE HCL 0.5 % OP SOLN
OPHTHALMIC | Status: AC
  Filled 2024-03-03: qty 4

## 2024-03-03 MED ORDER — ARMC OPHTHALMIC DILATING DROPS
1.0000 | OPHTHALMIC | Status: DC | PRN
  Administered 2024-03-03 (×3): 1 via OPHTHALMIC

## 2024-03-03 MED ORDER — MIDAZOLAM HCL 2 MG/2ML IJ SOLN
INTRAMUSCULAR | Status: DC | PRN
  Administered 2024-03-03: 1 mg via INTRAVENOUS

## 2024-03-03 MED ORDER — SIGHTPATH DOSE#1 NA HYALUR & NA CHOND-NA HYALUR IO KIT
PACK | INTRAOCULAR | Status: DC | PRN
  Administered 2024-03-03: 1 via OPHTHALMIC

## 2024-03-03 SURGICAL SUPPLY — 11 items
CATARACT SUITE SIGHTPATH (MISCELLANEOUS) ×1 IMPLANT
FEE CATARACT SUITE SIGHTPATH (MISCELLANEOUS) ×1 IMPLANT
GLOVE BIOGEL PI IND STRL 8 (GLOVE) ×1 IMPLANT
GLOVE SURG LX STRL 7.5 STRW (GLOVE) ×1 IMPLANT
GLOVE SURG PROTEXIS BL SZ6.5 (GLOVE) ×1 IMPLANT
GLOVE SURG SYN 6.5 PF PI BL (GLOVE) ×1 IMPLANT
LENS IOL TECNIS EYHANCE 23.5 (Intraocular Lens) IMPLANT
NDL FILTER BLUNT 18X1 1/2 (NEEDLE) ×1 IMPLANT
NEEDLE FILTER BLUNT 18X1 1/2 (NEEDLE) ×1 IMPLANT
RING MALYGIN 7.0 (MISCELLANEOUS) IMPLANT
SYR 3ML LL SCALE MARK (SYRINGE) ×1 IMPLANT

## 2024-03-03 NOTE — H&P (Signed)
 [facility]   Primary Care Physician:  Thersia Flax, MD Ophthalmologist: Dr. Annell Kidney  Pre-Procedure History & Physical: HPI:  Tammy Tate is a 83 y.o. female here for ophthalmic surgery.   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 , right   Chicken pox    Degenerative arthritis of lumbar spine    Degenerative disk disease    KAM 08-04-2007   GERD (gastroesophageal reflux disease)    Migraine with aura    Thyroid  disease    Vasomotor rhinitis     Past Surgical History:  Procedure Laterality Date   ABDOMINAL HYSTERECTOMY     with prior HRT   APPENDECTOMY     CATARACT EXTRACTION W/PHACO Left 02/11/2024   Procedure: PHACOEMULSIFICATION, CATARACT, WITH IOL INSERTION 5.76 00:40.4;  Surgeon: Annell Kidney, MD;  Location: [facility];  Service: Ophthalmology;  Laterality: Left;   COLONOSCOPY WITH PROPOFOL  N/A 06/10/2019   Procedure: COLONOSCOPY WITH PROPOFOL ;  Surgeon: Luke Salaam, MD;  Location: [facility];  Service: Gastroenterology;  Laterality: N/A;   ESOPHAGOGASTRODUODENOSCOPY (EGD) WITH PROPOFOL  N/A 06/10/2019   Procedure: ESOPHAGOGASTRODUODENOSCOPY (EGD) WITH PROPOFOL ;  Surgeon: Luke Salaam, MD;  Location: [facility] ENDOSCOPY;  Service: Gastroenterology;  Laterality: N/A;   ESOPHAGOGASTRODUODENOSCOPY (EGD) WITH PROPOFOL  N/A 12/04/2021   Procedure: ESOPHAGOGASTRODUODENOSCOPY (EGD) WITH PROPOFOL ;  Surgeon: Luke Salaam, MD;  Location: [facility] ENDOSCOPY;  Service: Gastroenterology;  Laterality: N/A;   MODIFIED RADICAL MASTECTOMY W/ AXILLARY LYMPH NODE DISSECTION W/ PECTORALIS MINOR PRESERVATION  2010   bilateral, for DCIS,  Sheril Dines , [facility]    Prior to Admission medications   Medication Sig Start Date End Date Taking? Authorizing Provider  Calcium -Magnesium-Vitamin D  (CALCIUM  1200+D3 PO) Take 1 tablet by mouth. Ca 1200mg   D3    Yes [provider]  ferrous sulfate 324 MG TBEC Take 324 mg by mouth once a week.   Yes [provider]  levothyroxine  (SYNTHROID ) 88 MCG tablet TAKE 1 TABLET BY MOUTH EVERY MORNING ON AN EMPTY STOMACH WITH A FULL GLASS OF WATER. TAKE AT LEAST 30-60 MINUTES BEFORE BREAKFAST. 11/07/23  Yes Thersia Flax, MD  Multiple Vitamins-Minerals (PRESERVISION AREDS 2 PO)  11/02/18  Yes [provider]  Omega-3 Fatty Acids (OMEGA 3 PO) Take by mouth daily.   Yes [provider]  omeprazole  (PRILOSEC) 20 MG capsule Take 1 capsule (20 mg total) by mouth 2 (two) times daily before a meal. 11/07/23  Yes Thersia Flax, MD  Turmeric 500 MG CAPS Take 2 capsules by mouth daily.    Yes [provider]  vitamin B-12 (CYANOCOBALAMIN ) 1000 MCG tablet Take 3,000 mcg by mouth daily.   Yes [provider]  zolpidem  (AMBIEN  CR) 6.25 MG CR tablet Take 1 tablet (6.25 mg total) by mouth at bedtime as needed for sleep. 05/07/23  Yes Thersia Flax, MD  predniSONE  (DELTASONE ) 20 MG tablet Take 1 tablet (20 mg total) by mouth daily with breakfast. Patient not taking: Reported on 02/02/2024 09/22/23   Webb, Padonda B, FNP    Allergies as of 12/22/2023 - Review Complete 11/07/2023  Allergen Reaction Noted   Atorvastatin  Other (See Comments) 11/01/2021    Family History  Problem Relation Age of Onset   Cancer Mother 68       esophgeal CA mets to lungs, spine  Stroke Father 63       tobacco abuse, hyperlipidemia   Hyperlipidemia Father    Heart disease Father    Diabetes Maternal Grandfather    Pancreatitis Daughter     Social History   Socioeconomic History   Marital status: Married    Spouse name: Not on file   Number of children: Not on file   Years of education: Not on file   Highest education level: Not on file  Occupational History   Not on file  Tobacco Use   Smoking status: Former    Current packs/day: 0.00    Types: Cigarettes    Quit date:  10/23/1971    Years since quitting: 52.3   Smokeless tobacco: Never  Vaping Use   Vaping status: Never Used  Substance and Sexual Activity   Alcohol use: Yes    Alcohol/week: 7.0 standard drinks of alcohol    Types: 7 Glasses of wine per week    Comment: occasional   Drug use: No   Sexual activity: Not Currently  Other Topics Concern   Not on file  Social History Narrative   married   Social Drivers of Corporate investment banker Strain: Low Risk  (08/31/2023)   Overall Financial Resource Strain (CARDIA)    Difficulty of Paying Living Expenses: Not hard at all  Food Insecurity: No Food Insecurity (08/31/2023)   Hunger Vital Sign    Worried About Running Out of Food in the Last Year: Never true    Ran Out of Food in the Last Year: Never true  Transportation Needs: No Transportation Needs (08/31/2023)   PRAPARE - Administrator, Civil Service (Medical): No    Lack of Transportation (Non-Medical): No  Physical Activity: Insufficiently Active (08/31/2023)   Exercise Vital Sign    Days of Exercise per Week: 3 days    Minutes of Exercise per Session: 30 min  Stress: No Stress Concern Present (08/31/2023)   [employer] of Occupational Health - Occupational Stress Questionnaire    Feeling of Stress : Not at all  Social Connections: Socially Integrated (08/31/2023)   Social Connection and Isolation Panel [NHANES]    Frequency of Communication with Friends and Family: More than three times a week    Frequency of Social Gatherings with Friends and Family: More than three times a week    Attends Religious Services: More than 4 times per year    Active Member of [employer] or Organizations: Yes    Attends Engineer, structural: More than 4 times per year    Marital Status: Married  Catering manager Violence: Not At Risk (09/01/2023)   Humiliation, Afraid, Rape, and Kick questionnaire    Fear of Current or Ex-Partner: No    Emotionally Abused: No    Physically Abused:  No    Sexually Abused: No    Review of Systems: See HPI, otherwise negative ROS  Physical Exam: BP 134/71   Pulse 60   Temp 98 F (36.7 C) (Temporal)   Wt 64.9 kg   SpO2 100%   BMI 26.16 kg/m  General:   Alert,  pleasant and cooperative in NAD Head:  Normocephalic and atraumatic. Lungs:  Clear to auscultation.    Heart:  Regular rate and rhythm.   Impression/Plan: Tammy Tate is here for ophthalmic surgery.  Risks, benefits, limitations, and alternatives regarding ophthalmic surgery have been reviewed with the patient.  Questions have been answered.  All parties agreeable.   Annell Kidney,  MD  03/03/2024, 9:12 AM

## 2024-03-03 NOTE — Transfer of Care (Signed)
 Immediate Anesthesia Transfer of Care Note  Patient: Tammy Tate  Procedure(s) Performed: PHACOEMULSIFICATION, CATARACT, WITH IOL INSERTION (Right)  Patient Location: PACU  Anesthesia Type: MAC  Level of Consciousness: awake, alert  and patient cooperative  Airway and Oxygen Therapy: Patient Spontanous Breathing and Patient connected to supplemental oxygen  Post-op Assessment: Post-op Vital signs reviewed, Patient's Cardiovascular Status Stable, Respiratory Function Stable, Patent Airway and No signs of Nausea or vomiting  Post-op Vital Signs: Reviewed and stable  Complications: No notable events documented.

## 2024-03-03 NOTE — Anesthesia Postprocedure Evaluation (Signed)
 Anesthesia Post Note  Patient: Tammy Tate  Procedure(s) Performed: PHACOEMULSIFICATION, CATARACT, WITH IOL INSERTION 5.46 00:35.1 (Right: Eye)  Patient location during evaluation: PACU Anesthesia Type: MAC Level of consciousness: awake and alert Pain management: pain level controlled Vital Signs Assessment: post-procedure vital signs reviewed and stable Respiratory status: spontaneous breathing, nonlabored ventilation, respiratory function stable and patient connected to nasal cannula oxygen Cardiovascular status: stable and blood pressure returned to baseline Postop Assessment: no apparent nausea or vomiting Anesthetic complications: no   No notable events documented.   Last Vitals:  Vitals:   03/03/24 0958 03/03/24 1002  BP: 119/63   Pulse: (!) 53   Resp: 10   Temp: 36.7 C 36.7 C  SpO2: 97%     Last Pain:  Vitals:   03/03/24 1002  TempSrc:   PainSc: 0-No pain                 [name]

## 2024-03-03 NOTE — Op Note (Signed)
 OPERATIVE NOTE  Tammy Tate [id number] 03/03/2024   PREOPERATIVE DIAGNOSIS:    Nuclear Sclerotic Cataract Right eye with miotic pupil.        H25.11  POSTOPERATIVE DIAGNOSIS: Nuclear Sclerotic Cataract Right eye with miotic pupil.          PROCEDURE:  Phacoemusification with posterior chamber intraocular lens placement of the right eye which required pupil stretching with the Malyugin pupil expansion device. Ultrasound time: Procedure(s): PHACOEMULSIFICATION, CATARACT, WITH IOL INSERTION 5.46 00:35.1 (Right)  LENS:   Implant Name Type Inv. Item Serial No. Manufacturer Lot No. LRB No. Used Action  LENS IOL TECNIS EYHANCE 23.5 - [id number] Intraocular Lens LENS IOL TECNIS EYHANCE 23.5 [id number] [employer]  Right 1 Implanted      SURGEON:  Berline Brenner, MD   ANESTHESIA:  Topical with tetracaine  drops and 2% Xylocaine  jelly, augmented with 1% preservative-free intracameral lidocaine .   COMPLICATIONS:  None.   DESCRIPTION OF PROCEDURE:  The patient was identified in the holding room and transported to the operating room and placed in the supine position under the operating microscope. The right eye was identified as the operative eye and it was prepped and draped in the usual sterile ophthalmic fashion.   A 1 millimeter clear-corneal paracentesis was made at the 12:00 position.  0.5 ml of preservative-free 1% lidocaine  was injected into the anterior chamber. The anterior chamber was filled with Viscoat viscoelastic.  A 2.4 millimeter keratome was used to make a near-clear corneal incision at the 9:00 position. A Malyugin pupil expander was then placed through the main incision and into the anterior chamber of the eye.  The edge of the iris was secured on the lip of the pupil expander and it was released, thereby expanding the pupil to approximately 7 millimeters for completion of the cataract surgery.  Additional Viscoat was placed in the anterior chamber.  A cystotome and  capsulorrhexis forceps were used to make a curvilinear capsulorrhexis.   Balanced salt  solution was used to hydrodissect and hydrodelineate the lens nucleus.   Phacoemulsification was used in stop and chop fashion to remove the lens, nucleus and epinucleus.  The remaining cortex was aspirated using the irrigation aspiration handpiece.  Additional Provisc was placed into the eye to distend the capsular bag for lens placement.  A lens was then injected into the capsular bag.  The pupil expanding ring was removed using a Kuglen hook and insertion device. The remaining viscoelastic was aspirated from the capsular bag and the anterior chamber.  The anterior chamber was filled with balanced salt  solution to inflate to a physiologic pressure.  Wounds were hydrated with balanced salt  solution.  The anterior chamber was inflated to a physiologic pressure with balanced salt  solution.  No wound leaks were noted.Cefuroxime  0.1 ml of a 10mg /ml solution was injected into the anterior chamber for a dose of 1 mg of intracameral antibiotic at the completion of the case. Timolol  and Brimonidine  drops were applied to the eye.  The patient was taken to the recovery room in stable condition without complications of anesthesia or surgery.  [name] 03/03/2024, 9:55 AM

## 2024-03-04 ENCOUNTER — Encounter: Payer: Self-pay | Admitting: Ophthalmology

## 2024-03-15 DIAGNOSIS — M47896 Other spondylosis, lumbar region: Secondary | ICD-10-CM | POA: Diagnosis not present

## 2024-03-15 DIAGNOSIS — M5416 Radiculopathy, lumbar region: Secondary | ICD-10-CM | POA: Diagnosis not present

## 2024-03-15 DIAGNOSIS — M7918 Myalgia, other site: Secondary | ICD-10-CM | POA: Diagnosis not present

## 2024-03-23 DIAGNOSIS — Z961 Presence of intraocular lens: Secondary | ICD-10-CM | POA: Diagnosis not present

## 2024-03-23 DIAGNOSIS — H353221 Exudative age-related macular degeneration, left eye, with active choroidal neovascularization: Secondary | ICD-10-CM | POA: Diagnosis not present

## 2024-03-23 DIAGNOSIS — H353112 Nonexudative age-related macular degeneration, right eye, intermediate dry stage: Secondary | ICD-10-CM | POA: Diagnosis not present

## 2024-04-06 DIAGNOSIS — H353221 Exudative age-related macular degeneration, left eye, with active choroidal neovascularization: Secondary | ICD-10-CM | POA: Diagnosis not present

## 2024-04-28 DIAGNOSIS — D225 Melanocytic nevi of trunk: Secondary | ICD-10-CM | POA: Diagnosis not present

## 2024-04-28 DIAGNOSIS — D2261 Melanocytic nevi of right upper limb, including shoulder: Secondary | ICD-10-CM | POA: Diagnosis not present

## 2024-04-28 DIAGNOSIS — D2272 Melanocytic nevi of left lower limb, including hip: Secondary | ICD-10-CM | POA: Diagnosis not present

## 2024-04-28 DIAGNOSIS — D2262 Melanocytic nevi of left upper limb, including shoulder: Secondary | ICD-10-CM | POA: Diagnosis not present

## 2024-04-28 DIAGNOSIS — Z85828 Personal history of other malignant neoplasm of skin: Secondary | ICD-10-CM | POA: Diagnosis not present

## 2024-04-28 DIAGNOSIS — L570 Actinic keratosis: Secondary | ICD-10-CM | POA: Diagnosis not present

## 2024-05-06 DIAGNOSIS — M47896 Other spondylosis, lumbar region: Secondary | ICD-10-CM | POA: Diagnosis not present

## 2024-05-06 DIAGNOSIS — M5416 Radiculopathy, lumbar region: Secondary | ICD-10-CM | POA: Diagnosis not present

## 2024-05-06 DIAGNOSIS — M7918 Myalgia, other site: Secondary | ICD-10-CM | POA: Diagnosis not present

## 2024-05-06 DIAGNOSIS — M461 Sacroiliitis, not elsewhere classified: Secondary | ICD-10-CM | POA: Diagnosis not present

## 2024-05-31 ENCOUNTER — Other Ambulatory Visit: Payer: Self-pay | Admitting: Internal Medicine

## 2024-06-09 DIAGNOSIS — M8580 Other specified disorders of bone density and structure, unspecified site: Secondary | ICD-10-CM | POA: Diagnosis not present

## 2024-06-09 DIAGNOSIS — M5134 Other intervertebral disc degeneration, thoracic region: Secondary | ICD-10-CM | POA: Diagnosis not present

## 2024-06-09 DIAGNOSIS — M419 Scoliosis, unspecified: Secondary | ICD-10-CM | POA: Diagnosis not present

## 2024-06-09 DIAGNOSIS — M5030 Other cervical disc degeneration, unspecified cervical region: Secondary | ICD-10-CM | POA: Diagnosis not present

## 2024-06-09 DIAGNOSIS — E43 Unspecified severe protein-calorie malnutrition: Secondary | ICD-10-CM | POA: Diagnosis not present

## 2024-06-09 DIAGNOSIS — M549 Dorsalgia, unspecified: Secondary | ICD-10-CM | POA: Diagnosis not present

## 2024-06-09 DIAGNOSIS — Z8673 Personal history of transient ischemic attack (TIA), and cerebral infarction without residual deficits: Secondary | ICD-10-CM | POA: Diagnosis not present

## 2024-06-09 DIAGNOSIS — G4700 Insomnia, unspecified: Secondary | ICD-10-CM | POA: Diagnosis not present

## 2024-06-09 DIAGNOSIS — G8929 Other chronic pain: Secondary | ICD-10-CM | POA: Diagnosis not present

## 2024-06-09 DIAGNOSIS — Z7189 Other specified counseling: Secondary | ICD-10-CM | POA: Diagnosis not present

## 2024-06-09 DIAGNOSIS — E039 Hypothyroidism, unspecified: Secondary | ICD-10-CM | POA: Diagnosis not present

## 2024-06-09 DIAGNOSIS — Z780 Asymptomatic menopausal state: Secondary | ICD-10-CM | POA: Diagnosis not present

## 2024-06-09 DIAGNOSIS — E538 Deficiency of other specified B group vitamins: Secondary | ICD-10-CM | POA: Diagnosis not present

## 2024-06-09 DIAGNOSIS — M51369 Other intervertebral disc degeneration, lumbar region without mention of lumbar back pain or lower extremity pain: Secondary | ICD-10-CM | POA: Diagnosis not present

## 2024-06-09 DIAGNOSIS — M5135 Other intervertebral disc degeneration, thoracolumbar region: Secondary | ICD-10-CM | POA: Diagnosis not present

## 2024-06-09 DIAGNOSIS — Z23 Encounter for immunization: Secondary | ICD-10-CM | POA: Diagnosis not present

## 2024-06-09 DIAGNOSIS — K219 Gastro-esophageal reflux disease without esophagitis: Secondary | ICD-10-CM | POA: Diagnosis not present

## 2024-06-22 DIAGNOSIS — Z961 Presence of intraocular lens: Secondary | ICD-10-CM | POA: Diagnosis not present

## 2024-06-22 DIAGNOSIS — H43813 Vitreous degeneration, bilateral: Secondary | ICD-10-CM | POA: Diagnosis not present

## 2024-06-22 DIAGNOSIS — H353221 Exudative age-related macular degeneration, left eye, with active choroidal neovascularization: Secondary | ICD-10-CM | POA: Diagnosis not present

## 2024-06-22 DIAGNOSIS — H353112 Nonexudative age-related macular degeneration, right eye, intermediate dry stage: Secondary | ICD-10-CM | POA: Diagnosis not present

## 2024-07-16 DIAGNOSIS — Z961 Presence of intraocular lens: Secondary | ICD-10-CM | POA: Diagnosis not present

## 2024-07-16 DIAGNOSIS — H353112 Nonexudative age-related macular degeneration, right eye, intermediate dry stage: Secondary | ICD-10-CM | POA: Diagnosis not present

## 2024-07-16 DIAGNOSIS — H353221 Exudative age-related macular degeneration, left eye, with active choroidal neovascularization: Secondary | ICD-10-CM | POA: Diagnosis not present

## 2024-07-16 DIAGNOSIS — H43813 Vitreous degeneration, bilateral: Secondary | ICD-10-CM | POA: Diagnosis not present

## 2024-07-19 DIAGNOSIS — H353211 Exudative age-related macular degeneration, right eye, with active choroidal neovascularization: Secondary | ICD-10-CM | POA: Diagnosis not present

## 2024-07-19 DIAGNOSIS — H353221 Exudative age-related macular degeneration, left eye, with active choroidal neovascularization: Secondary | ICD-10-CM | POA: Diagnosis not present

## 2024-07-19 DIAGNOSIS — Z961 Presence of intraocular lens: Secondary | ICD-10-CM | POA: Diagnosis not present

## 2024-07-19 DIAGNOSIS — H353112 Nonexudative age-related macular degeneration, right eye, intermediate dry stage: Secondary | ICD-10-CM | POA: Diagnosis not present

## 2024-08-12 DIAGNOSIS — H353221 Exudative age-related macular degeneration, left eye, with active choroidal neovascularization: Secondary | ICD-10-CM | POA: Diagnosis not present

## 2024-08-12 DIAGNOSIS — Z961 Presence of intraocular lens: Secondary | ICD-10-CM | POA: Diagnosis not present

## 2024-08-12 DIAGNOSIS — H26491 Other secondary cataract, right eye: Secondary | ICD-10-CM | POA: Diagnosis not present

## 2024-08-18 DIAGNOSIS — M7041 Prepatellar bursitis, right knee: Secondary | ICD-10-CM | POA: Diagnosis not present

## 2024-08-24 DIAGNOSIS — H353211 Exudative age-related macular degeneration, right eye, with active choroidal neovascularization: Secondary | ICD-10-CM | POA: Diagnosis not present

## 2024-08-28 ENCOUNTER — Other Ambulatory Visit: Payer: Self-pay | Admitting: Internal Medicine

## 2024-09-07 DIAGNOSIS — H353221 Exudative age-related macular degeneration, left eye, with active choroidal neovascularization: Secondary | ICD-10-CM | POA: Diagnosis not present

## 2024-10-05 ENCOUNTER — Ambulatory Visit (INDEPENDENT_AMBULATORY_CARE_PROVIDER_SITE_OTHER)

## 2024-10-05 ENCOUNTER — Ambulatory Visit: Payer: Self-pay

## 2024-10-05 VITALS — BP 108/68 | HR 67 | Temp 98.1°F | Wt 150.9 lb

## 2024-10-05 DIAGNOSIS — U071 COVID-19: Secondary | ICD-10-CM | POA: Insufficient documentation

## 2024-10-05 MED ORDER — NIRMATRELVIR/RITONAVIR (PAXLOVID) TABLET (RENAL DOSING): 2.0000 | ORAL_TABLET | Freq: Two times a day (BID) | ORAL | 0 refills | Status: AC

## 2024-10-05 NOTE — Progress Notes (Signed)
 "   Acute visit   Patient: Tammy Tate   DOB: 1941/01/27   84 y.o. Female  MRN: [id number] PCP: Marylynn Verneita CROME, MD   Chief Complaint  Patient presents with   flu like symptoms    Coughing  Body aches and joints No nausea or vomiting  Symptoms started yesterday Some diarrhea Has a combination flu and covid test.   Subjective    Discussed the use of AI scribe software for clinical note transcription with the patient, who gave verbal consent to proceed.  History of Present Illness Tammy Tate is an 84 year old female who presents with symptoms suggestive of COVID-19.  Symptoms began yesterday, including a cough with yellow sputum, body aches, wooziness, diarrhea, nasal congestion, and sinus pressure. She has been taking DayQuil and Mucinex to manage her symptoms. Denies shortness of breath, fevers.  She has a history of receiving COVID-19 vaccinations but did not receive the most recent booster. She has never taken Paxlovid  before and has not previously suspected having COVID-19. She is concerned about potentially having COVID-19 due to her husband's health conditions, including COPD and recent knee surgery.  Her current medications include Synthroid  and Prilosec. She occasionally takes Aleve for spinal stenosis, degenerative disc disease, bursitis, and arthritis.   Review of systems as noted in HPI.   Objective    BP 108/68   Pulse 67   Temp 98.1 F (36.7 C) (Oral)   Wt 150 lb 14.4 oz (68.4 kg)   SpO2 97%   BMI 27.60 kg/m  Physical Exam Constitutional:      Appearance: Normal appearance.  HENT:     Head: Normocephalic and atraumatic.     Nose: Congestion present.     Mouth/Throat:     Mouth: Mucous membranes are moist.  Eyes:     Pupils: Pupils are equal, round, and reactive to light.  Cardiovascular:     Rate and Rhythm: Normal rate and regular rhythm.     Pulses: Normal pulses.     Heart sounds: Normal heart sounds.  Pulmonary:     Effort:  Pulmonary effort is normal. No respiratory distress.     Breath sounds: No wheezing, rhonchi or rales.  Skin:    General: Skin is warm.  Neurological:     General: No focal deficit present.     Mental Status: She is alert.       No results found for any visits on 10/05/24.  Assessment & Plan     Problem List Items Addressed This Visit       Other   COVID-19 - Primary   Relevant Medications   nirmatrelvir /ritonavir , renal dosing, (PAXLOVID ) 10 x 150 MG & 10 x 100MG  TABS   Assessment & Plan COVID-19 infection Acute COVID-19 confirmed by positive test in clinic today. Symptoms include cough, headache, body aches, diarrhea, and nasal congestion. Has received 5 COVID vaccines. Candidate for Paxlovid . - Prescribed Paxlovid , adjusted for renal function, for 5 days. - Continue Mucinex for cough. - Use tylenol or Aleve for aches and pains as needed, with food. - Recommend increased fluid intake, rest - Monitor symptoms and self-care.   Meds ordered this encounter  Medications   nirmatrelvir /ritonavir , renal dosing, (PAXLOVID ) 10 x 150 MG & 10 x 100MG  TABS    Sig: Take 2 tablets by mouth 2 (two) times daily for 5 days. (Take nirmatrelvir  150 mg one tablet twice daily for 5 days and ritonavir  100 mg one tablet twice daily  for 5 days) Patient GFR is 56.    Dispense:  20 tablet    Refill:  0     No follow-ups on file.      Isaiah DELENA Pepper, MD  [facility] [phone] (phone) [phone] (fax) "

## 2024-10-05 NOTE — Telephone Encounter (Signed)
 Pt was seen today at [facility]. Spoke with pt about his symptoms and he stated that they were going to see if he could be seen at [facility] as well but if not he would give us  a call back to see about scheduling an appointment.

## 2024-10-05 NOTE — Telephone Encounter (Signed)
 FYI Only or Action Required?: FYI only for provider: appointment scheduled on 10/05/24.  Patient was last seen in primary care on 11/07/2023 by Marylynn Verneita CROME, MD.  Called Nurse Triage reporting Cough and Generalized Body Aches.  Symptoms began yesterday.  Interventions attempted: OTC medications: nyquil, mucinex and Rest, hydration, or home remedies.  Symptoms are: gradually worsening.  Triage Disposition: Call PCP Within 24 Hours  Patient/caregiver understands and will follow disposition?: Yes Copied from CRM #[id number]. Topic: Clinical - Red Word Triage >> Oct 05, 2024  8:11 AM Antony RAMAN wrote: Red Word that prompted transfer to Nurse Triage: hurting all over, breathing trouble, diarrhea and coughing up yellow Reason for Disposition  Patient is HIGH RISK (e.g., 65 years and older, pregnant, HIV+, or chronic medical condition)  Answer Assessment - Initial Assessment Questions Starting yesterday- 1/5- didn't feel great coming back from the beach, body aches. Productive cough started overnight- yellow thick mucous. Body aches all over, low grade fever to touch. Face full and congestion, with ear congestion. Voice almost gone. Mild diarrhea.   Chest tightness with coughing and deep breath, feels week after a coughing fit.   Denies dizziness, SOB, CP, abdominal pain. Took Nyquil last night and will take Mucinex today.   Appt with [facility] to assess. Address given- ED/UC precautions understood.    1. SYMPTOMS: What is your main symptom or concern? (e.g., cough, fever, shortness of breath, muscle aches)     Productive Cough, hoarse voice, body aches, low grade fever, mild diarrhea 2. ONSET: When did the symptoms start?      yesterday 3. COUGH: Do you have a cough? If Yes, ask: How bad is the cough?       Yes- productive thick yellow gunk 4. FEVER: Do you have a fever? If Yes, ask: What is your temperature, how was it measured, and when did it start?     Hasn't measured-  5.  BREATHING DIFFICULTY: Are you having any difficulty breathing? (e.g., normal; shortness of breath, wheezing, unable to speak)      cough 6. BETTER-SAME-WORSE: Are you getting better, staying the same or getting worse compared to yesterday?  If getting worse, ask, In what way?     worse 7. OTHER SYMPTOMS: Do you have any other symptoms?  (e.g., chills, fatigue, headache, loss of smell or taste, muscle pain, sore throat)     Fatigue, body aches, low grade fever 8. INFLUENZA EXPOSURE: Was there any known exposure to influenza (flu) before the symptoms began?      Unsure but a friend was sick who came to her birthday 34. INFLUENZA SUSPECTED: Why do you think you have influenza? (e.g., positive flu self-test at home, symptoms after exposure).     symptoms 10. INFLUENZA VACCINE: Have you had the flu vaccine? If Yes, ask: When did you last get it?       Has gotten this year 69. HIGH RISK FOR COMPLICATIONS: Do you have any chronic medical problems? (e.g., asthma, heart or lung disease, obesity, weak immune system)       allergies 13. O2 SATURATION MONITOR:  Do you use an oxygen saturation monitor (pulse oximeter) at home? If Yes, ask What is your reading (oxygen level) today? What is your usual oxygen saturation reading? (e.g., 95%)       96% with HR 87  Protocols used: Influenza (Flu) Suspected-A-AH
